# Patient Record
Sex: Female | Born: 1980 | Race: Black or African American | Hispanic: No | Marital: Married | Smoking: Never smoker
Health system: Southern US, Community
[De-identification: ages and names within clinical notes are randomized; demographics above are authoritative.]

## PROBLEM LIST (undated history)

## (undated) DIAGNOSIS — I10 Essential (primary) hypertension: Secondary | ICD-10-CM

## (undated) DIAGNOSIS — M069 Rheumatoid arthritis, unspecified: Secondary | ICD-10-CM

## (undated) DIAGNOSIS — R531 Weakness: Secondary | ICD-10-CM

## (undated) DIAGNOSIS — M329 Systemic lupus erythematosus, unspecified: Secondary | ICD-10-CM

## (undated) DIAGNOSIS — M0579 Rheumatoid arthritis with rheumatoid factor of multiple sites without organ or systems involvement: Secondary | ICD-10-CM

## (undated) DIAGNOSIS — I2699 Other pulmonary embolism without acute cor pulmonale: Secondary | ICD-10-CM

## (undated) DIAGNOSIS — F329 Major depressive disorder, single episode, unspecified: Secondary | ICD-10-CM

## (undated) DIAGNOSIS — I729 Aneurysm of unspecified site: Secondary | ICD-10-CM

## (undated) DIAGNOSIS — S065XAA Traumatic subdural hemorrhage with loss of consciousness status unknown, initial encounter: Secondary | ICD-10-CM

## (undated) DIAGNOSIS — G43909 Migraine, unspecified, not intractable, without status migrainosus: Secondary | ICD-10-CM

## (undated) DIAGNOSIS — Z8679 Personal history of other diseases of the circulatory system: Secondary | ICD-10-CM

## (undated) DIAGNOSIS — M797 Fibromyalgia: Secondary | ICD-10-CM

## (undated) DIAGNOSIS — D649 Anemia, unspecified: Secondary | ICD-10-CM

## (undated) DIAGNOSIS — M1990 Unspecified osteoarthritis, unspecified site: Secondary | ICD-10-CM

## (undated) HISTORY — DX: Aneurysm of unspecified site: I72.9

## (undated) HISTORY — PX: CHOLECYSTECTOMY: SHX55

## (undated) HISTORY — DX: Personal history of other diseases of the circulatory system: Z86.79

## (undated) HISTORY — DX: Rheumatoid arthritis with rheumatoid factor of multiple sites without organ or systems involvement: M05.79

## (undated) HISTORY — PX: BRAIN SURGERY: SHX531

## (undated) HISTORY — DX: Major depressive disorder, single episode, unspecified: F32.9

## (undated) HISTORY — DX: Weakness: R53.1

## (undated) HISTORY — DX: Rheumatoid arthritis, unspecified: M06.9

## (undated) HISTORY — DX: Traumatic subdural hemorrhage with loss of consciousness status unknown, initial encounter: S06.5XAA

## (undated) HISTORY — PX: ABLATION: SHX5711

## (undated) HISTORY — PX: DILATION AND CURETTAGE OF UTERUS: SHX78

## (undated) HISTORY — PX: TUBAL LIGATION: SHX77

## (undated) HISTORY — DX: Migraine, unspecified, not intractable, without status migrainosus: G43.909

---

## 2003-05-25 ENCOUNTER — Inpatient Hospital Stay (HOSPITAL_COMMUNITY): Admission: AD | Admit: 2003-05-25 | Discharge: 2003-05-25 | Payer: Self-pay | Admitting: Family Medicine

## 2003-05-27 ENCOUNTER — Inpatient Hospital Stay (HOSPITAL_COMMUNITY): Admission: AD | Admit: 2003-05-27 | Discharge: 2003-05-27 | Payer: Self-pay | Admitting: Obstetrics and Gynecology

## 2003-06-08 ENCOUNTER — Inpatient Hospital Stay (HOSPITAL_COMMUNITY): Admission: AD | Admit: 2003-06-08 | Discharge: 2003-06-08 | Payer: Self-pay | Admitting: *Deleted

## 2007-02-28 DIAGNOSIS — I2699 Other pulmonary embolism without acute cor pulmonale: Secondary | ICD-10-CM

## 2007-02-28 HISTORY — DX: Other pulmonary embolism without acute cor pulmonale: I26.99

## 2008-03-30 DIAGNOSIS — I10 Essential (primary) hypertension: Secondary | ICD-10-CM

## 2008-03-30 DIAGNOSIS — M329 Systemic lupus erythematosus, unspecified: Secondary | ICD-10-CM

## 2008-03-30 HISTORY — DX: Systemic lupus erythematosus, unspecified: M32.9

## 2008-03-30 HISTORY — DX: Essential (primary) hypertension: I10

## 2008-04-27 DIAGNOSIS — F5101 Primary insomnia: Secondary | ICD-10-CM

## 2008-04-27 HISTORY — DX: Primary insomnia: F51.01

## 2008-12-20 ENCOUNTER — Encounter: Admission: RE | Admit: 2008-12-20 | Discharge: 2008-12-20 | Payer: Self-pay | Admitting: Gastroenterology

## 2009-01-11 ENCOUNTER — Ambulatory Visit (HOSPITAL_COMMUNITY): Admission: RE | Admit: 2009-01-11 | Discharge: 2009-01-11 | Payer: Self-pay | Admitting: General Surgery

## 2009-01-29 ENCOUNTER — Inpatient Hospital Stay (HOSPITAL_COMMUNITY): Admission: EM | Admit: 2009-01-29 | Discharge: 2009-02-12 | Payer: Self-pay | Admitting: Emergency Medicine

## 2009-02-01 ENCOUNTER — Encounter (INDEPENDENT_AMBULATORY_CARE_PROVIDER_SITE_OTHER): Payer: Self-pay

## 2009-02-15 DIAGNOSIS — D62 Acute posthemorrhagic anemia: Secondary | ICD-10-CM

## 2009-02-15 DIAGNOSIS — D6869 Other thrombophilia: Secondary | ICD-10-CM | POA: Insufficient documentation

## 2009-02-15 DIAGNOSIS — Z9049 Acquired absence of other specified parts of digestive tract: Secondary | ICD-10-CM

## 2009-02-15 HISTORY — DX: Acquired absence of other specified parts of digestive tract: Z90.49

## 2009-02-15 HISTORY — DX: Other thrombophilia: D68.69

## 2009-02-15 HISTORY — DX: Acute posthemorrhagic anemia: D62

## 2009-03-05 DIAGNOSIS — I2699 Other pulmonary embolism without acute cor pulmonale: Secondary | ICD-10-CM | POA: Diagnosis present

## 2009-03-05 DIAGNOSIS — R748 Abnormal levels of other serum enzymes: Secondary | ICD-10-CM | POA: Insufficient documentation

## 2009-03-05 HISTORY — DX: Other pulmonary embolism without acute cor pulmonale: I26.99

## 2009-03-05 HISTORY — DX: Abnormal levels of other serum enzymes: R74.8

## 2009-03-12 ENCOUNTER — Observation Stay (HOSPITAL_COMMUNITY): Admission: EM | Admit: 2009-03-12 | Discharge: 2009-03-15 | Payer: Self-pay | Admitting: Emergency Medicine

## 2009-05-07 ENCOUNTER — Encounter: Admission: RE | Admit: 2009-05-07 | Discharge: 2009-05-07 | Payer: Self-pay | Admitting: General Surgery

## 2010-01-07 ENCOUNTER — Encounter
Admission: RE | Admit: 2010-01-07 | Discharge: 2010-01-07 | Payer: Self-pay | Admitting: Physical Medicine and Rehabilitation

## 2010-05-15 LAB — URINALYSIS, ROUTINE W REFLEX MICROSCOPIC
Glucose, UA: NEGATIVE mg/dL
Leukocytes, UA: NEGATIVE
Nitrite: NEGATIVE
Urobilinogen, UA: 0.2 mg/dL (ref 0.0–1.0)
pH: 6 (ref 5.0–8.0)

## 2010-05-15 LAB — PROTIME-INR
INR: 5.33 (ref 0.00–1.49)
Prothrombin Time: 48.4 seconds — ABNORMAL HIGH (ref 11.6–15.2)

## 2010-05-15 LAB — COMPREHENSIVE METABOLIC PANEL
ALT: 45 U/L — ABNORMAL HIGH (ref 0–35)
Alkaline Phosphatase: 96 U/L (ref 39–117)
CO2: 26 mEq/L (ref 19–32)
Chloride: 98 mEq/L (ref 96–112)
GFR calc non Af Amer: >60 mL/min (ref >60)
Glucose, Bld: 110 mg/dL — ABNORMAL HIGH (ref 70–99)
Potassium: 3.9 mEq/L (ref 3.5–5.1)
Sodium: 136 mEq/L (ref 135–145)
Total Bilirubin: 0.2 mg/dL — ABNORMAL LOW (ref 0.3–1.2)

## 2010-05-15 LAB — CBC
HCT: 33.5 % — ABNORMAL LOW (ref 36.0–46.0)
HCT: 37.9 % (ref 36.0–46.0)
Hemoglobin: 12.5 g/dL (ref 12.0–15.0)
MCHC: 33 g/dL (ref 30.0–36.0)
MCV: 83.9 fL (ref 78.0–100.0)
MCV: 84.6 fL (ref 78.0–100.0)
Platelets: 196 10*3/uL (ref 150–400)
Platelets: 238 K/uL (ref 150–400)
RBC: 4.48 MIL/uL (ref 3.87–5.11)
RDW: 15.8 % — ABNORMAL HIGH (ref 11.5–15.5)
RDW: 16.1 % — ABNORMAL HIGH (ref 11.5–15.5)
WBC: 10.7 K/uL — ABNORMAL HIGH (ref 4.0–10.5)

## 2010-05-15 LAB — DIFFERENTIAL
Basophils Relative: 2 % — ABNORMAL HIGH (ref 0–1)
Eosinophils Absolute: 0.1 10*3/uL (ref 0.0–0.7)
Eosinophils Relative: 1 % (ref 0–5)
Neutrophils Relative %: 67 % (ref 43–77)

## 2010-05-15 LAB — PREPARE FRESH FROZEN PLASMA

## 2010-05-15 LAB — BASIC METABOLIC PANEL WITH GFR
BUN: 4 mg/dL — ABNORMAL LOW (ref 6–23)
CO2: 25 meq/L (ref 19–32)
Calcium: 8.2 mg/dL — ABNORMAL LOW (ref 8.4–10.5)
Chloride: 101 meq/L (ref 96–112)
Creatinine, Ser: 0.71 mg/dL (ref 0.4–1.2)
GFR calc non Af Amer: >60 mL/min
Glucose, Bld: 105 mg/dL — ABNORMAL HIGH (ref 70–99)
Potassium: 4 meq/L (ref 3.5–5.1)
Sodium: 135 meq/L (ref 135–145)

## 2010-05-15 LAB — APTT

## 2010-05-15 LAB — URINE MICROSCOPIC-ADD ON

## 2010-05-16 LAB — CBC
HCT: 36.1 % (ref 36.0–46.0)
Platelets: 207 10*3/uL (ref 150–400)
RDW: 16.2 % — ABNORMAL HIGH (ref 11.5–15.5)
WBC: 7.7 10*3/uL (ref 4.0–10.5)

## 2010-05-16 LAB — PROTIME-INR
INR: 2.26 — ABNORMAL HIGH (ref 0.00–1.49)
Prothrombin Time: 24.8 seconds — ABNORMAL HIGH (ref 11.6–15.2)
Prothrombin Time: 24.8 seconds — ABNORMAL HIGH (ref 11.6–15.2)

## 2010-05-30 LAB — BASIC METABOLIC PANEL
BUN: 6 mg/dL (ref 6–23)
CO2: 28 mEq/L (ref 19–32)
Calcium: 8.8 mg/dL (ref 8.4–10.5)
Chloride: 100 mEq/L (ref 96–112)
Creatinine, Ser: 0.98 mg/dL (ref 0.4–1.2)
GFR calc Af Amer: >60 mL/min (ref >60)

## 2010-05-30 LAB — PROTIME-INR
INR: 1.1 (ref 0.00–1.49)
INR: 1.18 (ref 0.00–1.49)
Prothrombin Time: 14.9 seconds (ref 11.6–15.2)

## 2010-05-30 LAB — CBC
Hemoglobin: 10.9 g/dL — ABNORMAL LOW (ref 12.0–15.0)
MCHC: 33.6 g/dL (ref 30.0–36.0)
MCHC: 34 g/dL (ref 30.0–36.0)
MCV: 85.4 fL (ref 78.0–100.0)
Platelets: 286 10*3/uL (ref 150–400)
Platelets: 299 10*3/uL (ref 150–400)
RDW: 15 % (ref 11.5–15.5)
WBC: 11.7 10*3/uL — ABNORMAL HIGH (ref 4.0–10.5)

## 2010-05-31 LAB — COMPREHENSIVE METABOLIC PANEL
ALT: 63 U/L — ABNORMAL HIGH (ref 0–35)
ALT: 72 U/L — ABNORMAL HIGH (ref 0–35)
ALT: 77 U/L — ABNORMAL HIGH (ref 0–35)
AST: 47 U/L — ABNORMAL HIGH (ref 0–37)
AST: 54 U/L — ABNORMAL HIGH (ref 0–37)
AST: 65 U/L — ABNORMAL HIGH (ref 0–37)
AST: 65 U/L — ABNORMAL HIGH (ref 0–37)
Albumin: 2.8 g/dL — ABNORMAL LOW (ref 3.5–5.2)
Albumin: 2.9 g/dL — ABNORMAL LOW (ref 3.5–5.2)
Albumin: 3.8 g/dL (ref 3.5–5.2)
Alkaline Phosphatase: 61 U/L (ref 39–117)
Alkaline Phosphatase: 66 U/L (ref 39–117)
Alkaline Phosphatase: 67 U/L (ref 39–117)
BUN: 12 mg/dL (ref 6–23)
BUN: 4 mg/dL — ABNORMAL LOW (ref 6–23)
CO2: 23 mEq/L (ref 19–32)
CO2: 25 mEq/L (ref 19–32)
CO2: 28 mEq/L (ref 19–32)
CO2: 29 mEq/L (ref 19–32)
Calcium: 7.8 mg/dL — ABNORMAL LOW (ref 8.4–10.5)
Calcium: 7.9 mg/dL — ABNORMAL LOW (ref 8.4–10.5)
Calcium: 8.6 mg/dL (ref 8.4–10.5)
Chloride: 100 mEq/L (ref 96–112)
Chloride: 101 mEq/L (ref 96–112)
Chloride: 98 mEq/L (ref 96–112)
Creatinine, Ser: 0.83 mg/dL (ref 0.4–1.2)
Creatinine, Ser: 0.93 mg/dL (ref 0.4–1.2)
Creatinine, Ser: 0.97 mg/dL (ref 0.4–1.2)
Creatinine, Ser: 1.03 mg/dL (ref 0.4–1.2)
GFR calc Af Amer: >60 mL/min (ref >60)
GFR calc Af Amer: >60 mL/min (ref >60)
GFR calc Af Amer: >60 mL/min (ref >60)
GFR calc non Af Amer: >60 mL/min (ref >60)
GFR calc non Af Amer: >60 mL/min (ref >60)
GFR calc non Af Amer: >60 mL/min (ref >60)
GFR calc non Af Amer: >60 mL/min (ref >60)
Glucose, Bld: 101 mg/dL — ABNORMAL HIGH (ref 70–99)
Potassium: 3.7 mEq/L (ref 3.5–5.1)
Potassium: 4.1 mEq/L (ref 3.5–5.1)
Potassium: 4.9 mEq/L (ref 3.5–5.1)
Sodium: 130 mEq/L — ABNORMAL LOW (ref 135–145)
Total Bilirubin: 0.4 mg/dL (ref 0.3–1.2)
Total Bilirubin: 0.4 mg/dL (ref 0.3–1.2)
Total Bilirubin: 0.7 mg/dL (ref 0.3–1.2)
Total Protein: 6.4 g/dL (ref 6.0–8.3)
Total Protein: 7.8 g/dL (ref 6.0–8.3)

## 2010-05-31 LAB — CBC
HCT: 22.4 % — ABNORMAL LOW (ref 36.0–46.0)
HCT: 23.5 % — ABNORMAL LOW (ref 36.0–46.0)
HCT: 29.1 % — ABNORMAL LOW (ref 36.0–46.0)
HCT: 31.9 % — ABNORMAL LOW (ref 36.0–46.0)
HCT: 33.1 % — ABNORMAL LOW (ref 36.0–46.0)
HCT: 33.8 % — ABNORMAL LOW (ref 36.0–46.0)
Hemoglobin: 10.7 g/dL — ABNORMAL LOW (ref 12.0–15.0)
Hemoglobin: 11.2 g/dL — ABNORMAL LOW (ref 12.0–15.0)
Hemoglobin: 7.3 g/dL — ABNORMAL LOW (ref 12.0–15.0)
Hemoglobin: 7.4 g/dL — ABNORMAL LOW (ref 12.0–15.0)
Hemoglobin: 7.9 g/dL — ABNORMAL LOW (ref 12.0–15.0)
Hemoglobin: 9.7 g/dL — ABNORMAL LOW (ref 12.0–15.0)
Hemoglobin: 9.9 g/dL — ABNORMAL LOW (ref 12.0–15.0)
MCHC: 33.1 g/dL (ref 30.0–36.0)
MCHC: 33.2 g/dL (ref 30.0–36.0)
MCHC: 33.5 g/dL (ref 30.0–36.0)
MCHC: 34.4 g/dL (ref 30.0–36.0)
MCV: 81.3 fL (ref 78.0–100.0)
MCV: 81.6 fL (ref 78.0–100.0)
MCV: 82.2 fL (ref 78.0–100.0)
MCV: 82.3 fL (ref 78.0–100.0)
MCV: 85.4 fL (ref 78.0–100.0)
MCV: 85.7 fL (ref 78.0–100.0)
MCV: 86 fL (ref 78.0–100.0)
Platelets: 148 10*3/uL — ABNORMAL LOW (ref 150–400)
Platelets: 161 10*3/uL (ref 150–400)
Platelets: 186 10*3/uL (ref 150–400)
Platelets: 207 10*3/uL (ref 150–400)
Platelets: 211 10*3/uL (ref 150–400)
Platelets: 241 10*3/uL (ref 150–400)
RBC: 3.65 MIL/uL — ABNORMAL LOW (ref 3.87–5.11)
RBC: 3.7 MIL/uL — ABNORMAL LOW (ref 3.87–5.11)
RBC: 4.06 MIL/uL (ref 3.87–5.11)
RBC: 4.15 MIL/uL (ref 3.87–5.11)
RDW: 15 % (ref 11.5–15.5)
RDW: 15.1 % (ref 11.5–15.5)
RDW: 15.2 % (ref 11.5–15.5)
RDW: 15.4 % (ref 11.5–15.5)
WBC: 11 10*3/uL — ABNORMAL HIGH (ref 4.0–10.5)
WBC: 12.7 10*3/uL — ABNORMAL HIGH (ref 4.0–10.5)
WBC: 7.1 10*3/uL (ref 4.0–10.5)
WBC: 8.4 10*3/uL (ref 4.0–10.5)

## 2010-05-31 LAB — DIFFERENTIAL
Basophils Absolute: 0.1 10*3/uL (ref 0.0–0.1)
Basophils Relative: 1 % (ref 0–1)
Eosinophils Absolute: 0.4 10*3/uL (ref 0.0–0.7)
Eosinophils Relative: 4 % (ref 0–5)
Lymphocytes Relative: 18 % (ref 12–46)
Lymphs Abs: 2 10*3/uL (ref 0.7–4.0)
Monocytes Absolute: 0.7 10*3/uL (ref 0.1–1.0)
Monocytes Relative: 9 % (ref 3–12)
Neutro Abs: 5.1 10*3/uL (ref 1.7–7.7)
Neutrophils Relative %: 61 % (ref 43–77)
Neutrophils Relative %: 67 % (ref 43–77)

## 2010-05-31 LAB — URINE CULTURE
Colony Count: NO GROWTH
Culture: NO GROWTH

## 2010-05-31 LAB — URINALYSIS, MICROSCOPIC ONLY
Glucose, UA: NEGATIVE mg/dL
Hgb urine dipstick: NEGATIVE
Ketones, ur: NEGATIVE mg/dL
Leukocytes, UA: NEGATIVE
Protein, ur: NEGATIVE mg/dL
Protein, ur: NEGATIVE mg/dL
Urobilinogen, UA: 1 mg/dL (ref 0.0–1.0)
pH: 7.5 (ref 5.0–8.0)

## 2010-05-31 LAB — PROTIME-INR
INR: 1.09 (ref 0.00–1.49)
INR: 1.22 (ref 0.00–1.49)
INR: 1.32 (ref 0.00–1.49)
INR: 2.17 — ABNORMAL HIGH (ref 0.00–1.49)
INR: 3.53 — ABNORMAL HIGH (ref 0.00–1.49)
Prothrombin Time: 15.2 seconds (ref 11.6–15.2)
Prothrombin Time: 15.3 seconds — ABNORMAL HIGH (ref 11.6–15.2)
Prothrombin Time: 16.3 seconds — ABNORMAL HIGH (ref 11.6–15.2)
Prothrombin Time: 16.9 seconds — ABNORMAL HIGH (ref 11.6–15.2)

## 2010-05-31 LAB — CLOSTRIDIUM DIFFICILE EIA

## 2010-05-31 LAB — BASIC METABOLIC PANEL
BUN: 3 mg/dL — ABNORMAL LOW (ref 6–23)
BUN: 3 mg/dL — ABNORMAL LOW (ref 6–23)
CO2: 27 mEq/L (ref 19–32)
Calcium: 8.7 mg/dL (ref 8.4–10.5)
Calcium: 8.7 mg/dL (ref 8.4–10.5)
Chloride: 100 mEq/L (ref 96–112)
Creatinine, Ser: 0.92 mg/dL (ref 0.4–1.2)
GFR calc non Af Amer: >60 mL/min (ref >60)
Glucose, Bld: 161 mg/dL — ABNORMAL HIGH (ref 70–99)

## 2010-05-31 LAB — HEMOGLOBIN AND HEMATOCRIT, BLOOD
HCT: 22.7 % — ABNORMAL LOW (ref 36.0–46.0)
HCT: 24.6 % — ABNORMAL LOW (ref 36.0–46.0)
HCT: 28.2 % — ABNORMAL LOW (ref 36.0–46.0)
HCT: 28.4 % — ABNORMAL LOW (ref 36.0–46.0)
HCT: 28.7 % — ABNORMAL LOW (ref 36.0–46.0)
HCT: 31 % — ABNORMAL LOW (ref 36.0–46.0)
Hemoglobin: 10.1 g/dL — ABNORMAL LOW (ref 12.0–15.0)
Hemoglobin: 10.2 g/dL — ABNORMAL LOW (ref 12.0–15.0)
Hemoglobin: 10.5 g/dL — ABNORMAL LOW (ref 12.0–15.0)
Hemoglobin: 8.3 g/dL — ABNORMAL LOW (ref 12.0–15.0)

## 2010-05-31 LAB — CROSSMATCH

## 2010-05-31 LAB — URINALYSIS, ROUTINE W REFLEX MICROSCOPIC
Bilirubin Urine: NEGATIVE
Nitrite: NEGATIVE
Protein, ur: NEGATIVE mg/dL
Urobilinogen, UA: 0.2 mg/dL (ref 0.0–1.0)

## 2010-05-31 LAB — LIPASE, BLOOD: Lipase: 25 U/L (ref 11–59)

## 2010-05-31 LAB — AMYLASE: Amylase: 92 U/L (ref 0–105)

## 2010-07-05 ENCOUNTER — Other Ambulatory Visit: Payer: Self-pay | Admitting: Medical

## 2011-03-31 ENCOUNTER — Other Ambulatory Visit: Payer: Self-pay | Admitting: Obstetrics and Gynecology

## 2011-03-31 ENCOUNTER — Other Ambulatory Visit (HOSPITAL_COMMUNITY)
Admission: RE | Admit: 2011-03-31 | Discharge: 2011-03-31 | Disposition: A | Discharge status: Home / Self Care | Payer: BC Managed Care – PPO | Source: Ambulatory Visit | Attending: Obstetrics and Gynecology | Admitting: Obstetrics and Gynecology

## 2011-03-31 DIAGNOSIS — Z01419 Encounter for gynecological examination (general) (routine) without abnormal findings: Secondary | ICD-10-CM | POA: Insufficient documentation

## 2011-03-31 DIAGNOSIS — Z113 Encounter for screening for infections with a predominantly sexual mode of transmission: Secondary | ICD-10-CM | POA: Insufficient documentation

## 2011-04-22 IMAGING — NM NM LIVER FUNCTION STUDY
1 series · 6 of 6 positions shown · non-contrast
Comparison: MRI examination 12/20/2008

CLINICAL DATA: Right upper quadrant pain

NUCLEAR MEDICINE HEPATOBILIARY IMAGING
TECHNIQUE: Sequential images of the abdomen were obtained [DATE] minutes following intravenous administration of
radiopharmaceutical.
Radiopharmaceutical:  5.0 mCi Gc-88m Choletec
Gallbladder ejection fraction was calculated after readministration
of the 1.5 mCi Choletec and administration of 1.8 mcg CCK. Post CCK
the patient presented abdominal pressure in the right upper
quadrant subsided.

[he hepatobiliary · 3.43mm/px · 6 of 60 frames shown]
[frame 6/60]
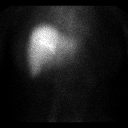
[frame 16/60]
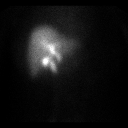
[frame 26/60]
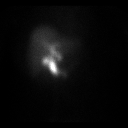
[frame 36/60]
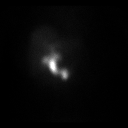
[frame 46/60]
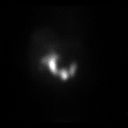
[frame 56/60]
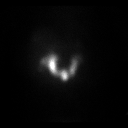

[6 of 6 positions shown; findings below may reference images not displayed]

FINDINGS: There is normal uptake of the tracer by the liver.
Gallbladder is visualized at 15 minutes.  CBD is visualized at 20
minutes.  Post CCK gallbladder ejection fraction is 21.1%.  Normal
gallbladder ejection fraction should be greater than 30%.
IMPRESSION: No cystic duct obstruction.  Post CCK gallbladder ejection fraction
is only 21.1%.  Normal gallbladder ejection fraction should be
greater than 30%.

## 2011-05-04 ENCOUNTER — Other Ambulatory Visit: Payer: Self-pay

## 2011-05-04 ENCOUNTER — Emergency Department (HOSPITAL_COMMUNITY): Payer: BC Managed Care – PPO

## 2011-05-04 ENCOUNTER — Emergency Department (HOSPITAL_COMMUNITY)
Admission: EM | Admit: 2011-05-04 | Discharge: 2011-05-04 | Disposition: A | Discharge status: Home / Self Care | Payer: BC Managed Care – PPO | Attending: Emergency Medicine | Admitting: Emergency Medicine

## 2011-05-04 ENCOUNTER — Encounter (HOSPITAL_COMMUNITY): Payer: Self-pay | Admitting: *Deleted

## 2011-05-04 DIAGNOSIS — R079 Chest pain, unspecified: Secondary | ICD-10-CM | POA: Insufficient documentation

## 2011-05-04 DIAGNOSIS — R0602 Shortness of breath: Secondary | ICD-10-CM | POA: Insufficient documentation

## 2011-05-04 DIAGNOSIS — R232 Flushing: Secondary | ICD-10-CM | POA: Insufficient documentation

## 2011-05-04 DIAGNOSIS — M940 Chondrocostal junction syndrome [Tietze]: Secondary | ICD-10-CM

## 2011-05-04 DIAGNOSIS — M329 Systemic lupus erythematosus, unspecified: Secondary | ICD-10-CM | POA: Insufficient documentation

## 2011-05-04 DIAGNOSIS — M25519 Pain in unspecified shoulder: Secondary | ICD-10-CM | POA: Insufficient documentation

## 2011-05-04 DIAGNOSIS — I10 Essential (primary) hypertension: Secondary | ICD-10-CM | POA: Insufficient documentation

## 2011-05-04 DIAGNOSIS — Z86718 Personal history of other venous thrombosis and embolism: Secondary | ICD-10-CM | POA: Insufficient documentation

## 2011-05-04 DIAGNOSIS — R059 Cough, unspecified: Secondary | ICD-10-CM | POA: Insufficient documentation

## 2011-05-04 DIAGNOSIS — Z79899 Other long term (current) drug therapy: Secondary | ICD-10-CM | POA: Insufficient documentation

## 2011-05-04 DIAGNOSIS — M069 Rheumatoid arthritis, unspecified: Secondary | ICD-10-CM | POA: Insufficient documentation

## 2011-05-04 DIAGNOSIS — Z7901 Long term (current) use of anticoagulants: Secondary | ICD-10-CM | POA: Insufficient documentation

## 2011-05-04 DIAGNOSIS — R05 Cough: Secondary | ICD-10-CM | POA: Insufficient documentation

## 2011-05-04 HISTORY — DX: Systemic lupus erythematosus, unspecified: M32.9

## 2011-05-04 HISTORY — DX: Other pulmonary embolism without acute cor pulmonale: I26.99

## 2011-05-04 HISTORY — DX: Unspecified osteoarthritis, unspecified site: M19.90

## 2011-05-04 HISTORY — DX: Essential (primary) hypertension: I10

## 2011-05-04 HISTORY — DX: Fibromyalgia: M79.7

## 2011-05-04 LAB — DIFFERENTIAL
Basophils Absolute: 0 10*3/uL (ref 0.0–0.1)
Basophils Relative: 0 % (ref 0–1)
Eosinophils Relative: 4 % (ref 0–5)
Lymphocytes Relative: 30 % (ref 12–46)

## 2011-05-04 LAB — BASIC METABOLIC PANEL
BUN: 7 mg/dL (ref 6–23)
Chloride: 100 mEq/L (ref 96–112)
Creatinine, Ser: 1.06 mg/dL (ref 0.50–1.10)
Glucose, Bld: 107 mg/dL — ABNORMAL HIGH (ref 70–99)
Potassium: 4.1 mEq/L (ref 3.5–5.1)

## 2011-05-04 LAB — CBC
MCHC: 32.1 g/dL (ref 30.0–36.0)
MCV: 79.7 fL (ref 78.0–100.0)
Platelets: 255 10*3/uL (ref 150–400)
RDW: 15.9 % — ABNORMAL HIGH (ref 11.5–15.5)
WBC: 8.8 10*3/uL (ref 4.0–10.5)

## 2011-05-04 LAB — APTT: aPTT: 43 seconds — ABNORMAL HIGH (ref 24–37)

## 2011-05-04 LAB — PROTIME-INR: INR: 2.54 — ABNORMAL HIGH (ref 0.00–1.49)

## 2011-05-04 LAB — POCT I-STAT TROPONIN I: Troponin i, poc: 0 ng/mL (ref 0.00–0.08)

## 2011-05-04 MED ORDER — PROMETHAZINE HCL 25 MG/ML IJ SOLN
25.0000 mg | Freq: Once | INTRAMUSCULAR | Status: AC
  Administered 2011-05-04: 25 mg via INTRAVENOUS
  Filled 2011-05-04: qty 1

## 2011-05-04 MED ORDER — OXYCODONE-ACETAMINOPHEN 5-325 MG PO TABS: 2.0000 | ORAL_TABLET | ORAL | Status: AC | PRN

## 2011-05-04 MED ORDER — ASPIRIN 81 MG PO CHEW
324.0000 mg | CHEWABLE_TABLET | Freq: Once | ORAL | Status: AC
  Administered 2011-05-04: 324 mg via ORAL
  Filled 2011-05-04: qty 4

## 2011-05-04 MED ORDER — DIPHENHYDRAMINE HCL 50 MG/ML IJ SOLN
25.0000 mg | Freq: Once | INTRAMUSCULAR | Status: AC
  Administered 2011-05-04: 25 mg via INTRAVENOUS
  Filled 2011-05-04: qty 1

## 2011-05-04 MED ORDER — HYDROMORPHONE HCL PF 1 MG/ML IJ SOLN
1.0000 mg | Freq: Once | INTRAMUSCULAR | Status: AC
  Administered 2011-05-04: 1 mg via INTRAVENOUS
  Filled 2011-05-04: qty 1

## 2011-05-04 MED ORDER — HYDROMORPHONE HCL PF 2 MG/ML IJ SOLN
2.0000 mg | Freq: Once | INTRAMUSCULAR | Status: AC
  Administered 2011-05-04: 2 mg via INTRAVENOUS
  Filled 2011-05-04: qty 1

## 2011-05-04 NOTE — ED Notes (Signed)
Dr. Fredricka Bonine reviewed ekg.

## 2011-05-04 NOTE — ED Notes (Signed)
Urine sample collected if needed. 

## 2011-05-04 NOTE — ED Notes (Signed)
Pt states she is currently being treated for URI with abx. X 1 week

## 2011-05-04 NOTE — ED Provider Notes (Addendum)
History     CSN: [id number]  Arrival date & time 05/04/11  1453   First MD Initiated Contact with Patient 05/04/11 1501      Chief Complaint  Patient presents with  . Chest Pain    [facility] when she started having CP.  Pt. staes she "got hot and chort of breath"    (Consider location/radiation/quality/duration/timing/severity/associated sxs/prior treatment) HPI Comments: The patient is a 31 year old female with a past medical history significant for pulmonary embolism in 2009 for which she has subsequently been on warfarin, lupus, rheumatoid arthritis, hypertension, otherwise she is a nonsmoker, not taking oral contraceptive pills, and without a family history significant for early coronary artery disease. She reports that she was at a followup appointment at the orthopedic clinic for her rheumatoid arthritis when she developed acute onset of centralized chest pain is described as sharp and tight, nonradiating, but with associated right shoulder discomfort, perceived shortness of breath, flushing, but no nausea, vomiting, palpitations, headache, syncope, or near-syncope. She reports that the chest discomfort was 10 out of 10 in intensity at its onset and currently is 9/10 in intensity. She appears to be in no acute distress. She denies any recent swelling or tenderness of her calves or lower extremities, denies any recent surgical procedure or immobilization. She did have a upper respiratory tract infection over the last week for which she is taking doxycycline and states she has been coughing a lot. Coughing, as well as palpation of the costosternal joints reproduces and augments the chest pain that she is feeling. She has no known history of coronary artery disease. Currently her oxygen saturation is 100% on room air, she has no tachypnea, is not tachycardic, and has a normal and stable control blood pressure.  The history is provided  by the patient and the EMS personnel.    No past medical history on file.  No past surgical history on file.  No family history on file.  History  Substance Use Topics  . Smoking status: Not on file  . Smokeless tobacco: Not on file  . Alcohol Use: Not on file    OB History    No data available      Review of Systems  All other systems reviewed and are negative.    Allergies  Compazine; Macrobid; and Penicillins  Home Medications  No current outpatient prescriptions on file.  BP 134/81  Pulse 95  Temp(Src) 99 F (37.2 C) (Oral)  Resp 22  SpO2 100%  Physical Exam  Nursing note and vitals reviewed. Constitutional: She is oriented to person, place, and time. She appears well-developed and well-nourished. No distress.  HENT:  Head: Normocephalic and atraumatic.  Mouth/Throat: Oropharynx is clear and moist.  Eyes: EOM are normal. Pupils are equal, round, and reactive to light.  Neck: Normal range of motion. Neck supple. No JVD present. No tracheal deviation present.  Cardiovascular: Normal rate, regular rhythm, S1 normal, S2 normal, normal heart sounds and intact distal pulses.   No extrasystoles are present. PMI is not displaced.  Exam reveals no gallop and no friction rub.   No murmur heard. Pulmonary/Chest: Effort normal and breath sounds normal. No accessory muscle usage or stridor. Not tachypneic. No respiratory distress. She has no decreased breath sounds. She has no wheezes. She has no rhonchi. She has no rales. She exhibits tenderness and bony tenderness. She exhibits no crepitus and no retraction.    Abdominal: Soft. Bowel  sounds are normal. She exhibits no distension and no mass. There is no tenderness. There is no rebound and no guarding.  Musculoskeletal: Normal range of motion. She exhibits no edema and no tenderness.  Neurological: She is alert and oriented to person, place, and time. No cranial nerve deficit. She exhibits normal muscle tone.  Skin:  Skin is warm and dry. No rash noted. She is not diaphoretic. No erythema. No pallor.  Psychiatric: She has a normal mood and affect. Her behavior is normal. Judgment and thought content normal.    ED Course  Procedures (including critical care time)   Date: 05/04/2011  Rate: 89  Rhythm: normal sinus rhythm  QRS Axis: normal  Intervals: normal  ST/T Wave abnormalities: nonspecific T wave changes and diffuse flattened t waves  Conduction Disutrbances:none  Narrative Interpretation: Non-provocative EKG  Old EKG Reviewed: No significant changes    Labs Reviewed - No data to display No results found.   No diagnosis found.    MDM  Musculoskeletal chest pain, costochondritis, GERD, Gastrointestinal Chest Pain, Pleuritic Chest Pain, Pneumonia, Pneumothorax, Pulmonary Embolism, Esophageal Spasm, Arrhythmia considered among other potential etiologies in the patient's differential diagnosis.  Pulmonary embolism is the primary concern based on patient's symptomatic onset and her personal medical history. Physical examination and findings, and do not suggest this diagnosis however. As mentioned above, her oxygenation is 100% on room air, she is not tachypneic, she is not tachycardic, and she has a normal, stable, control blood pressure. Her chest pain is reproducible with palpation of the costosternal joints, and with her history of recent upper respiratory infection and frequent coughing, costochondritis is thought to be the most likely etiology. As the patient has no clinical symptoms of DVT, has a controlled heart rate, has not had immobilization or surgery within the last 4 weeks, has not had hemoptysis, has no history of malignancy, and considering that she did have a prior pulmonary embolism, she still is in the low risk group for PE by the Wells criteria and does qualify for exclusion of pulmonary embolism via a negative d-dimer. I will order a d-dimer to initiate her evaluation to exclude  pulmonary embolism.  ACS, MI, Unstable Angina thought much less likely as etiologies based on lack of risk factors for CAD, atypical symptoms, non-provocative physical examination, however, I will evaluate for myocardial injury or ischemia.        Felisa Bonier, MD 05/04/11 1548  6:22 PM The patient is in no distress, awake, alert, and oriented appropriately, with no respiratory distress, stable vital signs, and her d-dimer is negative for pulmonary embolism. At this time for the [facility] PE criteria the patient is safely excluded from having a pulmonary embolism and no further testing is needed. I do not suspect that this is coronary artery disease or acute myocardial infarction or acute coronary syndrome based on the atypical nature of the symptoms, reproducibility with palpation of the chest wall, and lack of risk factors. I will discharge the patient home.  Felisa Bonier, MD 05/04/11 [phone]

## 2011-05-30 NOTE — Patient Instructions (Signed)
[address] Haley Goodman  05/30/2011   Your procedure is scheduled on:  06/06/2011  Report to [facility] at  615  AM.  Call this number if you have problems the morning of surgery: [phone]   Remember:   Do not eat food:After Midnight.  May have clear liquids:until Midnight .  Clear liquids include soda, tea, black coffee, apple or grape juice, broth.  Take these medicines the morning of surgery with A SIP OF WATER: ativan,flexaril,lisinopril,geodan   Do not wear jewelry, make-up or nail polish.  Do not wear lotions, powders, or perfumes. You may wear deodorant.  Do not shave 48 hours prior to surgery.  Do not bring valuables to the hospital.  Contacts, dentures or bridgework may not be worn into surgery.  Leave suitcase in the car. After surgery it may be brought to your room.  For patients admitted to the hospital, checkout time is 11:00 AM the day of discharge.   Patients discharged the day of surgery will not be allowed to drive home.  Name and phone number of your driver: family  Special Instructions: CHG Shower Use Special Wash: 1/2 bottle night before surgery and 1/2 bottle morning of surgery.   Please read over the following fact sheets that you were given: Pain Booklet, MRSA Information, Surgical Site Infection Prevention, Anesthesia Post-op Instructions and Care and Recovery After Surgery Endometrial Ablation Endometrial ablation removes the lining of the uterus (endometrium). It is usually a same day, outpatient treatment. Ablation helps avoid major surgery (such as a hysterectomy). A hysterectomy is removal of the cervix and uterus. Endometrial ablation has less risk and complications, has a shorter recovery period and is less expensive. After endometrial ablation, most women will have little or no menstrual bleeding. You may not keep your fertility. Pregnancy is no longer likely after this procedure but if you are pre-menopausal, you still need to use a reliable method of birth  control following the procedure because pregnancy can occur. REASONS TO HAVE THE PROCEDURE MAY INCLUDE:  Heavy periods.   Bleeding that is causing anemia.   Anovulatory bleeding, very irregular, bleeding.   Bleeding submucous fibroids (on the lining inside the uterus) if they are smaller than 3 centimeters.  REASONS NOT TO HAVE THE PROCEDURE MAY INCLUDE:  You wish to have more children.   You have a pre-cancerous or cancerous problem. The cause of any abnormal bleeding must be diagnosed before having the procedure.   You have pain coming from the uterus.   You have a submucus fibroid larger than 3 centimeters.   You recently had a baby.   You recently had an infection in the uterus.   You have a severe retro-flexed, tipped uterus and cannot insert the instrument to do the ablation.   You had a Cesarean section or deep major surgery on the uterus.   The inner cavity of the uterus is too large for the endometrial ablation instrument.  RISKS AND COMPLICATIONS   Perforation of the uterus.   Bleeding.   Infection of the uterus, bladder or vagina.   Injury to surrounding organs.   Cutting the cervix.   An air bubble to the lung (air embolus).   Pregnancy following the procedure.   Failure of the procedure to help the problem requiring hysterectomy.   Decreased ability to diagnose cancer in the lining of the uterus.  BEFORE THE PROCEDURE  The lining of the uterus must be tested to make sure there is no pre-cancerous or cancer cells  present.   Medications may be given to make the lining of the uterus thinner.   Ultrasound may be used to evaluate the size and look for abnormalities of the uterus.   Future pregnancy is not desired.  PROCEDURE  There are different ways to destroy the lining of the uterus.   Resectoscope - radio frequency-alternating electric current is the most common one used.   Cryotherapy - freezing the lining of the uterus.   Heated Free  Liquid - heated salt (saline) solution inserted into the uterus.   Microwave - uses high energy microwaves in the uterus.   Thermal Balloon - a catheter with a balloon tip is inserted into the uterus and filled with heated fluid.  Your caregiver will talk with you about the method used in this clinic. They will also instruct you on the pros and cons of the procedure. Endometrial ablation is performed along with a procedure called operative hysteroscopy. A narrow viewing tube is inserted through the birth canal (vagina) and through the cervix into the uterus. A tiny camera attached to the viewing tube (hysteroscope) allows the uterine cavity to be shown on a TV monitor during surgery. Your uterus is filled with a harmless liquid to make the procedure easier. The lining of the uterus is then removed. The lining can also be removed with a resectoscope which allows your surgeon to cut away the lining of the uterus under direct vision. Usually, you will be able to go home within an hour after the procedure. HOME CARE INSTRUCTIONS   Do not drive for 24 hours.   No tampons, douching or intercourse for 2 weeks or until your caregiver approves.   Rest at home for 24 to 48 hours. You may then resume normal activities unless told differently by your caregiver.   Take your temperature two times a day for 4 days, and record it.   Take any medications your caregiver has ordered, as directed.   Use some form of contraception if you are pre-menopausal and do not want to get pregnant.  Bleeding after the procedure is normal. It varies from light spotting and mildly watery to bloody discharge for 4 to 6 weeks. You may also have mild cramping. Only take over-the-counter or prescription medicines for pain, discomfort, or fever as directed by your caregiver. Do not use aspirin, as this may aggravate bleeding. Frequent urination during the first 24 hours is normal. You will not know how effective your surgery is until  at least 3 months after the surgery. SEEK IMMEDIATE MEDICAL CARE IF:   Bleeding is heavier than a normal menstrual cycle.   An oral temperature above 102 F (38.9 C) develops.   You have increasing cramps or pains not relieved with medication or develop belly (abdominal) pain which does not seem to be related to the same area of earlier cramping and pain.   You are light headed, weak or have fainting episodes.   You develop pain in the shoulder strap areas.   You have chest or leg pain.   You have abnormal vaginal discharge.   You have painful urination.  Document Released: 12/24/2003 Document Revised: 02/02/2011 Document Reviewed: 03/23/2007 [facility] Patient Information 2012 [place], [place].Hysteroscopy Hysteroscopy is a procedure used for looking inside the womb (uterus). It may be done for many different reasons, including:  To evaluate abnormal bleeding, fibroid (benign, noncancerous) tumors, polyps, scar tissue (adhesions), and possibly cancer of the uterus.   To look for lumps (tumors) and other uterine  growths.   To look for causes of why a woman cannot get pregnant (infertility), causes of recurrent loss of pregnancy (miscarriages), or a lost intrauterine device (IUD).   To perform a sterilization by blocking the fallopian tubes from inside the uterus.  A hysteroscopy should be done right after a menstrual period to be sure you are not pregnant. LET YOUR CAREGIVER KNOW ABOUT:   Allergies.   Medicines taken, including herbs, eyedrops, over-the-counter medicines, and creams.   Use of steroids (by mouth or creams).   Previous problems with anesthetics or numbing medicines.   History of bleeding or blood problems.   History of blood clots.   Possibility of pregnancy, if this applies.   Previous surgery.   Other health problems.  RISKS AND COMPLICATIONS   Putting a hole in the uterus.   Excessive bleeding.   Infection.   Damage to the cervix.   Injury to  other organs.   Allergic reaction to medicines.   Too much fluid used in the uterus for the procedure.  BEFORE THE PROCEDURE   Do not take aspirin or blood thinners for a week before the procedure, or as directed. It can cause bleeding.   Arrive at least 60 minutes before the procedure or as directed to read and sign the necessary forms.   Arrange for someone to take you home after the procedure.   If you smoke, do not smoke for 2 weeks before the procedure.  PROCEDURE   Your caregiver may give you medicine to relax you. He or she may also give you a medicine that numbs the area around the cervix (local anesthetic) or a medicine that makes you sleep (general anesthesia).   Sometimes, a medicine is placed in the cervix the day before the procedure. This medicine makes the cervix have a larger opening (dilate). This makes it easier for the instrument to be inserted into the uterus.   A small instrument (hysteroscope) is inserted through the vagina into the uterus. This instrument is similar to a pencil-sized telescope with a light.   During the procedure, air or a liquid is put into the uterus, which allows the surgeon to see better.   Sometimes, tissue is gently scraped from inside the uterus. These tissue samples are sent to a specialist who looks at tissue samples (pathologist). The pathologist will give a report to your caregiver. This will help your caregiver decide if further treatment is necessary. The report will also help your caregiver decide on the best treatment if the test comes back abnormal.  AFTER THE PROCEDURE   If you had a general anesthetic, you may be groggy for a couple hours after the procedure.   If you had a local anesthetic, you will be advised to rest at the surgical center or caregiver's office until you are stable and feel ready to go home.   You may have some cramping for a couple days.   You may have bleeding, which varies from light spotting for a few  days to menstrual-like bleeding for up to 3 to 7 days. This is normal.   Have someone take you home.  FINDING OUT THE RESULTS OF YOUR TEST Not all test results are available during your visit. If your test results are not back during the visit, make an appointment with your caregiver to find out the results. Do not assume everything is normal if you have not heard from your caregiver or the medical facility. It is important for you  to follow up on all of your test results. HOME CARE INSTRUCTIONS   Do not drive for 24 hours or as instructed.   Only take over-the-counter or prescription medicines for pain, discomfort, or fever as directed by your caregiver.   Do not take aspirin. It can cause or aggravate bleeding.   Do not drive or drink alcohol while taking pain medicine.   You may resume your usual diet.   Do not use tampons, douche, or have sexual intercourse for 2 weeks, or as advised by your caregiver.   Rest and sleep for the first 24 to 48 hours.   Take your temperature twice a day for 4 to 5 days. Write it down. Give these temperatures to your caregiver if they are abnormal (above 98.6 F or 37.0 C).   Take medicines your caregiver has ordered as directed.   Follow your caregiver's advice regarding diet, exercise, lifting, driving, and general activities.   Take showers instead of baths for 2 weeks, or as recommended by your caregiver.   If you develop constipation:   Take a mild laxative with the advice of your caregiver.   Eat bran foods.   Drink enough water and fluids to keep your urine clear or pale yellow.   Try to have someone with you or available to you for the first 24 to 48 hours, especially if you had a general anesthetic.   Make sure you and your family understand everything about your operation and recovery.   Follow your caregiver's advice regarding follow-up appointments and Pap smears.  SEEK MEDICAL CARE IF:   You feel dizzy or lightheaded.   You  feel sick to your stomach (nauseous).   You develop abnormal vaginal discharge.   You develop a rash.   You have an abnormal reaction or allergy to your medicine.   You need stronger pain medicine.  SEEK IMMEDIATE MEDICAL CARE IF:   Bleeding is heavier than a normal menstrual period or you have blood clots.   You have an oral temperature above 102 F (38.9 C), not controlled by medicine.   You have increasing cramps or pains not relieved with medicine.   You develop belly (abdominal) pain that does not seem to be related to the same area of earlier cramping and pain.   You pass out.   You develop pain in the tops of your shoulders (shoulder strap areas).   You develop shortness of breath.  MAKE SURE YOU:   Understand these instructions.   Will watch your condition.   Will get help right away if you are not doing well or get worse.  Document Released: 05/22/2000 Document Revised: 02/02/2011 Document Reviewed: 09/14/2008 [facility] Patient Information 2012 [place], [place].PATIENT INSTRUCTIONS POST-ANESTHESIA  IMMEDIATELY FOLLOWING SURGERY:  Do not drive or operate machinery for the first twenty four hours after surgery.  Do not make any important decisions for twenty four hours after surgery or while taking narcotic pain medications or sedatives.  If you develop intractable nausea and vomiting or a severe headache please notify your doctor immediately.  FOLLOW-UP:  Please make an appointment with your surgeon as instructed. You do not need to follow up with anesthesia unless specifically instructed to do so.  WOUND CARE INSTRUCTIONS (if applicable):  Keep a dry clean dressing on the anesthesia/puncture wound site if there is drainage.  Once the wound has quit draining you may leave it open to air.  Generally you should leave the bandage intact for twenty four hours  unless there is drainage.  If the epidural site drains for more than 36-48 hours please call the anesthesia  department.  QUESTIONS?:  Please feel free to call your physician or the hospital operator if you have any questions, and they will be happy to assist you.     [facility] Anesthesia Department [address] [place] [phone]

## 2011-05-31 ENCOUNTER — Encounter (HOSPITAL_COMMUNITY): Payer: Self-pay | Admitting: Pharmacy Technician

## 2011-05-31 ENCOUNTER — Encounter (HOSPITAL_COMMUNITY)
Admission: RE | Admit: 2011-05-31 | Discharge: 2011-05-31 | Disposition: A | Discharge status: Home / Self Care | Payer: BC Managed Care – PPO | Source: Ambulatory Visit | Attending: Obstetrics and Gynecology | Admitting: Obstetrics and Gynecology

## 2011-05-31 ENCOUNTER — Other Ambulatory Visit: Payer: Self-pay | Admitting: Obstetrics and Gynecology

## 2011-05-31 ENCOUNTER — Encounter (HOSPITAL_COMMUNITY): Payer: Self-pay

## 2011-05-31 HISTORY — DX: Anemia, unspecified: D64.9

## 2011-05-31 LAB — PROTIME-INR
INR: 1.05 (ref 0.00–1.49)
Prothrombin Time: 13.9 seconds (ref 11.6–15.2)

## 2011-05-31 LAB — CBC
HCT: 33.9 % — ABNORMAL LOW (ref 36.0–46.0)
MCHC: 31.6 g/dL (ref 30.0–36.0)
MCV: 81.1 fL (ref 78.0–100.0)
Platelets: 259 10*3/uL (ref 150–400)
RDW: 16 % — ABNORMAL HIGH (ref 11.5–15.5)
WBC: 8.9 10*3/uL (ref 4.0–10.5)

## 2011-05-31 LAB — BASIC METABOLIC PANEL
BUN: 9 mg/dL (ref 6–23)
Calcium: 9.4 mg/dL (ref 8.4–10.5)
Creatinine, Ser: 0.86 mg/dL (ref 0.50–1.10)
GFR calc Af Amer: >90 mL/min (ref >90)

## 2011-05-31 LAB — SURGICAL PCR SCREEN: MRSA, PCR: POSITIVE — AB

## 2011-05-31 LAB — URINALYSIS, ROUTINE W REFLEX MICROSCOPIC
Bilirubin Urine: NEGATIVE
Glucose, UA: NEGATIVE mg/dL
Hgb urine dipstick: NEGATIVE
Specific Gravity, Urine: 1.01 (ref 1.005–1.030)
Urobilinogen, UA: 0.2 mg/dL (ref 0.0–1.0)

## 2011-05-31 LAB — APTT: aPTT: 32 seconds (ref 24–37)

## 2011-05-31 NOTE — H&P (Signed)
Haley Goodman is an 31 y.o. female. She is admitted for hysteroscopy D&C and endometrial ablation to address her heavy menses. 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 She is a gravida 4 para 2 AB 2 status post tubal ligation menses last 8 days with 7 days considered having hemoglobin was within acceptable limits she is on iron and vitamins. Pelvic ultrasound has been performed revealing a 7.7 x 5.2 x 4.7 cm without masses the endometrial stripe is within normal limits at 12.6 mm and there is no obvious mass is noted ovaries are grossly normal with 3.8 cm it normal right ovary and 2.6 cm long left ovary  Pertinent Gynecological History: Menses: regular every 28-30 days without intermenstrual spotting Bleeding: Heavy x7 days of a day cycle Contraception: tubal ligation DES exposure: unknown Blood transfusions: none Sexually transmitted diseases: recent diagnosis: Trichomonas in February 2013 patient and partner treated Previous GYN Procedures: Other than tubal ligation, none  Last mammogram: Not applicable Date:  Last pap: normal Date: February 2013 OB History: G 4 para 2022   Menstrual History: Menarche age:  Patient's last menstrual period was 05/04/2011.    Past Medical History  Diagnosis Date  . Hypertension   . Arthritis   . Lupus   . Fibromyalgia   . Anemia   . PE (pulmonary embolism) 2009    Past Surgical History  Procedure Date  . Cholecystectomy   . Tubal ligation   . Dilation and curettage of uterus     Family History  Problem Relation Age of Onset  . Stroke Mother   . Pseudochol deficiency Neg Hx   . Malignant hyperthermia Neg Hx   . Hypotension Neg Hx   . Anesthesia problems Neg Hx     Social History:  reports that she has quit smoking. She does not have any smokeless tobacco history on file. She reports that she does not drink alcohol or use illicit  drugs.  Allergies:  Allergies  Allergen Reactions  . Codeine Anaphylaxis and Swelling  . Compazine     Not in right state of mind.   [name] (Iodinated Diagnostic Agents) Nausea Only  . Macrobid Hives and Swelling  . Penicillins Hives  . Sulfasalazine Hives     (Not in a hospital admission)  ROS  Last menstrual period 05/04/2011. Physical Exam  Constitutional: She appears well-developed and well-nourished.  HENT:  Head: Normocephalic.  Eyes: Pupils are equal, round, and reactive to light.  Cardiovascular: Normal rate and regular rhythm.   GI: Soft.   weight 219 blood pressure 102/78 Pelvic exam: Normal external genitalia the vaginal length cervix normal to appearance nonpurulent uterus deep in the pelvis twice normal size adnexa without masses or tenderness  Results for orders placed during the hospital encounter of 05/31/11 (from the past 24 hour(s))  URINALYSIS, ROUTINE W REFLEX MICROSCOPIC     Status: Normal   Collection Time   05/31/11  1:02 PM      Component Value Range   Color, Urine YELLOW  YELLOW    APPearance CLEAR  CLEAR    Specific Gravity, Urine 1.010  1.005 - 1.030    pH 6.0  5.0 - 8.0    Glucose, UA NEGATIVE  NEGATIVE (mg/dL)   Hgb urine dipstick NEGATIVE  NEGATIVE    Bilirubin Urine NEGATIVE  NEGATIVE    Ketones, ur NEGATIVE  NEGATIVE (mg/dL)   Protein, ur NEGATIVE  NEGATIVE (mg/dL)  Urobilinogen, UA 0.2  0.0 - 1.0 (mg/dL)   Nitrite NEGATIVE  NEGATIVE    Leukocytes, UA NEGATIVE  NEGATIVE   APTT     Status: Normal   Collection Time   05/31/11  1:15 PM      Component Value Range   aPTT 32  24 - 37 (seconds)  BASIC METABOLIC PANEL     Status: Abnormal   Collection Time   05/31/11  1:15 PM      Component Value Range   Sodium 136  135 - 145 (mEq/L)   Potassium 4.5  3.5 - 5.1 (mEq/L)   Chloride 99  96 - 112 (mEq/L)   CO2 27  19 - 32 (mEq/L)   Glucose, Bld 92  70 - 99 (mg/dL)   BUN 9  6 - 23 (mg/dL)   Creatinine, Ser 1.61  0.50 - 1.10 (mg/dL)    Calcium 9.4  8.4 - 10.5 (mg/dL)   GFR calc non Af Amer 88 (*) >90 (mL/min)   GFR calc Af Amer >90  >90 (mL/min)  CBC     Status: Abnormal   Collection Time   05/31/11  1:15 PM      Component Value Range   WBC 8.9  4.0 - 10.5 (K/uL)   RBC 4.18  3.87 - 5.11 (MIL/uL)   Hemoglobin 10.7 (*) 12.0 - 15.0 (g/dL)   HCT 09.6 (*) 04.5 - 46.0 (%)   MCV 81.1  78.0 - 100.0 (fL)   MCH 25.6 (*) 26.0 - 34.0 (pg)   MCHC 31.6  30.0 - 36.0 (g/dL)   RDW 40.9 (*) 81.1 - 15.5 (%)   Platelets 259  150 - 400 (K/uL)  PROTIME-INR     Status: Normal   Collection Time   05/31/11  1:15 PM      Component Value Range   Prothrombin Time 13.9  11.6 - 15.2 (seconds)   INR 1.05  0.00 - 1.49     No results found.  Assessment/Plan: Menorrhagia, associated with chronic  anticoagulant therapy. Lupus erythematosus Fibromyalgia Rheumatoid arthritis Plan: Hysteroscopy D&C endometrial ablation on Tuesday, 06/05/2028 Haley Goodman 05/31/2011, 3:48 PM

## 2011-06-01 NOTE — Pre-Procedure Instructions (Signed)
Labs hgb-107 and hct  33.9 shown to Dr Jayme Cloud. No further orders given.

## 2011-06-06 ENCOUNTER — Ambulatory Visit (HOSPITAL_COMMUNITY): Payer: BC Managed Care – PPO | Admitting: Anesthesiology

## 2011-06-06 ENCOUNTER — Encounter (HOSPITAL_COMMUNITY): Payer: Self-pay | Admitting: *Deleted

## 2011-06-06 ENCOUNTER — Ambulatory Visit (HOSPITAL_COMMUNITY)
Admission: RE | Admit: 2011-06-06 | Discharge: 2011-06-06 | Disposition: A | Discharge status: Home / Self Care | Payer: BC Managed Care – PPO | Source: Ambulatory Visit | Attending: Obstetrics and Gynecology | Admitting: Obstetrics and Gynecology

## 2011-06-06 ENCOUNTER — Encounter (HOSPITAL_COMMUNITY)
Admission: RE | Disposition: A | Discharge status: Home / Self Care | Payer: Self-pay | Source: Ambulatory Visit | Attending: Obstetrics and Gynecology

## 2011-06-06 ENCOUNTER — Encounter (HOSPITAL_COMMUNITY): Payer: Self-pay | Admitting: Anesthesiology

## 2011-06-06 DIAGNOSIS — I2699 Other pulmonary embolism without acute cor pulmonale: Secondary | ICD-10-CM | POA: Diagnosis present

## 2011-06-06 DIAGNOSIS — IMO0001 Reserved for inherently not codable concepts without codable children: Secondary | ICD-10-CM | POA: Insufficient documentation

## 2011-06-06 DIAGNOSIS — I10 Essential (primary) hypertension: Secondary | ICD-10-CM | POA: Insufficient documentation

## 2011-06-06 DIAGNOSIS — IMO0002 Reserved for concepts with insufficient information to code with codable children: Secondary | ICD-10-CM | POA: Insufficient documentation

## 2011-06-06 DIAGNOSIS — K219 Gastro-esophageal reflux disease without esophagitis: Secondary | ICD-10-CM | POA: Insufficient documentation

## 2011-06-06 DIAGNOSIS — L930 Discoid lupus erythematosus: Secondary | ICD-10-CM | POA: Insufficient documentation

## 2011-06-06 DIAGNOSIS — M069 Rheumatoid arthritis, unspecified: Secondary | ICD-10-CM | POA: Insufficient documentation

## 2011-06-06 DIAGNOSIS — N920 Excessive and frequent menstruation with regular cycle: Secondary | ICD-10-CM | POA: Insufficient documentation

## 2011-06-06 DIAGNOSIS — Z86711 Personal history of pulmonary embolism: Secondary | ICD-10-CM | POA: Insufficient documentation

## 2011-06-06 DIAGNOSIS — Z01812 Encounter for preprocedural laboratory examination: Secondary | ICD-10-CM | POA: Insufficient documentation

## 2011-06-06 DIAGNOSIS — Z7901 Long term (current) use of anticoagulants: Secondary | ICD-10-CM | POA: Insufficient documentation

## 2011-06-06 LAB — PROTIME-INR
INR: 0.98 (ref 0.00–1.49)
Prothrombin Time: 13.2 seconds (ref 11.6–15.2)

## 2011-06-06 SURGERY — DILATATION & CURETTAGE/HYSTEROSCOPY WITH THERMACHOICE ABLATION
Anesthesia: General | Wound class: Clean Contaminated

## 2011-06-06 MED ORDER — 0.9 % SODIUM CHLORIDE (POUR BTL) OPTIME
TOPICAL | Status: DC | PRN
  Administered 2011-06-06: 1000 mL

## 2011-06-06 MED ORDER — FENTANYL CITRATE 0.05 MG/ML IJ SOLN
INTRAMUSCULAR | Status: DC | PRN
  Administered 2011-06-06: 25 ug via INTRAVENOUS
  Administered 2011-06-06: 50 ug via INTRAVENOUS
  Administered 2011-06-06: 25 ug via INTRAVENOUS
  Administered 2011-06-06 (×2): 50 ug via INTRAVENOUS

## 2011-06-06 MED ORDER — MIDAZOLAM HCL 2 MG/2ML IJ SOLN
INTRAMUSCULAR | Status: AC
  Administered 2011-06-06: 1 mg via INTRAVENOUS
  Filled 2011-06-06: qty 2

## 2011-06-06 MED ORDER — BUPIVACAINE-EPINEPHRINE PF 0.5-1:200000 % IJ SOLN
INTRAMUSCULAR | Status: AC
  Filled 2011-06-06: qty 10

## 2011-06-06 MED ORDER — SODIUM CHLORIDE 0.9 % IR SOLN
Status: DC | PRN
  Administered 2011-06-06: 3000 mL

## 2011-06-06 MED ORDER — ROCURONIUM BROMIDE 50 MG/5ML IV SOLN
INTRAVENOUS | Status: AC
  Filled 2011-06-06: qty 1

## 2011-06-06 MED ORDER — NEOSTIGMINE METHYLSULFATE 1 MG/ML IJ SOLN
INTRAMUSCULAR | Status: AC
  Filled 2011-06-06: qty 10

## 2011-06-06 MED ORDER — LIDOCAINE HCL (CARDIAC) 10 MG/ML IV SOLN
INTRAVENOUS | Status: DC | PRN
  Administered 2011-06-06: 10 mg via INTRAVENOUS

## 2011-06-06 MED ORDER — FENTANYL CITRATE 0.05 MG/ML IJ SOLN
INTRAMUSCULAR | Status: AC
  Administered 2011-06-06: 50 ug via INTRAVENOUS
  Filled 2011-06-06: qty 2

## 2011-06-06 MED ORDER — NEOSTIGMINE METHYLSULFATE 1 MG/ML IJ SOLN
INTRAMUSCULAR | Status: DC | PRN
  Administered 2011-06-06: 1 mg via INTRAVENOUS
  Administered 2011-06-06: 2 mg via INTRAVENOUS

## 2011-06-06 MED ORDER — BUPIVACAINE-EPINEPHRINE 0.5% -1:200000 IJ SOLN
INTRAMUSCULAR | Status: DC | PRN
  Administered 2011-06-06: 20 mL

## 2011-06-06 MED ORDER — GLYCOPYRROLATE 0.2 MG/ML IJ SOLN
INTRAMUSCULAR | Status: AC
  Filled 2011-06-06: qty 1

## 2011-06-06 MED ORDER — ONDANSETRON HCL 4 MG/2ML IJ SOLN
4.0000 mg | Freq: Once | INTRAMUSCULAR | Status: AC
  Administered 2011-06-06: 4 mg via INTRAVENOUS

## 2011-06-06 MED ORDER — ONDANSETRON HCL 4 MG/2ML IJ SOLN
INTRAMUSCULAR | Status: AC
  Administered 2011-06-06: 4 mg via INTRAVENOUS
  Filled 2011-06-06: qty 2

## 2011-06-06 MED ORDER — DEXTROSE 5 % IV SOLN
INTRAVENOUS | Status: DC | PRN
  Administered 2011-06-06: 30 mL via INTRAVENOUS

## 2011-06-06 MED ORDER — PROPOFOL 10 MG/ML IV EMUL
INTRAVENOUS | Status: AC
  Filled 2011-06-06: qty 20

## 2011-06-06 MED ORDER — FENTANYL CITRATE 0.05 MG/ML IJ SOLN
INTRAMUSCULAR | Status: AC
  Filled 2011-06-06: qty 2

## 2011-06-06 MED ORDER — FENTANYL CITRATE 0.05 MG/ML IJ SOLN
25.0000 ug | INTRAMUSCULAR | Status: DC | PRN
  Administered 2011-06-06 (×3): 50 ug via INTRAVENOUS

## 2011-06-06 MED ORDER — OXYCODONE-ACETAMINOPHEN 5-325 MG PO TABS: 1.0000 | ORAL_TABLET | ORAL | Status: AC | PRN

## 2011-06-06 MED ORDER — LIDOCAINE HCL (PF) 1 % IJ SOLN
INTRAMUSCULAR | Status: AC
  Filled 2011-06-06: qty 5

## 2011-06-06 MED ORDER — GLYCOPYRROLATE 0.2 MG/ML IJ SOLN
INTRAMUSCULAR | Status: DC | PRN
  Administered 2011-06-06: 0.4 mg via INTRAVENOUS
  Administered 2011-06-06: 0.2 mg via INTRAVENOUS

## 2011-06-06 MED ORDER — PROPOFOL 10 MG/ML IV EMUL
INTRAVENOUS | Status: DC | PRN
  Administered 2011-06-06: 30 mg via INTRAVENOUS
  Administered 2011-06-06: 20 mg via INTRAVENOUS
  Administered 2011-06-06: 150 mg via INTRAVENOUS

## 2011-06-06 MED ORDER — LACTATED RINGERS IV SOLN
INTRAVENOUS | Status: DC
  Administered 2011-06-06: 1000 mL via INTRAVENOUS

## 2011-06-06 MED ORDER — ROCURONIUM BROMIDE 100 MG/10ML IV SOLN
INTRAVENOUS | Status: DC | PRN
  Administered 2011-06-06: 30 mg via INTRAVENOUS
  Administered 2011-06-06: 5 mg via INTRAVENOUS

## 2011-06-06 MED ORDER — MIDAZOLAM HCL 2 MG/2ML IJ SOLN
1.0000 mg | INTRAMUSCULAR | Status: DC | PRN
  Administered 2011-06-06: 1 mg via INTRAVENOUS

## 2011-06-06 MED ORDER — ONDANSETRON HCL 4 MG/2ML IJ SOLN: 4.0000 mg | Freq: Once | INTRAMUSCULAR | Status: DC | PRN

## 2011-06-06 SURGICAL SUPPLY — 27 items
BAG DECANTER FOR FLEXI CONT (MISCELLANEOUS) ×2 IMPLANT
BAG HAMPER (MISCELLANEOUS) ×2 IMPLANT
CATH ROBINSON RED A/P 16FR (CATHETERS) IMPLANT
CATH THERMACHOICE III (CATHETERS) ×2 IMPLANT
CLOTH BEACON ORANGE TIMEOUT ST (SAFETY) ×2 IMPLANT
COVER SURGICAL LIGHT HANDLE (MISCELLANEOUS) ×4 IMPLANT
FORMALIN 10 PREFIL 480ML (MISCELLANEOUS) ×2 IMPLANT
GLOVE ECLIPSE 7.0 STRL STRAW (GLOVE) ×2 IMPLANT
GLOVE ECLIPSE 9.0 STRL (GLOVE) ×2 IMPLANT
GLOVE EXAM NITRILE MD LF STRL (GLOVE) ×2 IMPLANT
GLOVE INDICATOR 7.5 STRL GRN (GLOVE) ×2 IMPLANT
GLOVE INDICATOR STER SZ 9 (GLOVE) ×2 IMPLANT
GOWN STRL REIN 3XL LVL4 (GOWN DISPOSABLE) ×2 IMPLANT
GOWN STRL REIN XL XLG (GOWN DISPOSABLE) ×4 IMPLANT
INST SET HYSTEROSCOPY (KITS) ×2 IMPLANT
IV D5W 500ML (IV SOLUTION) ×2 IMPLANT
IV NS IRRIG 3000ML ARTHROMATIC (IV SOLUTION) ×2 IMPLANT
KIT ROOM TURNOVER AP CYSTO (KITS) ×2 IMPLANT
MANIFOLD NEPTUNE II (INSTRUMENTS) ×2 IMPLANT
MANIFOLD NEPTUNE WASTE (CANNULA) ×2 IMPLANT
NS IRRIG 1000ML POUR BTL (IV SOLUTION) ×2 IMPLANT
PACK PERI GYN (CUSTOM PROCEDURE TRAY) ×2 IMPLANT
PAD ARMBOARD 7.5X6 YLW CONV (MISCELLANEOUS) ×2 IMPLANT
PAD TELFA 3X4 1S STER (GAUZE/BANDAGES/DRESSINGS) ×2 IMPLANT
SET BASIN LINEN APH (SET/KITS/TRAYS/PACK) ×2 IMPLANT
SET IRRIG Y TYPE TUR BLADDER L (SET/KITS/TRAYS/PACK) ×2 IMPLANT
SYR CONTROL 10ML LL (SYRINGE) ×2 IMPLANT

## 2011-06-06 NOTE — Op Note (Signed)
See dictated operative note included in brief of note

## 2011-06-06 NOTE — Transfer of Care (Signed)
Immediate Anesthesia Transfer of Care Note  Patient: Haley Goodman  Procedure(s) Performed: Procedure(s) (LRB): DILATATION & CURETTAGE/HYSTEROSCOPY WITH THERMACHOICE ABLATION (N/A)  Patient Location: PACU  Anesthesia Type: General  Level of Consciousness: awake  Airway & Oxygen Therapy: Patient Spontanous Breathing and non-rebreather face mask  Post-op Assessment: Report given to PACU RN, Post -op Vital signs reviewed and stable and Patient moving all extremities  Post vital signs: Reviewed and stable  Complications: No apparent anesthesia complications

## 2011-06-06 NOTE — Anesthesia Preprocedure Evaluation (Signed)
Anesthesia Evaluation  Patient identified by MRN, date of birth, ID band Patient awake    Reviewed: Allergy & Precautions, H&P , NPO status , Patient's Chart, lab work & pertinent test results  History of Anesthesia Complications Negative for: history of anesthetic complications  Airway Mallampati: II      Dental  (+) Teeth Intact   Pulmonary neg pulmonary ROS,  breath sounds clear to auscultation        Cardiovascular hypertension, Pt. on medications Rhythm:Regular  Pulm embolus 2009, off coumadin.   Neuro/Psych    GI/Hepatic GERD-  Medicated and Controlled,  Endo/Other  Morbid obesityLupus, no steroids   Renal/GU      Musculoskeletal  (+) Fibromyalgia -  Abdominal (+) + obese,   Peds  Hematology   Anesthesia Other Findings   Reproductive/Obstetrics                           Anesthesia Physical Anesthesia Plan  ASA: III  Anesthesia Plan: General   Post-op Pain Management:    Induction: Intravenous, Rapid sequence and Cricoid pressure planned  Airway Management Planned: Oral ETT  Additional Equipment:   Intra-op Plan:   Post-operative Plan: Extubation in OR  Informed Consent: I have reviewed the patients History and Physical, chart, labs and discussed the procedure including the risks, benefits and alternatives for the proposed anesthesia with the patient or authorized representative who has indicated his/her understanding and acceptance.     Plan Discussed with:   Anesthesia Plan Comments:         Anesthesia Quick Evaluation

## 2011-06-06 NOTE — Anesthesia Postprocedure Evaluation (Signed)
Anesthesia Post Note  Patient: Haley Goodman  Procedure(s) Performed: Procedure(s) (LRB): DILATATION & CURETTAGE/HYSTEROSCOPY WITH THERMACHOICE ABLATION (N/A)  Anesthesia type: General  Patient location: PACU  Post pain: Pain level controlled  Post assessment: Post-op Vital signs reviewed, Patient's Cardiovascular Status Stable, Respiratory Function Stable, Patent Airway, No signs of Nausea or vomiting and Pain level controlled  Last Vitals:  Filed Vitals:   06/06/11 1146  BP: 131/73  Pulse: 108  Temp: 36.8 C  Resp: 16    Post vital signs: Reviewed and stable  Level of consciousness: awake and alert   Complications: No apparent anesthesia complications

## 2011-06-06 NOTE — Interval H&P Note (Signed)
History and Physical Interval Note:  06/06/2011 10:18 AM  Haley Goodman  has presented today for surgery, with the diagnosis of menometrorrhagia dyspareunia  The various methods of treatment have been discussed with the patient and family. After consideration of risks, benefits and other options for treatment, the patient has consented to  Procedure(s) (LRB): DILATATION & CURETTAGE/HYSTEROSCOPY WITH THERMACHOICE ABLATION (N/A) as a surgical intervention .  The patients' history has been reviewed, patient examined, no change in status, stable for surgery.  I have reviewed the patients' chart and labs.  Questions were answered to the patient's satisfaction.     Tilda Burrow  Haley Goodman, has been off her coumadin x 5 days, has INR of 0.96 this morning, so she should be properly reversed for the procedure.  She will restart coumadin in the morning.

## 2011-06-06 NOTE — Brief Op Note (Signed)
06/06/2011  11:25 AM  PATIENT:  Haley Goodman  31 y.o. female  PRE-OPERATIVE DIAGNOSIS:  Menometrorrhagia, anticoagulation due to history of Pulmonary Embolism dyspareunia  POST-OPERATIVE DIAGNOSIS:  menometrorrhagia dyspareunia  PROCEDURE:  Procedure(s) (LRB): DILATATION & CURETTAGE/HYSTEROSCOPY WITH THERMACHOICE ABLATION (N/A)  SURGEON:  Surgeon(s) and Role:    * Tilda Burrow, MD - Primary  PHYSICIAN ASSISTANT:   ASSISTANTS: none   ANESTHESIA:   general and paracervical block  EBL:  Total I/O In: 750 [I.V.:750] Out: 0   BLOOD ADMINISTERED:none  DRAINS: none   LOCAL MEDICATIONS USED:  MARCAINE    and Amount: 20 ml  SPECIMEN:  No Specimen  DISPOSITION OF SPECIMEN:  N/A  COUNTS:  YES  TOURNIQUET:  * No tourniquets in log *  DICTATION: .Dragon Dictation The patient was taken to the operating room prepped and draped for vaginal surgery. Timeout was conducted and patient surgical procedure and plan confirmed by surgical team. After draping was completed, speculum was inserted, the cervix grasped with single-tooth tenaculum, and sounded in the anteflexed position to 9 cm. Dilation to 62 [place] followed, and with introduction of the rigid 30 hysteroscope which showed a thin endometrium no pathology identifiable and thought tiny normal tubal ostia bilaterally. Gynecare ThermaChoice 3 endometrial ablation device was tested and activated inserted and filled with 12 cc of D5W, resulting in 150 mmHg pressure achieved, then the 8 minute thermal ablation sequence initiated and completed without difficulty. All fluid was recovered. A paracervical block using Marcaine 10 cc on the side and 3 for and 5:00 on one side, 7,8,9 on the opposite side was then completed. Upon completion of the endometrial ablation the fluid was recovered, and instruments removed. There was minimal bleeding. The patient was then allowed to recover room in stable condition sponge and needle counts  correct.  PLAN OF CARE: Discharge to home after PACU  PATIENT DISPOSITION:  PACU - hemodynamically stable.   Delay start of Pharmacological VTE agent (>24hrs) due to surgical blood loss or risk of bleeding: not applicable

## 2011-06-06 NOTE — Discharge Instructions (Signed)
Continue the Lovenox (enoxaparin) for 5 days , then may discontinue.  Resume your coumadin today, and make an appointment for approximately 2 weeks for you primary care physician, so that INR can be rechecked

## 2011-06-06 NOTE — H&P (View-Only) (Signed)
Haley Goodman is an 31 y.o. female. She is admitted for hysteroscopy D&C and endometrial ablation to address her heavy menses. 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 She is a gravida 4 para 2 AB 2 status post tubal ligation menses last 8 days with 7 days considered having hemoglobin was within acceptable limits she is on iron and vitamins. Pelvic ultrasound has been performed revealing a 7.7 x 5.2 x 4.7 cm without masses the endometrial stripe is within normal limits at 12.6 mm and there is no obvious mass is noted ovaries are grossly normal with 3.8 cm it normal right ovary and 2.6 cm long left ovary  Pertinent Gynecological History: Menses: regular every 28-30 days without intermenstrual spotting Bleeding: Heavy x7 days of a day cycle Contraception: tubal ligation DES exposure: unknown Blood transfusions: none Sexually transmitted diseases: recent diagnosis: Trichomonas in February 2013 patient and partner treated Previous GYN Procedures: Other than tubal ligation, none  Last mammogram: Not applicable Date:  Last pap: normal Date: February 2013 OB History: G 4 para 2022   Menstrual History: Menarche age:  Patient's last menstrual period was 05/04/2011.    Past Medical History  Diagnosis Date  . Hypertension   . Arthritis   . Lupus   . Fibromyalgia   . Anemia   . PE (pulmonary embolism) 2009    Past Surgical History  Procedure Date  . Cholecystectomy   . Tubal ligation   . Dilation and curettage of uterus     Family History  Problem Relation Age of Onset  . Stroke Mother   . Pseudochol deficiency Neg Hx   . Malignant hyperthermia Neg Hx   . Hypotension Neg Hx   . Anesthesia problems Neg Hx     Social History:  reports that she has quit smoking. She does not have any smokeless tobacco history on file. She reports that she does not drink alcohol or use illicit  drugs.  Allergies:  Allergies  Allergen Reactions  . Codeine Anaphylaxis and Swelling  . Compazine     Not in right state of mind.   . Ivp Dye (Iodinated Diagnostic Agents) Nausea Only  . Macrobid Hives and Swelling  . Penicillins Hives  . Sulfasalazine Hives     (Not in a hospital admission)  ROS  Last menstrual period 05/04/2011. Physical Exam  Constitutional: She appears well-developed and well-nourished.  HENT:  Head: Normocephalic.  Eyes: Pupils are equal, round, and reactive to light.  Cardiovascular: Normal rate and regular rhythm.   GI: Soft.   weight 219 blood pressure 102/78 Pelvic exam: Normal external genitalia the vaginal length cervix normal to appearance nonpurulent uterus deep in the pelvis twice normal size adnexa without masses or tenderness  Results for orders placed during the hospital encounter of 05/31/11 (from the past 24 hour(s))  URINALYSIS, ROUTINE W REFLEX MICROSCOPIC     Status: Normal   Collection Time   05/31/11  1:02 PM      Component Value Range   Color, Urine YELLOW  YELLOW    APPearance CLEAR  CLEAR    Specific Gravity, Urine 1.010  1.005 - 1.030    pH 6.0  5.0 - 8.0    Glucose, UA NEGATIVE  NEGATIVE (mg/dL)   Hgb urine dipstick NEGATIVE  NEGATIVE    Bilirubin Urine NEGATIVE  NEGATIVE    Ketones, ur NEGATIVE  NEGATIVE (mg/dL)   Protein, ur NEGATIVE  NEGATIVE (mg/dL)     Urobilinogen, UA 0.2  0.0 - 1.0 (mg/dL)   Nitrite NEGATIVE  NEGATIVE    Leukocytes, UA NEGATIVE  NEGATIVE   APTT     Status: Normal   Collection Time   05/31/11  1:15 PM      Component Value Range   aPTT 32  24 - 37 (seconds)  BASIC METABOLIC PANEL     Status: Abnormal   Collection Time   05/31/11  1:15 PM      Component Value Range   Sodium 136  135 - 145 (mEq/L)   Potassium 4.5  3.5 - 5.1 (mEq/L)   Chloride 99  96 - 112 (mEq/L)   CO2 27  19 - 32 (mEq/L)   Glucose, Bld 92  70 - 99 (mg/dL)   BUN 9  6 - 23 (mg/dL)   Creatinine, Ser 0.86  0.50 - 1.10 (mg/dL)    Calcium 9.4  8.4 - 10.5 (mg/dL)   GFR calc non Af Amer 88 (*) >90 (mL/min)   GFR calc Af Amer >90  >90 (mL/min)  CBC     Status: Abnormal   Collection Time   05/31/11  1:15 PM      Component Value Range   WBC 8.9  4.0 - 10.5 (K/uL)   RBC 4.18  3.87 - 5.11 (MIL/uL)   Hemoglobin 10.7 (*) 12.0 - 15.0 (g/dL)   HCT 33.9 (*) 36.0 - 46.0 (%)   MCV 81.1  78.0 - 100.0 (fL)   MCH 25.6 (*) 26.0 - 34.0 (pg)   MCHC 31.6  30.0 - 36.0 (g/dL)   RDW 16.0 (*) 11.5 - 15.5 (%)   Platelets 259  150 - 400 (K/uL)  PROTIME-INR     Status: Normal   Collection Time   05/31/11  1:15 PM      Component Value Range   Prothrombin Time 13.9  11.6 - 15.2 (seconds)   INR 1.05  0.00 - 1.49     No results found.  Assessment/Plan: Menorrhagia, associated with chronic  anticoagulant therapy. Lupus erythematosus Fibromyalgia Rheumatoid arthritis Plan: Hysteroscopy D&C endometrial ablation on Tuesday, 06/05/2028 Sahar Ryback V 05/31/2011, 3:48 PM  

## 2011-06-06 NOTE — Anesthesia Procedure Notes (Signed)
Procedure Name: Intubation Date/Time: 06/06/2011 10:41 AM Performed by: Franco Nones Pre-anesthesia Checklist: Patient identified, Patient being monitored, Timeout performed, Emergency Drugs available and Suction available Patient Re-evaluated:Patient Re-evaluated prior to inductionOxygen Delivery Method: Circle System Utilized Preoxygenation: Pre-oxygenation with 100% oxygen Intubation Type: IV induction, Cricoid Pressure applied and Rapid sequence Ventilation: Mask ventilation without difficulty Laryngoscope Size: Miller and 2 Grade View: Grade I Tube type: Oral Tube size: 7.0 mm Number of attempts: 1 Airway Equipment and Method: stylet Placement Confirmation: ETT inserted through vocal cords under direct vision,  positive ETCO2 and breath sounds checked- equal and bilateral Secured at: 21 cm Tube secured with: Tape Dental Injury: Teeth and Oropharynx as per pre-operative assessment

## 2012-04-04 ENCOUNTER — Other Ambulatory Visit (HOSPITAL_COMMUNITY): Payer: Self-pay | Admitting: Physical Medicine and Rehabilitation

## 2012-04-04 DIAGNOSIS — IMO0002 Reserved for concepts with insufficient information to code with codable children: Secondary | ICD-10-CM

## 2012-04-04 DIAGNOSIS — M545 Low back pain, unspecified: Secondary | ICD-10-CM

## 2012-04-09 ENCOUNTER — Ambulatory Visit (HOSPITAL_COMMUNITY)
Admission: RE | Admit: 2012-04-09 | Discharge: 2012-04-09 | Disposition: A | Discharge status: Home / Self Care | Payer: BC Managed Care – PPO | Source: Ambulatory Visit | Attending: Physical Medicine and Rehabilitation | Admitting: Physical Medicine and Rehabilitation

## 2012-04-09 DIAGNOSIS — M545 Low back pain, unspecified: Secondary | ICD-10-CM

## 2012-04-09 DIAGNOSIS — IMO0002 Reserved for concepts with insufficient information to code with codable children: Secondary | ICD-10-CM

## 2012-04-09 DIAGNOSIS — M546 Pain in thoracic spine: Secondary | ICD-10-CM | POA: Insufficient documentation

## 2012-04-09 DIAGNOSIS — M5126 Other intervertebral disc displacement, lumbar region: Secondary | ICD-10-CM | POA: Insufficient documentation

## 2013-06-10 ENCOUNTER — Emergency Department (HOSPITAL_COMMUNITY): Payer: BC Managed Care – PPO

## 2013-06-10 ENCOUNTER — Emergency Department (HOSPITAL_COMMUNITY)
Admission: EM | Admit: 2013-06-10 | Discharge: 2013-06-10 | Disposition: A | Discharge status: Home / Self Care | Payer: BC Managed Care – PPO | Attending: Emergency Medicine | Admitting: Emergency Medicine

## 2013-06-10 ENCOUNTER — Encounter (HOSPITAL_COMMUNITY): Payer: Self-pay | Admitting: Emergency Medicine

## 2013-06-10 DIAGNOSIS — R0789 Other chest pain: Secondary | ICD-10-CM | POA: Insufficient documentation

## 2013-06-10 DIAGNOSIS — Z79899 Other long term (current) drug therapy: Secondary | ICD-10-CM | POA: Insufficient documentation

## 2013-06-10 DIAGNOSIS — Z7901 Long term (current) use of anticoagulants: Secondary | ICD-10-CM | POA: Insufficient documentation

## 2013-06-10 DIAGNOSIS — Z86711 Personal history of pulmonary embolism: Secondary | ICD-10-CM | POA: Insufficient documentation

## 2013-06-10 DIAGNOSIS — Z862 Personal history of diseases of the blood and blood-forming organs and certain disorders involving the immune mechanism: Secondary | ICD-10-CM | POA: Insufficient documentation

## 2013-06-10 DIAGNOSIS — IMO0001 Reserved for inherently not codable concepts without codable children: Secondary | ICD-10-CM | POA: Insufficient documentation

## 2013-06-10 DIAGNOSIS — R209 Unspecified disturbances of skin sensation: Secondary | ICD-10-CM | POA: Insufficient documentation

## 2013-06-10 DIAGNOSIS — Z8739 Personal history of other diseases of the musculoskeletal system and connective tissue: Secondary | ICD-10-CM | POA: Insufficient documentation

## 2013-06-10 DIAGNOSIS — R000 Tachycardia, unspecified: Secondary | ICD-10-CM | POA: Insufficient documentation

## 2013-06-10 DIAGNOSIS — Z792 Long term (current) use of antibiotics: Secondary | ICD-10-CM | POA: Insufficient documentation

## 2013-06-10 DIAGNOSIS — N912 Amenorrhea, unspecified: Secondary | ICD-10-CM | POA: Insufficient documentation

## 2013-06-10 DIAGNOSIS — Z880 Allergy status to penicillin: Secondary | ICD-10-CM | POA: Insufficient documentation

## 2013-06-10 DIAGNOSIS — I10 Essential (primary) hypertension: Secondary | ICD-10-CM | POA: Insufficient documentation

## 2013-06-10 LAB — D-DIMER, QUANTITATIVE: D-Dimer, Quant: 0.27 ug/mL-FEU (ref 0.00–0.48)

## 2013-06-10 LAB — PROTIME-INR
INR: 3.27 — AB (ref 0.00–1.49)
Prothrombin Time: 32.1 seconds — ABNORMAL HIGH (ref 11.6–15.2)

## 2013-06-10 LAB — TROPONIN I: Troponin I: <0.3 ng/mL (ref <0.30)

## 2013-06-10 MED ORDER — PREDNISONE 20 MG PO TABS: ORAL_TABLET | ORAL | Status: DC

## 2013-06-10 MED ORDER — HYDROCODONE-ACETAMINOPHEN 5-325 MG PO TABS
1.0000 | ORAL_TABLET | Freq: Four times a day (QID) | ORAL | Status: DC | PRN
Start: 2013-06-10 — End: 2013-08-25

## 2013-06-10 MED ORDER — HYDROCODONE-ACETAMINOPHEN 5-325 MG PO TABS
1.0000 | ORAL_TABLET | Freq: Once | ORAL | Status: AC
  Administered 2013-06-10: 1 via ORAL
  Filled 2013-06-10: qty 1

## 2013-06-10 MED ORDER — PREDNISONE 50 MG PO TABS
60.0000 mg | ORAL_TABLET | Freq: Once | ORAL | Status: AC
  Administered 2013-06-10: 60 mg via ORAL
  Filled 2013-06-10 (×2): qty 1

## 2013-06-10 MED ORDER — HYDROCODONE-ACETAMINOPHEN 5-325 MG PO TABS
2.0000 | ORAL_TABLET | Freq: Once | ORAL | Status: AC
  Administered 2013-06-10: 2 via ORAL
  Filled 2013-06-10: qty 2

## 2013-06-10 NOTE — ED Notes (Signed)
Patient given Ginger Ale per RN approval.

## 2013-06-10 NOTE — Discharge Instructions (Signed)
Chest Pain (Nonspecific) Take Tylenol for mild pain or the pain medicine prescribed for bad pain. No Coumadin tonight. Your INR today was 3.27 which is mildly high. Ask Dr.Bluth to recheck your INR at your appointment on 06/18/2013. Don't take prednisone together with Ativan(lorezepam), as the combination can be dangerous Chest pain has many causes. Your pain could be caused by something serious, such as a heart attack or a blood clot in the lungs. It could also be caused by something less serious, such as a chest bruise or a virus. Follow up with your doctor. More lab tests or other studies may be needed to find the cause of your pain. Most of the time, nonspecific chest pain will improve within 2 to 3 days of rest and mild pain medicine. HOME CARE  For chest bruises, you may put ice on the sore area for 15-20 minutes, 03-04 times a day. Do this only if it makes you feel better.  Put ice in a plastic bag.  Place a towel between the skin and the bag.  Rest for the next 2 to 3 days.  Go back to work if the pain improves.  See your doctor if the pain lasts longer than 1 to 2 weeks.  Only take medicine as told by your doctor.  Quit smoking if you smoke. GET HELP RIGHT AWAY IF:   There is more pain or pain that spreads to the arm, neck, jaw, back, or belly (abdomen).  You have shortness of breath.  You cough more than usual or cough up blood.  You have very bad back or belly pain, feel sick to your stomach (nauseous), or throw up (vomit).  You have very bad weakness.  You pass out (faint).  You have a fever. Any of these problems may be serious and may be an emergency. Do not wait to see if the problems will go away. Get medical help right away. Call your local emergency services 911 in [place] Do not drive yourself to the hospital. MAKE SURE YOU:   Understand these instructions.  Will watch this condition.  Will get help right away if you or your child is not doing well or gets  worse. Document Released: 08/02/2007 Document Revised: 05/08/2011 Document Reviewed: 08/02/2007 [facility] Patient Information 2014 [place], [place].

## 2013-06-10 NOTE — ED Notes (Signed)
Patient c/o central chest tightness with pain in back and tingling in hands bilaterally. Denies any shortness of breath. Patient also reports generalized aching as well. Denies any fevers. Per patient hx PEs, lupus, and fibromyalgia.

## 2013-06-10 NOTE — ED Provider Notes (Signed)
CSN: [id number]     Arrival date & time 06/10/13  1602 History   First MD Initiated Contact with Patient 06/10/13 1722     Chief Complaint  Patient presents with  . Chest Pain     (Consider location/radiation/quality/duration/timing/severity/associated sxs/prior Treatment) HPI Complains of chest pain anterior pleuritic gradual onset yesterday approximately 3 PM. Symptoms accompanied by tingling in fingers of both hands. She denies shortness of breath denies cough denies fever symptoms feel like flareup of lupus or fibromyalgia she's had in the past.. Prior pulmonary embolism manifested with shortness of breath. No treatment prior to coming here pain is worse with deep inspiration on by anything. Moderate at present. Sharp in quality. Past Medical History  Diagnosis Date  . Hypertension   . Arthritis   . Lupus   . Fibromyalgia   . Anemia   . PE (pulmonary embolism) 2009   Past Surgical History  Procedure Laterality Date  . Cholecystectomy    . Tubal ligation    . Dilation and curettage of uterus    . Ablation     Family History  Problem Relation Age of Onset  . Stroke Mother   . Pseudochol deficiency Neg Hx   . Malignant hyperthermia Neg Hx   . Hypotension Neg Hx   . Anesthesia problems Neg Hx   . Cancer Other   . Diabetes Other    History  Substance Use Topics  . Smoking status: Never Smoker   . Smokeless tobacco: Never Used  . Alcohol Use: No   OB History   Grav Para Term Preterm Abortions TAB SAB Ect Mult Living   4 2  2 2     2      Review of Systems  Cardiovascular: Positive for chest pain.  Genitourinary:       Amenorrhea for 2 years  Neurological:       Tingling in hands  All other systems reviewed and are negative.     Allergies  Codeine; Compazine; Ivp dye; Nitrofurantoin monohyd macro; Penicillins; and Sulfasalazine  Home Medications   Prior to Admission medications   Medication Sig Start Date End Date Taking? Authorizing Provider   cyclobenzaprine (FLEXERIL) 10 MG tablet Take 10 mg by mouth at bedtime.    Historical Provider, MD  doxycycline (VIBRA-TABS) 100 MG tablet Take 100 mg by mouth daily. Pt Takes routinely    Historical Provider, MD  enoxaparin (LOVENOX) 100 MG/ML injection Inject 100 mg into the skin daily.    Historical Provider, MD  hydroxychloroquine (PLAQUENIL) 200 MG tablet Take 200 mg by mouth 2 (two) times daily.    Historical Provider, MD  lisinopril-hydrochlorothiazide (PRINZIDE,ZESTORETIC) 10-12.5 MG per tablet Take 1 tablet by mouth daily.    Historical Provider, MD  LORazepam (ATIVAN) 1 MG tablet Take 1 mg by mouth at bedtime.    Historical Provider, MD  methocarbamol (ROBAXIN) 500 MG tablet Take 500 mg by mouth 2 (two) times daily as needed. For muscle pain    Historical Provider, MD  nortriptyline (PAMELOR) 75 MG capsule Take 75 mg by mouth daily.    Historical Provider, MD  warfarin (COUMADIN) 2 MG tablet Take 2 mg by mouth See admin instructions. Take with 5mg = 7mg  on Sunday, Tuesday, Wednesday, Friday    Historical Provider, MD  warfarin (COUMADIN) 5 MG tablet Take 5 mg by mouth daily. Take 5mg  on Monday, Thursday, and Saturday.  Take 5mg  with 2mg = 7mg  on Sunday, Tuesday, Wednesday, Friday    Historical Provider, MD  ziprasidone (  GEODON) 80 MG capsule Take 80 mg by mouth at bedtime.    Historical Provider, MD  zolpidem (AMBIEN) 10 MG tablet Take 10 mg by mouth at bedtime as needed. For sleep    Historical Provider, MD   BP 133/77  Pulse 101  Temp(Src) 98.8 F (37.1 C) (Oral)  Resp 18  Ht 5\' 7"  (1.702 m)  Wt 238 lb (107.956 kg)  BMI 37.27 kg/m2  SpO2 100% Physical Exam  Nursing note and vitals reviewed. Constitutional: She appears well-developed and well-nourished.  HENT:  Head: Normocephalic and atraumatic.  Eyes: Conjunctivae are normal. Pupils are equal, round, and reactive to light.  Neck: Neck supple. No tracheal deviation present. No thyromegaly present.  Cardiovascular: Regular  rhythm and intact distal pulses.  Exam reveals no friction rub.   No murmur heard. Mildly tachycardic  Pulmonary/Chest: Effort normal and breath sounds normal.  Tender over sternum, reproducing pain exactly.  Abdominal: Soft. Bowel sounds are normal. She exhibits no distension. There is no tenderness.  OBese  Musculoskeletal: Normal range of motion. She exhibits no edema and no tenderness.  Neurological: She is alert. Coordination normal.  Skin: Skin is warm and dry. No rash noted.  Psychiatric: She has a normal mood and affect.    ED Course  Procedures (including critical care time) Labs Review Labs Reviewed - No data to display  Imaging Review No results found.   EKG Interpretation   Date/Time:  Tuesday June 10 2013 16:08:14 EDT Ventricular Rate:  100 PR Interval:  152 QRS Duration: 86 QT Interval:  308 QTC Calculation: 397 R Axis:   -22 Text Interpretation:  Normal sinus rhythm Nonspecific T wave abnormality  Abnormal ECG     no significant change since last tracing Results for orders placed during the hospital encounter of 06/10/13  D-DIMER, QUANTITATIVE      Result Value Ref Range   D-Dimer, Quant 0.27  0.00 - 0.48 ug/mL-FEU  TROPONIN I      Result Value Ref Range   Troponin I <0.30  <0.30 ng/mL  PROTIME-INR      Result Value Ref Range   Prothrombin Time 32.1 (*) 11.6 - 15.2 seconds   INR 3.27 (*) 0.00 - 1.49   Dg Chest 2 View  06/10/2013   CLINICAL DATA:  Chest pain and cough.  EXAM: CHEST  2 VIEW  COMPARISON:  04/24/2013 and 03/23/2013  FINDINGS: The heart size and mediastinal contours are within normal limits. Both lungs are clear. The visualized skeletal structures are unremarkable.  IMPRESSION: No active cardiopulmonary disease.   Electronically Signed   By: Rosalie Gums M.D.   On: 06/10/2013 19:45    Chest x-ray viewed by me. 7:55 PM patient reports slight improvement of pain after treatment with Norco and prednisone. She requests more pain medicine.  Additional Norco ordered. MDM  Low clinical probability for pulmonary embolism, given gradual onset symptoms, no shortness of breath the symptoms notsimilar to prior pulmonary. Doubt acute coronary syndrome highly atypical symptoms, and nonacute EKG, negative troponin after 24 hours of symptoms Final diagnoses:  None   Patient suffers from chronic pain, and reports that she has an introductory followup visit with a pain management clinic in May 2015. Prednisone will be prescribed as she states this helped her with similar symptoms to this in the past Plan prescription Norco, prednisone She is encouraged to keep appointment with primary care physician Dr.Bluth on 06/18/2013 No Coumadin tonight Diagnoses #1 atypical chest pain #2 mild Coumadin toxicity  Doug Sou, MD 06/10/13 2007

## 2013-08-24 ENCOUNTER — Encounter (HOSPITAL_COMMUNITY): Payer: Self-pay | Admitting: Emergency Medicine

## 2013-08-24 ENCOUNTER — Emergency Department (HOSPITAL_COMMUNITY): Payer: BC Managed Care – PPO

## 2013-08-24 DIAGNOSIS — Z880 Allergy status to penicillin: Secondary | ICD-10-CM | POA: Insufficient documentation

## 2013-08-24 DIAGNOSIS — R51 Headache: Secondary | ICD-10-CM | POA: Insufficient documentation

## 2013-08-24 DIAGNOSIS — R079 Chest pain, unspecified: Secondary | ICD-10-CM | POA: Insufficient documentation

## 2013-08-24 DIAGNOSIS — Z79899 Other long term (current) drug therapy: Secondary | ICD-10-CM | POA: Insufficient documentation

## 2013-08-24 DIAGNOSIS — Z7901 Long term (current) use of anticoagulants: Secondary | ICD-10-CM | POA: Insufficient documentation

## 2013-08-24 DIAGNOSIS — I10 Essential (primary) hypertension: Secondary | ICD-10-CM | POA: Insufficient documentation

## 2013-08-24 DIAGNOSIS — Z86711 Personal history of pulmonary embolism: Secondary | ICD-10-CM | POA: Insufficient documentation

## 2013-08-24 DIAGNOSIS — Z862 Personal history of diseases of the blood and blood-forming organs and certain disorders involving the immune mechanism: Secondary | ICD-10-CM | POA: Insufficient documentation

## 2013-08-24 DIAGNOSIS — Z8739 Personal history of other diseases of the musculoskeletal system and connective tissue: Secondary | ICD-10-CM | POA: Insufficient documentation

## 2013-08-24 NOTE — ED Notes (Signed)
Pt. reports mid  chest pain radiating to back of neck onset this evening , denies SOB / nausea or diaphoresis.

## 2013-08-25 ENCOUNTER — Emergency Department (HOSPITAL_COMMUNITY): Payer: BC Managed Care – PPO

## 2013-08-25 ENCOUNTER — Emergency Department (HOSPITAL_COMMUNITY)
Admission: EM | Admit: 2013-08-25 | Discharge: 2013-08-25 | Disposition: A | Discharge status: Home / Self Care | Payer: BC Managed Care – PPO | Attending: Emergency Medicine | Admitting: Emergency Medicine

## 2013-08-25 DIAGNOSIS — R519 Headache, unspecified: Secondary | ICD-10-CM

## 2013-08-25 DIAGNOSIS — M791 Myalgia, unspecified site: Secondary | ICD-10-CM

## 2013-08-25 DIAGNOSIS — M329 Systemic lupus erythematosus, unspecified: Secondary | ICD-10-CM

## 2013-08-25 DIAGNOSIS — R51 Headache: Secondary | ICD-10-CM

## 2013-08-25 DIAGNOSIS — Z7901 Long term (current) use of anticoagulants: Secondary | ICD-10-CM

## 2013-08-25 DIAGNOSIS — R079 Chest pain, unspecified: Secondary | ICD-10-CM

## 2013-08-25 LAB — CBC
HCT: 35.8 % — ABNORMAL LOW (ref 36.0–46.0)
HEMOGLOBIN: 11.2 g/dL — AB (ref 12.0–15.0)
MCH: 26.9 pg (ref 26.0–34.0)
MCHC: 31.3 g/dL (ref 30.0–36.0)
MCV: 85.9 fL (ref 78.0–100.0)
Platelets: 254 10*3/uL (ref 150–400)
RBC: 4.17 MIL/uL (ref 3.87–5.11)
RDW: 14.4 % (ref 11.5–15.5)
WBC: 7.8 10*3/uL (ref 4.0–10.5)

## 2013-08-25 LAB — PROTIME-INR
INR: 3.6 — ABNORMAL HIGH (ref 0.00–1.49)
PROTHROMBIN TIME: 35.9 s — AB (ref 11.6–15.2)

## 2013-08-25 LAB — BASIC METABOLIC PANEL
BUN: 8 mg/dL (ref 6–23)
CO2: 27 mEq/L (ref 19–32)
Calcium: 8.6 mg/dL (ref 8.4–10.5)
Chloride: 99 mEq/L (ref 96–112)
Creatinine, Ser: 0.91 mg/dL (ref 0.50–1.10)
GFR calc Af Amer: >90 mL/min (ref >90)
GFR calc non Af Amer: 81 mL/min — ABNORMAL LOW (ref >90)
Glucose, Bld: 111 mg/dL — ABNORMAL HIGH (ref 70–99)
POTASSIUM: 4.1 meq/L (ref 3.7–5.3)
Sodium: 137 mEq/L (ref 137–147)

## 2013-08-25 LAB — I-STAT TROPONIN, ED: Troponin i, poc: 0 ng/mL (ref 0.00–0.08)

## 2013-08-25 MED ORDER — METOCLOPRAMIDE HCL 5 MG/ML IJ SOLN
10.0000 mg | Freq: Once | INTRAMUSCULAR | Status: AC
  Administered 2013-08-25: 10 mg via INTRAVENOUS
  Filled 2013-08-25: qty 2

## 2013-08-25 MED ORDER — FENTANYL CITRATE 0.05 MG/ML IJ SOLN
50.0000 ug | Freq: Once | INTRAMUSCULAR | Status: AC
  Administered 2013-08-25: 50 ug via INTRAVENOUS
  Filled 2013-08-25: qty 2

## 2013-08-25 MED ORDER — SODIUM CHLORIDE 0.9 % IV BOLUS (SEPSIS)
1000.0000 mL | Freq: Once | INTRAVENOUS | Status: AC
  Administered 2013-08-25: 1000 mL via INTRAVENOUS

## 2013-08-25 MED ORDER — MORPHINE SULFATE 4 MG/ML IJ SOLN
4.0000 mg | Freq: Once | INTRAMUSCULAR | Status: AC
  Administered 2013-08-25: 4 mg via INTRAVENOUS
  Filled 2013-08-25: qty 1

## 2013-08-25 NOTE — ED Notes (Signed)
Pt reports right side CP x 2 days that radiates down right arm. Also reports head pressure that radiates to back of neck. Pt has history of Lupus and has CP when she has a flare up. Also has a history of migraines, but states this is a different presentation. However, pt has taken nothing for CP or headache. Pt is alert and oriented x 4, neuro intact.

## 2013-08-25 NOTE — ED Provider Notes (Signed)
CSN: [id number]     Arrival date & time 08/24/13  2316 History   First MD Initiated Contact with Patient 08/25/13 0344     Chief Complaint  Patient presents with  . Chest Pain     (Consider location/radiation/quality/duration/timing/severity/associated sxs/prior Treatment) HPI 33 yo woman with SLE, fibromyalgia, HTN, osteoarthritis and history of PE. She presents with complaints of headache which is bilateral and frontal. It is pressure like. The patient has some right ear pain. This began yesterday. No h/o trauma. No fever. No nasal congestion.   The patient also complains of chest pain which is sharp and radiates from the mid chest to the right arm. It is moderate in severity. Nothing makes it worse or better. Sx began yesterday.   Patient also complains of tingling and numbness in her right hand.    Past Medical History  Diagnosis Date  . Hypertension   . Arthritis   . Lupus   . Fibromyalgia   . Anemia   . PE (pulmonary embolism) 2009   Past Surgical History  Procedure Laterality Date  . Cholecystectomy    . Tubal ligation    . Dilation and curettage of uterus    . Ablation     Family History  Problem Relation Age of Onset  . Stroke Mother   . Pseudochol deficiency Neg Hx   . Malignant hyperthermia Neg Hx   . Hypotension Neg Hx   . Anesthesia problems Neg Hx   . Cancer Other   . Diabetes Other    History  Substance Use Topics  . Smoking status: Never Smoker   . Smokeless tobacco: Never Used  . Alcohol Use: No   OB History   Grav Para Term Preterm Abortions TAB SAB Ect Mult Living   4 2  2 2     2      Review of Systems  Ten point review of symptoms performed and is negative with the exception of symptoms noted above.   Allergies  Codeine; Compazine; Ivp dye; Nitrofurantoin monohyd macro; Penicillins; and Sulfasalazine  Home Medications   Prior to Admission medications   Medication Sig Start Date End Date Taking? Authorizing Provider  Buprenorphine  (BUTRANS) 7.5 MCG/HR PTWK Place 1 patch onto the skin every 7 (seven) days. Tuesday   Yes Historical Provider, MD  DULoxetine (CYMBALTA) 30 MG capsule Take 60 mg by mouth at bedtime.    Yes Historical Provider, MD  hydroxychloroquine (PLAQUENIL) 200 MG tablet Take 200 mg by mouth 2 (two) times daily.   Yes Historical Provider, MD  lisinopril-hydrochlorothiazide (PRINZIDE,ZESTORETIC) 10-12.5 MG per tablet Take 1 tablet by mouth daily.   Yes Historical Provider, MD  nortriptyline (PAMELOR) 75 MG capsule Take 75 mg by mouth at bedtime.    Yes Historical Provider, MD  ranitidine (ZANTAC) 150 MG tablet Take 150 mg by mouth 2 (two) times daily.   Yes Historical Provider, MD  warfarin (COUMADIN) 7.5 MG tablet Take 7.5 mg by mouth daily.   Yes Historical Provider, MD  ziprasidone (GEODON) 80 MG capsule Take 80 mg by mouth at bedtime.   Yes Historical Provider, MD  zolpidem (AMBIEN) 10 MG tablet Take 10 mg by mouth at bedtime as needed. For sleep   Yes Historical Provider, MD   BP 117/83  Pulse 91  Temp(Src) 98.9 F (37.2 C)  Resp 15  SpO2 100% Physical Exam Gen: well developed and well nourished appearing Head: NCAT Eyes: PERL, EOMI Nose: no epistaixis or rhinorrhea Mouth/throat: mucosa is moist  and pink Neck: supple, no stridor, no meningeal signs Lungs: CTA B, no wheezing, rhonchi or rales CV: RRR, no murmur, extremities appear well perfused.  Abd: soft, notender, nondistended Back: no ttp, no cva ttp Skin: warm and dry Ext: normal to inspection, no dependent edema Neuro: CN ii-xii grossly intact, no focal deficits, motor strength 5/5 both arms and legs.  Psyche; normal affect,  calm and cooperative.   ED Course  Procedures (including critical care time) Labs Review  Results for orders placed during the hospital encounter of 08/25/13 (from the past 24 hour(s))  CBC     Status: Abnormal   Collection Time    08/25/13 12:39 AM      Result Value Ref Range   WBC 7.8  4.0 - 10.5 K/uL    RBC 4.17  3.87 - 5.11 MIL/uL   Hemoglobin 11.2 (*) 12.0 - 15.0 g/dL   HCT 42.5 (*) 95.6 - 38.7 %   MCV 85.9  78.0 - 100.0 fL   MCH 26.9  26.0 - 34.0 pg   MCHC 31.3  30.0 - 36.0 g/dL   RDW 56.4  33.2 - 95.1 %   Platelets 254  150 - 400 K/uL  BASIC METABOLIC PANEL     Status: Abnormal   Collection Time    08/25/13 12:39 AM      Result Value Ref Range   Sodium 137  137 - 147 mEq/L   Potassium 4.1  3.7 - 5.3 mEq/L   Chloride 99  96 - 112 mEq/L   CO2 27  19 - 32 mEq/L   Glucose, Bld 111 (*) 70 - 99 mg/dL   BUN 8  6 - 23 mg/dL   Creatinine, Ser 8.84  0.50 - 1.10 mg/dL   Calcium 8.6  8.4 - 16.6 mg/dL   GFR calc non Af Amer 81 (*) >90 mL/min   GFR calc Af Amer >90  >90 mL/min  I-STAT TROPOININ, ED     Status: None   Collection Time    08/25/13 12:43 AM      Result Value Ref Range   Troponin i, poc 0.00  0.00 - 0.08 ng/mL   Comment 3           PROTIME-INR     Status: Abnormal   Collection Time    08/25/13  4:13 AM      Result Value Ref Range   Prothrombin Time 35.9 (*) 11.6 - 15.2 seconds   INR 3.60 (*) 0.00 - 1.49    Imaging Review Dg Chest 2 View  08/25/2013   CLINICAL DATA:  Chest pain  EXAM: CHEST  2 VIEW  COMPARISON:  08/10/2013  FINDINGS: The heart size and mediastinal contours are within normal limits. Both lungs are clear. The visualized skeletal structures are unremarkable.  IMPRESSION: No active cardiopulmonary disease.   Electronically Signed   By: Signa Kell M.D.   On: 08/25/2013 00:22    EKG: sinus tach, 103 bpm, no acute ischemic changes, normal intervals, normal axis, normal qrs complex  MDM   Patient with multiple pain complaints. We are managing symptomatically. Upon recheck at 0530, patient says she is feeling better but still has pressure like discomfort in the back of her head and would like some more pain medication. Her labs are notable for INR of 3.6. Otherwise, unremarkable  CT brain performed and is negative for acute intracranial process.  On  re-examination, the patient is feeling better. We have ruled out acute life and  limb threatening disorders. The patient is stable for discharge and feels comfortable with plan for symptomatic management and outpatient f/u in the next 48 hrs. We have discussed return precautions.   Brandt Loosen, MD 08/26/13 [phone]

## 2013-09-10 DIAGNOSIS — E669 Obesity, unspecified: Secondary | ICD-10-CM | POA: Insufficient documentation

## 2013-09-10 DIAGNOSIS — I639 Cerebral infarction, unspecified: Secondary | ICD-10-CM | POA: Insufficient documentation

## 2013-09-10 DIAGNOSIS — F3170 Bipolar disorder, currently in remission, most recent episode unspecified: Secondary | ICD-10-CM | POA: Insufficient documentation

## 2013-09-10 DIAGNOSIS — L659 Nonscarring hair loss, unspecified: Secondary | ICD-10-CM | POA: Insufficient documentation

## 2013-09-10 HISTORY — DX: Nonscarring hair loss, unspecified: L65.9

## 2013-09-10 HISTORY — DX: Bipolar disorder, currently in remission, most recent episode unspecified: F31.70

## 2013-09-10 HISTORY — DX: Cerebral infarction, unspecified: I63.9

## 2013-11-02 ENCOUNTER — Emergency Department (HOSPITAL_COMMUNITY): Payer: BC Managed Care – PPO

## 2013-11-02 ENCOUNTER — Encounter (HOSPITAL_COMMUNITY): Payer: Self-pay | Admitting: Emergency Medicine

## 2013-11-02 DIAGNOSIS — Z3202 Encounter for pregnancy test, result negative: Secondary | ICD-10-CM | POA: Insufficient documentation

## 2013-11-02 DIAGNOSIS — Z880 Allergy status to penicillin: Secondary | ICD-10-CM | POA: Insufficient documentation

## 2013-11-02 DIAGNOSIS — IMO0001 Reserved for inherently not codable concepts without codable children: Secondary | ICD-10-CM | POA: Insufficient documentation

## 2013-11-02 DIAGNOSIS — R079 Chest pain, unspecified: Secondary | ICD-10-CM | POA: Diagnosis present

## 2013-11-02 DIAGNOSIS — R0789 Other chest pain: Secondary | ICD-10-CM | POA: Insufficient documentation

## 2013-11-02 DIAGNOSIS — Z7901 Long term (current) use of anticoagulants: Secondary | ICD-10-CM | POA: Insufficient documentation

## 2013-11-02 DIAGNOSIS — Z79899 Other long term (current) drug therapy: Secondary | ICD-10-CM | POA: Diagnosis not present

## 2013-11-02 DIAGNOSIS — R51 Headache: Secondary | ICD-10-CM | POA: Insufficient documentation

## 2013-11-02 DIAGNOSIS — Z86711 Personal history of pulmonary embolism: Secondary | ICD-10-CM | POA: Insufficient documentation

## 2013-11-02 DIAGNOSIS — Z8739 Personal history of other diseases of the musculoskeletal system and connective tissue: Secondary | ICD-10-CM | POA: Insufficient documentation

## 2013-11-02 DIAGNOSIS — R42 Dizziness and giddiness: Secondary | ICD-10-CM | POA: Insufficient documentation

## 2013-11-02 DIAGNOSIS — I10 Essential (primary) hypertension: Secondary | ICD-10-CM | POA: Insufficient documentation

## 2013-11-02 DIAGNOSIS — M329 Systemic lupus erythematosus, unspecified: Secondary | ICD-10-CM | POA: Insufficient documentation

## 2013-11-02 DIAGNOSIS — Z862 Personal history of diseases of the blood and blood-forming organs and certain disorders involving the immune mechanism: Secondary | ICD-10-CM | POA: Diagnosis not present

## 2013-11-02 LAB — CBC WITH DIFFERENTIAL/PLATELET
Basophils Absolute: 0 10*3/uL (ref 0.0–0.1)
Basophils Relative: 0 % (ref 0–1)
EOS PCT: 3 % (ref 0–5)
Eosinophils Absolute: 0.3 10*3/uL (ref 0.0–0.7)
HCT: 36.9 % (ref 36.0–46.0)
HEMOGLOBIN: 11.8 g/dL — AB (ref 12.0–15.0)
LYMPHS ABS: 3.2 10*3/uL (ref 0.7–4.0)
Lymphocytes Relative: 32 % (ref 12–46)
MCH: 27 pg (ref 26.0–34.0)
MCHC: 32 g/dL (ref 30.0–36.0)
MCV: 84.4 fL (ref 78.0–100.0)
MONOS PCT: 5 % (ref 3–12)
Monocytes Absolute: 0.4 10*3/uL (ref 0.1–1.0)
Neutro Abs: 5.9 10*3/uL (ref 1.7–7.7)
Neutrophils Relative %: 60 % (ref 43–77)
Platelets: 242 10*3/uL (ref 150–400)
RBC: 4.37 MIL/uL (ref 3.87–5.11)
RDW: 14.4 % (ref 11.5–15.5)
WBC: 9.9 10*3/uL (ref 4.0–10.5)

## 2013-11-02 LAB — I-STAT CHEM 8, ED
BUN: 10 mg/dL (ref 6–23)
CHLORIDE: 101 meq/L (ref 96–112)
CREATININE: 1.1 mg/dL (ref 0.50–1.10)
Calcium, Ion: 1.15 mmol/L (ref 1.12–1.23)
Glucose, Bld: 143 mg/dL — ABNORMAL HIGH (ref 70–99)
HCT: 42 % (ref 36.0–46.0)
Hemoglobin: 14.3 g/dL (ref 12.0–15.0)
POTASSIUM: 4 meq/L (ref 3.7–5.3)
SODIUM: 135 meq/L — AB (ref 137–147)
TCO2: 25 mmol/L (ref 0–100)

## 2013-11-02 LAB — I-STAT TROPONIN, ED: Troponin i, poc: 0 ng/mL (ref 0.00–0.08)

## 2013-11-02 LAB — PROTIME-INR
INR: 2.61 — AB (ref 0.00–1.49)
Prothrombin Time: 27.9 seconds — ABNORMAL HIGH (ref 11.6–15.2)

## 2013-11-02 NOTE — ED Notes (Addendum)
C/o CP, describes as tightness, also sob,dizziness, "migraine HA" and nausea. onset yesterday, constant. (denies: vd, bleeding or fever), no meds PTA. No meds taken today. Takes coumadin for past PE. Denies BC, recent travel or leg pain.

## 2013-11-02 NOTE — ED Notes (Signed)
Triage by [facility], RN, (not DH, RN)

## 2013-11-03 ENCOUNTER — Emergency Department (HOSPITAL_COMMUNITY)
Admission: EM | Admit: 2013-11-03 | Discharge: 2013-11-03 | Disposition: A | Discharge status: Home / Self Care | Payer: BC Managed Care – PPO | Attending: Emergency Medicine | Admitting: Emergency Medicine

## 2013-11-03 DIAGNOSIS — R51 Headache: Secondary | ICD-10-CM

## 2013-11-03 DIAGNOSIS — R519 Headache, unspecified: Secondary | ICD-10-CM

## 2013-11-03 DIAGNOSIS — R079 Chest pain, unspecified: Secondary | ICD-10-CM

## 2013-11-03 LAB — I-STAT TROPONIN, ED: TROPONIN I, POC: 0 ng/mL (ref 0.00–0.08)

## 2013-11-03 LAB — POC URINE PREG, ED: Preg Test, Ur: NEGATIVE

## 2013-11-03 MED ORDER — SODIUM CHLORIDE 0.9 % IV BOLUS (SEPSIS)
500.0000 mL | Freq: Once | INTRAVENOUS | Status: AC
  Administered 2013-11-03: 500 mL via INTRAVENOUS

## 2013-11-03 MED ORDER — DIPHENHYDRAMINE HCL 50 MG/ML IJ SOLN
25.0000 mg | Freq: Once | INTRAMUSCULAR | Status: AC
  Administered 2013-11-03: 25 mg via INTRAVENOUS
  Filled 2013-11-03: qty 1

## 2013-11-03 MED ORDER — METOCLOPRAMIDE HCL 10 MG PO TABS: 10.0000 mg | ORAL_TABLET | Freq: Four times a day (QID) | ORAL | Status: DC

## 2013-11-03 MED ORDER — FENTANYL CITRATE 0.05 MG/ML IJ SOLN
75.0000 ug | INTRAMUSCULAR | Status: DC | PRN
  Administered 2013-11-03: 75 ug via INTRAVENOUS
  Filled 2013-11-03: qty 2

## 2013-11-03 MED ORDER — DEXAMETHASONE SODIUM PHOSPHATE 10 MG/ML IJ SOLN
10.0000 mg | Freq: Once | INTRAMUSCULAR | Status: AC
  Administered 2013-11-03: 10 mg via INTRAVENOUS
  Filled 2013-11-03: qty 1

## 2013-11-03 MED ORDER — METOCLOPRAMIDE HCL 5 MG/ML IJ SOLN
10.0000 mg | Freq: Once | INTRAMUSCULAR | Status: AC
  Administered 2013-11-03: 10 mg via INTRAVENOUS
  Filled 2013-11-03: qty 2

## 2013-11-03 NOTE — Discharge Instructions (Signed)
If you were given medicines take as directed.  If you are on coumadin or contraceptives realize their levels and effectiveness is altered by many different medicines.  If you have any reaction (rash, tongues swelling, other) to the medicines stop taking and see a physician.   Try reglan with benadryl for headaches.  Please follow up as directed and return to the ER or see a physician for new or worsening symptoms.  Thank you. Filed Vitals:   11/02/13 2059 11/03/13 0027 11/03/13 0154 11/03/13 0200  BP:  136/75 112/72 121/73  Pulse:  93 97 92  Temp:      TempSrc:      Resp:  21 18 17   Height: 5\' 7"  (1.702 m)     Weight: 234 lb (106.142 kg)     SpO2:  100% 100% 100%

## 2013-11-03 NOTE — ED Provider Notes (Signed)
CSN: [id number]     Arrival date & time 11/02/13  2043 History   First MD Initiated Contact with Patient 11/03/13 0023     Chief Complaint  Patient presents with  . Chest Pain     (Consider location/radiation/quality/duration/timing/severity/associated sxs/prior Treatment) HPI Comments: 33 year old female with history of lupus, fibromyalgia, high blood pressure, pulmonary was him on Coumadin presents with headache and chest tightness. Patient has had gradual onset headache for the past 2 days, constant, similar previous, no head injuries, no fevers or neck stiffness, patient has had CT of her head for similar in the past which was told was unremarkable. Patient has also had mild chest tightness when the headache gets severe, nonradiating, different than symptoms with blood clot Patient denies shortness of breath, leg swelling or leg pain. patient currently does not have any chest discomfort. Patient has tried steroids for these headaches with mild improvement.  Patient is a 33 y.o. female presenting with chest pain. The history is provided by the patient.  Chest Pain Associated symptoms: headache   Associated symptoms: no abdominal pain, no back pain, no fever, no numbness, no shortness of breath, not vomiting and no weakness     Past Medical History  Diagnosis Date  . Hypertension   . Arthritis   . Lupus   . Fibromyalgia   . Anemia   . PE (pulmonary embolism) 2009   Past Surgical History  Procedure Laterality Date  . Cholecystectomy    . Tubal ligation    . Dilation and curettage of uterus    . Ablation     Family History  Problem Relation Age of Onset  . Stroke Mother   . Pseudochol deficiency Neg Hx   . Malignant hyperthermia Neg Hx   . Hypotension Neg Hx   . Anesthesia problems Neg Hx   . Cancer Other   . Diabetes Other    History  Substance Use Topics  . Smoking status: Never Smoker   . Smokeless tobacco: Never Used  . Alcohol Use: No   OB History   Grav Para  Term Preterm Abortions TAB SAB Ect Mult Living   Review of Systems  Constitutional: Negative for fever and chills.  HENT: Negative for congestion.   Eyes: Negative for visual disturbance.  Respiratory: Negative for shortness of breath.   Cardiovascular: Positive for chest pain. Negative for leg swelling.  Gastrointestinal: Negative for vomiting and abdominal pain.  Genitourinary: Negative for dysuria and flank pain.  Musculoskeletal: Negative for back pain, neck pain and neck stiffness.  Skin: Negative for rash.  Neurological: Positive for light-headedness and headaches. Negative for syncope, weakness and numbness.      Allergies  Codeine; Compazine; Ivp dye; Nitrofurantoin monohyd macro; Penicillins; and Sulfasalazine  Home Medications   Prior to Admission medications   Medication Sig Start Date End Date Taking? Authorizing Provider  DULoxetine (CYMBALTA) 30 MG capsule Take 60 mg by mouth at bedtime.    Yes Historical Provider, MD  fluticasone (FLONASE) 50 MCG/ACT nasal spray Place 2 sprays into both nostrils daily as needed for allergies or rhinitis.   Yes Historical Provider, MD  hydroxychloroquine (PLAQUENIL) 200 MG tablet Take 200 mg by mouth 2 (two) times daily.   Yes Historical Provider, MD  lisinopril-hydrochlorothiazide (PRINZIDE,ZESTORETIC) 10-12.5 MG per tablet Take 1 tablet by mouth daily.   Yes Historical Provider, MD  nortriptyline (PAMELOR) 75 MG capsule Take 75 mg by  mouth at bedtime.    Yes Historical Provider, MD  ranitidine (ZANTAC) 150 MG tablet Take 150 mg by mouth 2 (two) times daily.   Yes Historical Provider, MD  warfarin (COUMADIN) 7.5 MG tablet Take 5-7.5 mg by mouth daily. Saturday and Sunday take 5mg . All other days take 7.5mg    Yes Historical Provider, MD  ziprasidone (GEODON) 80 MG capsule Take 80 mg by mouth at bedtime.   Yes Historical Provider, MD  zolpidem (AMBIEN) 10 MG tablet Take 10 mg by mouth at bedtime as needed. For sleep    Yes Historical Provider, MD   BP 112/72  Pulse 97  Temp(Src) 99.4 F (37.4 C) (Oral)  Resp 18  Ht 5\' 7"  (1.702 m)  Wt 234 lb (106.142 kg)  BMI 36.64 kg/m2  SpO2 100% Physical Exam  Nursing note and vitals reviewed. Constitutional: She is oriented to person, place, and time. She appears well-developed and well-nourished.  HENT:  Head: Normocephalic and atraumatic.  Eyes: Conjunctivae are normal. Right eye exhibits no discharge. Left eye exhibits no discharge.  Neck: Normal range of motion. Neck supple. No tracheal deviation present.  Cardiovascular: Normal rate, regular rhythm and intact distal pulses.   Pulmonary/Chest: Effort normal and breath sounds normal.  Abdominal: Soft. She exhibits no distension. There is no tenderness. There is no guarding.  Musculoskeletal: She exhibits no edema.  Neurological: She is alert and oriented to person, place, and time. GCS eye subscore is 4. GCS verbal subscore is 5. GCS motor subscore is 6.  5+ strength in UE and LE with f/e at major joints. Sensation to palpation intact in UE and LE. CNs 2-12 grossly intact.  EOMFI.  PERRL.   Finger nose and coordination intact bilateral.   Visual fields intact to finger testing.   Skin: Skin is warm. No rash noted.  Psychiatric: She has a normal mood and affect.    ED Course  Procedures (including critical care time) Labs Review Labs Reviewed  CBC WITH DIFFERENTIAL - Abnormal; Notable for the following:    Hemoglobin 11.8 (*)    All other components within normal limits  PROTIME-INR - Abnormal; Notable for the following:    Prothrombin Time 27.9 (*)    INR 2.61 (*)    All other components within normal limits  I-STAT CHEM 8, ED - Abnormal; Notable for the following:    Sodium 135 (*)    Glucose, Bld 143 (*)    All other components within normal limits  I-STAT TROPOININ, ED  POC URINE PREG, ED  , ED    Imaging Review Dg Chest 2 View  11/02/2013   CLINICAL DATA:  Chest pain   EXAM: CHEST  2 VIEW  COMPARISON:  08/24/2013  FINDINGS: Lungs are clear.  No pleural effusion or pneumothorax.  The heart is normal in size.  Mild degenerative changes of the visualized thoracolumbar spine.  Cholecystectomy clips.  IMPRESSION: No evidence of acute cardiopulmonary disease.   Electronically Signed   By: 01/02/2014 M.D.   On: 11/02/2013 23:02     EKG Interpretation   Date/Time:  Sunday November 02 2013 20:49:39 EDT Ventricular Rate:  100 PR Interval:  144 QRS Duration: 86 QT Interval:  306 QTC Calculation: 394 R Axis:   -12 Text Interpretation:  Normal sinus rhythm Nonspecific T wave abnormality  Abnormal ECG overall similar previous Confirmed by Stanton Kissoon  MD, Zhion Pevehouse  ([id number]) on 11/03/2013 12:23:48 AM      MDM   Final diagnoses:  Headache, unspecified headache type  Chest pain, unspecified chest pain type   Patient with atypical intermittent chest tightness, fairly constant since yesterday, patient low risk cardiac, plan for delta troponin, EKG reviewed no acute findings.  Headache similar to multiple previous episodes, normal neuro exam, patient has outpatient followup for her headache, migraine cocktail ordered.  On recheck patient feels improved however headache is certainly come back, fentanyl ordered. Patient has had a CT and MRI outpatient for similar headache she says. Cardiac screen negative, patient on Coumadin for blood clot history. Her chest symptoms have resolved however mild headache persists. Discussed supportive care and treatment options with outpatient followup. Results and differential diagnosis were discussed with the patient/parent/guardian. Close follow up outpatient was discussed, comfortable with the plan.   Medications  sodium chloride 0.9 % bolus 500 mL (500 mLs Intravenous New Bag/Given 11/03/13 0144)  metoCLOPramide (REGLAN) injection 10 mg (10 mg Intravenous Given 11/03/13 0145)  diphenhydrAMINE (BENADRYL) injection 25 mg (25 mg  Intravenous Given 11/03/13 0147)  dexamethasone (DECADRON) injection 10 mg (10 mg Intravenous Given 11/03/13 0149)    Filed Vitals:   11/02/13 2057 11/02/13 2059 11/03/13 0027 11/03/13 0154  BP: 138/86  136/75 112/72  Pulse: 100  93 97  Temp: 99.4 F (37.4 C)     TempSrc: Oral     Resp: 20  21 18   Height:  5\' 7"  (1.702 m)    Weight:  234 lb (106.142 kg)    SpO2: 100%  100% 100%    \    , MD 11/03/13 [phone]

## 2013-11-03 NOTE — ED Notes (Signed)
Pt ambulated to bathroom, steady gait. PT's husband accompanying pt to bathroom for safety.

## 2013-11-03 NOTE — ED Notes (Signed)
PT monitored by pulse ox, bp cuff, and 5-lead. 

## 2013-11-03 NOTE — ED Notes (Signed)
The pt reports that her headache is only sl better

## 2013-11-03 NOTE — ED Notes (Signed)
D/c delayed.  Female with pt wants  anote for  Work waiting on th edp

## 2013-12-29 ENCOUNTER — Encounter (HOSPITAL_COMMUNITY): Payer: Self-pay | Admitting: Emergency Medicine

## 2014-01-30 ENCOUNTER — Other Ambulatory Visit (HOSPITAL_COMMUNITY): Payer: Self-pay | Admitting: Respiratory Therapy

## 2014-01-30 DIAGNOSIS — G4730 Sleep apnea, unspecified: Secondary | ICD-10-CM

## 2014-02-27 DIAGNOSIS — Z794 Long term (current) use of insulin: Secondary | ICD-10-CM | POA: Insufficient documentation

## 2014-02-27 HISTORY — DX: Type 2 diabetes mellitus without complications: Z79.4

## 2014-03-27 ENCOUNTER — Institutional Professional Consult (permissible substitution): Payer: Self-pay | Admitting: Internal Medicine

## 2014-04-23 ENCOUNTER — Institutional Professional Consult (permissible substitution): Payer: Self-pay | Admitting: Internal Medicine

## 2014-06-02 ENCOUNTER — Institutional Professional Consult (permissible substitution): Payer: Self-pay | Admitting: Internal Medicine

## 2014-08-20 ENCOUNTER — Emergency Department (HOSPITAL_COMMUNITY)
Admission: EM | Admit: 2014-08-20 | Discharge: 2014-08-20 | Disposition: A | Discharge status: Home / Self Care | Payer: Medicare Other | Attending: Emergency Medicine | Admitting: Emergency Medicine

## 2014-08-20 ENCOUNTER — Encounter (HOSPITAL_COMMUNITY): Payer: Self-pay | Admitting: *Deleted

## 2014-08-20 DIAGNOSIS — Z862 Personal history of diseases of the blood and blood-forming organs and certain disorders involving the immune mechanism: Secondary | ICD-10-CM | POA: Diagnosis not present

## 2014-08-20 DIAGNOSIS — I10 Essential (primary) hypertension: Secondary | ICD-10-CM | POA: Diagnosis not present

## 2014-08-20 DIAGNOSIS — M1990 Unspecified osteoarthritis, unspecified site: Secondary | ICD-10-CM | POA: Insufficient documentation

## 2014-08-20 DIAGNOSIS — E669 Obesity, unspecified: Secondary | ICD-10-CM | POA: Diagnosis not present

## 2014-08-20 DIAGNOSIS — Z79899 Other long term (current) drug therapy: Secondary | ICD-10-CM | POA: Insufficient documentation

## 2014-08-20 DIAGNOSIS — Z7901 Long term (current) use of anticoagulants: Secondary | ICD-10-CM | POA: Insufficient documentation

## 2014-08-20 DIAGNOSIS — M545 Low back pain: Secondary | ICD-10-CM | POA: Diagnosis present

## 2014-08-20 DIAGNOSIS — M5416 Radiculopathy, lumbar region: Secondary | ICD-10-CM | POA: Insufficient documentation

## 2014-08-20 DIAGNOSIS — Z86711 Personal history of pulmonary embolism: Secondary | ICD-10-CM | POA: Diagnosis not present

## 2014-08-20 MED ORDER — IBUPROFEN 600 MG PO TABS: 600.0000 mg | ORAL_TABLET | Freq: Four times a day (QID) | ORAL | Status: DC | PRN

## 2014-08-20 MED ORDER — METHOCARBAMOL 500 MG PO TABS: 1000.0000 mg | ORAL_TABLET | Freq: Three times a day (TID) | ORAL | Status: DC | PRN

## 2014-08-20 MED ORDER — OXYCODONE-ACETAMINOPHEN 5-325 MG PO TABS: 2.0000 | ORAL_TABLET | ORAL | Status: DC | PRN

## 2014-08-20 MED ORDER — METHOCARBAMOL 500 MG PO TABS
1000.0000 mg | ORAL_TABLET | Freq: Once | ORAL | Status: AC
  Administered 2014-08-20: 1000 mg via ORAL
  Filled 2014-08-20: qty 2

## 2014-08-20 MED ORDER — OXYCODONE-ACETAMINOPHEN 5-325 MG PO TABS
2.0000 | ORAL_TABLET | Freq: Once | ORAL | Status: AC
Start: 2014-08-20 — End: 2014-08-20
  Administered 2014-08-20: 2 via ORAL
  Filled 2014-08-20: qty 2

## 2014-08-20 MED ORDER — KETOROLAC TROMETHAMINE 60 MG/2ML IM SOLN
60.0000 mg | Freq: Once | INTRAMUSCULAR | Status: AC
  Administered 2014-08-20: 60 mg via INTRAMUSCULAR
  Filled 2014-08-20: qty 2

## 2014-08-20 NOTE — ED Notes (Signed)
MD at bedside. 

## 2014-08-20 NOTE — Discharge Instructions (Signed)

## 2014-08-20 NOTE — ED Provider Notes (Signed)
CSN: [id number]     Arrival date & time 08/20/14  0216 History  This chart was scribed for Loren Racer, MD by Bronson Curb, ED Scribe. This patient was seen in room B15C/B15C and the patient's care was started at 4:11 AM.   Chief Complaint  Patient presents with  . Back Pain  . Leg Pain    The history is provided by the patient. No language interpreter was used.     HPI Comments: Haley Goodman is a 34 y.o. female who presents to the Emergency Department complaining of constant, 10/10  lower back pain with radiation down the left leg to the back of the left heel for the past 3 days. Patient reports history of sciatica and has taken Tramadol, nortriptyline, and Motrin without relief. She states she was given steroids 3-4 days ago without significant improvement. She denies any recent falls, injury, or trauma. She further denies bowel/bladder incontinence. No numbness or weakness.   Past Medical History  Diagnosis Date  . Hypertension   . Arthritis   . Lupus   . Fibromyalgia   . Anemia   . PE (pulmonary embolism) 2009   Past Surgical History  Procedure Laterality Date  . Cholecystectomy    . Tubal ligation    . Dilation and curettage of uterus    . Ablation     Family History  Problem Relation Age of Onset  . Stroke Mother   . Pseudochol deficiency Neg Hx   . Malignant hyperthermia Neg Hx   . Hypotension Neg Hx   . Anesthesia problems Neg Hx   . Cancer Other   . Diabetes Other    History  Substance Use Topics  . Smoking status: Never Smoker   . Smokeless tobacco: Never Used  . Alcohol Use: No   OB History    Gravida Para Term Preterm AB TAB SAB Ectopic Multiple Living   4 2  2 2     2      Review of Systems  Constitutional: Negative for fever and chills.  Respiratory: Negative for shortness of breath.   Cardiovascular: Negative for chest pain.  Gastrointestinal: Negative for nausea, vomiting, abdominal pain and diarrhea.  Genitourinary: Negative  for dysuria, flank pain and difficulty urinating.  Musculoskeletal: Positive for back pain. Negative for neck pain and neck stiffness.  Skin: Negative for rash and wound.  Neurological: Negative for weakness and numbness.  All other systems reviewed and are negative.     Allergies  Codeine; Compazine; Ivp dye; Nitrofurantoin monohyd macro; Penicillins; and Sulfasalazine  Home Medications   Prior to Admission medications   Medication Sig Start Date End Date Taking? Authorizing Provider  DULoxetine (CYMBALTA) 30 MG capsule Take 60 mg by mouth at bedtime.     Historical Provider, MD  fluticasone (FLONASE) 50 MCG/ACT nasal spray Place 2 sprays into both nostrils daily as needed for allergies or rhinitis.    Historical Provider, MD  hydroxychloroquine (PLAQUENIL) 200 MG tablet Take 200 mg by mouth 2 (two) times daily.    Historical Provider, MD  ibuprofen (ADVIL,MOTRIN) 600 MG tablet Take 1 tablet (600 mg total) by mouth every 6 (six) hours as needed. 08/20/14   08/22/14, MD  lisinopril-hydrochlorothiazide (PRINZIDE,ZESTORETIC) 10-12.5 MG per tablet Take 1 tablet by mouth daily.    Historical Provider, MD  methocarbamol (ROBAXIN) 500 MG tablet Take 2 tablets (1,000 mg total) by mouth every 8 (eight) hours as needed. 08/20/14   08/22/14, MD  metoCLOPramide (REGLAN) 10 MG  tablet Take 1 tablet (10 mg total) by mouth every 6 (six) hours. 11/03/13   Blane Ohara, MD  nortriptyline (PAMELOR) 75 MG capsule Take 75 mg by mouth at bedtime.     Historical Provider, MD  oxyCODONE-acetaminophen (PERCOCET) 5-325 MG per tablet Take 2 tablets by mouth every 4 (four) hours as needed. 08/20/14   Loren Racer, MD  ranitidine (ZANTAC) 150 MG tablet Take 150 mg by mouth 2 (two) times daily.    Historical Provider, MD  warfarin (COUMADIN) 7.5 MG tablet Take 5-7.5 mg by mouth daily. Saturday and Sunday take 5mg . All other days take 7.5mg     Historical Provider, MD  ziprasidone (GEODON) 80 MG capsule  Take 80 mg by mouth at bedtime.    Historical Provider, MD  zolpidem (AMBIEN) 10 MG tablet Take 10 mg by mouth at bedtime as needed. For sleep    Historical Provider, MD   Triage Vitals: BP 113/68 mmHg  Pulse 101  Temp(Src) 98.5 F (36.9 C) (Oral)  Resp 22  Ht 5\' 7"  (1.702 m)  Wt 243 lb (110.224 kg)  BMI 38.05 kg/m2  SpO2 98%  Physical Exam  Constitutional: She is oriented to person, place, and time. She appears well-developed and well-nourished. No distress.  Obese  HENT:  Head: Normocephalic and atraumatic.  Mouth/Throat: Oropharynx is clear and moist.  Eyes: EOM are normal. Pupils are equal, round, and reactive to light.  Neck: Normal range of motion. Neck supple.  Cardiovascular: Normal rate and regular rhythm.   Pulmonary/Chest: Effort normal and breath sounds normal.  Abdominal: Soft. Bowel sounds are normal.  Musculoskeletal: Normal range of motion. She exhibits tenderness. She exhibits no edema.  Tenderness to palpation in the left paraspinal lumbar region. Positive straight leg raise. Distal pulses intact.  Neurological: She is alert and oriented to person, place, and time.  5/5 motor in all extremities. Exam somewhat limited by pain. Sensation is fully intact.  Skin: Skin is warm and dry. No rash noted. No erythema.  Psychiatric: She has a normal mood and affect. Her behavior is normal.  Nursing note and vitals reviewed.   ED Course  Procedures (including critical care time)  DIAGNOSTIC STUDIES: Oxygen Saturation is 98% on room air, normal by my interpretation.    COORDINATION OF CARE: At [phone] Discussed treatment plan with patient. Patient agrees.   Labs Review Labs Reviewed - No data to display  Imaging Review No results found.   EKG Interpretation None      MDM   Final diagnoses:  Lumbar radiculopathy    I personally performed the services described in this documentation, which was scribed in my presence. The recorded information has been  reviewed and is accurate.  Symptoms appear to be exacerbation of patient's chronic low back pain. No red flag signs or symptoms. She's been given neurosurgery follow-up. She's been advised to lose weight.   , MD 08/20/14 [phone]

## 2014-08-20 NOTE — ED Notes (Signed)
Pt c/o left lower back pain radiating down left leg. Pt unable to get pain relief at home.

## 2015-02-05 ENCOUNTER — Other Ambulatory Visit (HOSPITAL_COMMUNITY): Payer: Self-pay | Admitting: Respiratory Therapy

## 2015-02-05 DIAGNOSIS — M069 Rheumatoid arthritis, unspecified: Secondary | ICD-10-CM

## 2015-02-05 DIAGNOSIS — R0681 Apnea, not elsewhere classified: Secondary | ICD-10-CM

## 2015-02-05 DIAGNOSIS — R0683 Snoring: Secondary | ICD-10-CM

## 2015-02-05 DIAGNOSIS — G2581 Restless legs syndrome: Secondary | ICD-10-CM

## 2015-02-05 DIAGNOSIS — E119 Type 2 diabetes mellitus without complications: Secondary | ICD-10-CM

## 2015-02-05 DIAGNOSIS — K219 Gastro-esophageal reflux disease without esophagitis: Secondary | ICD-10-CM

## 2015-02-05 DIAGNOSIS — G4733 Obstructive sleep apnea (adult) (pediatric): Secondary | ICD-10-CM

## 2015-02-05 DIAGNOSIS — M3214 Glomerular disease in systemic lupus erythematosus: Secondary | ICD-10-CM

## 2015-02-17 ENCOUNTER — Emergency Department (HOSPITAL_COMMUNITY): Payer: Medicare Other

## 2015-02-17 ENCOUNTER — Emergency Department (HOSPITAL_COMMUNITY)
Admission: EM | Admit: 2015-02-17 | Discharge: 2015-02-17 | Disposition: A | Discharge status: Home / Self Care | Payer: Medicare Other | Attending: Emergency Medicine | Admitting: Emergency Medicine

## 2015-02-17 ENCOUNTER — Encounter (HOSPITAL_COMMUNITY): Payer: Self-pay | Admitting: Emergency Medicine

## 2015-02-17 DIAGNOSIS — Z880 Allergy status to penicillin: Secondary | ICD-10-CM | POA: Insufficient documentation

## 2015-02-17 DIAGNOSIS — M797 Fibromyalgia: Secondary | ICD-10-CM | POA: Diagnosis not present

## 2015-02-17 DIAGNOSIS — I10 Essential (primary) hypertension: Secondary | ICD-10-CM | POA: Insufficient documentation

## 2015-02-17 DIAGNOSIS — Z7901 Long term (current) use of anticoagulants: Secondary | ICD-10-CM | POA: Insufficient documentation

## 2015-02-17 DIAGNOSIS — R079 Chest pain, unspecified: Secondary | ICD-10-CM | POA: Diagnosis present

## 2015-02-17 DIAGNOSIS — D649 Anemia, unspecified: Secondary | ICD-10-CM | POA: Diagnosis not present

## 2015-02-17 DIAGNOSIS — Z7951 Long term (current) use of inhaled steroids: Secondary | ICD-10-CM | POA: Diagnosis not present

## 2015-02-17 DIAGNOSIS — Z79899 Other long term (current) drug therapy: Secondary | ICD-10-CM | POA: Insufficient documentation

## 2015-02-17 DIAGNOSIS — M1990 Unspecified osteoarthritis, unspecified site: Secondary | ICD-10-CM | POA: Diagnosis not present

## 2015-02-17 DIAGNOSIS — R791 Abnormal coagulation profile: Secondary | ICD-10-CM | POA: Diagnosis not present

## 2015-02-17 DIAGNOSIS — R0789 Other chest pain: Secondary | ICD-10-CM | POA: Diagnosis not present

## 2015-02-17 DIAGNOSIS — Z86711 Personal history of pulmonary embolism: Secondary | ICD-10-CM | POA: Diagnosis not present

## 2015-02-17 LAB — DIFFERENTIAL
BASOS PCT: 0 %
Basophils Absolute: 0 10*3/uL (ref 0.0–0.1)
EOS PCT: 2 %
Eosinophils Absolute: 0.1 10*3/uL (ref 0.0–0.7)
Lymphocytes Relative: 27 %
Lymphs Abs: 1.6 10*3/uL (ref 0.7–4.0)
MONO ABS: 0.3 10*3/uL (ref 0.1–1.0)
Monocytes Relative: 5 %
NEUTROS ABS: 4 10*3/uL (ref 1.7–7.7)
NEUTROS PCT: 66 %

## 2015-02-17 LAB — BASIC METABOLIC PANEL
Anion gap: 7 (ref 5–15)
BUN: 12 mg/dL (ref 6–20)
CHLORIDE: 108 mmol/L (ref 101–111)
CO2: 20 mmol/L — AB (ref 22–32)
CREATININE: 1.01 mg/dL — AB (ref 0.44–1.00)
Calcium: 8.8 mg/dL — ABNORMAL LOW (ref 8.9–10.3)
GFR calc Af Amer: >60 mL/min (ref >60)
GFR calc non Af Amer: >60 mL/min (ref >60)
Glucose, Bld: 145 mg/dL — ABNORMAL HIGH (ref 65–99)
Potassium: 4 mmol/L (ref 3.5–5.1)
Sodium: 135 mmol/L (ref 135–145)

## 2015-02-17 LAB — TROPONIN I: Troponin I: <0.03 ng/mL (ref <0.031)

## 2015-02-17 LAB — CBC
HCT: 34.6 % — ABNORMAL LOW (ref 36.0–46.0)
Hemoglobin: 11 g/dL — ABNORMAL LOW (ref 12.0–15.0)
MCH: 26.8 pg (ref 26.0–34.0)
MCHC: 31.8 g/dL (ref 30.0–36.0)
MCV: 84.2 fL (ref 78.0–100.0)
PLATELETS: 235 10*3/uL (ref 150–400)
RBC: 4.11 MIL/uL (ref 3.87–5.11)
RDW: 14.9 % (ref 11.5–15.5)
WBC: 6.1 10*3/uL (ref 4.0–10.5)

## 2015-02-17 LAB — PROTIME-INR
INR: 1.4 (ref 0.00–1.49)
PROTHROMBIN TIME: 17.3 s — AB (ref 11.6–15.2)

## 2015-02-17 LAB — I-STAT BETA HCG BLOOD, ED (MC, WL, AP ONLY)

## 2015-02-17 MED ORDER — ONDANSETRON HCL 4 MG/2ML IJ SOLN
4.0000 mg | Freq: Once | INTRAMUSCULAR | Status: AC
  Administered 2015-02-17: 4 mg via INTRAVENOUS
  Filled 2015-02-17: qty 2

## 2015-02-17 MED ORDER — DIPHENHYDRAMINE HCL 50 MG/ML IJ SOLN
50.0000 mg | Freq: Once | INTRAMUSCULAR | Status: AC
  Administered 2015-02-17: 50 mg via INTRAVENOUS
  Filled 2015-02-17: qty 1

## 2015-02-17 MED ORDER — OXYCODONE-ACETAMINOPHEN 5-325 MG PO TABS
1.0000 | ORAL_TABLET | Freq: Once | ORAL | Status: AC
  Administered 2015-02-17: 1 via ORAL
  Filled 2015-02-17: qty 1

## 2015-02-17 MED ORDER — OXYCODONE-ACETAMINOPHEN 5-325 MG PO TABS: 1.0000 | ORAL_TABLET | ORAL | Status: DC | PRN

## 2015-02-17 MED ORDER — IOHEXOL 350 MG/ML SOLN
100.0000 mL | Freq: Once | INTRAVENOUS | Status: AC | PRN
  Administered 2015-02-17: 100 mL via INTRAVENOUS

## 2015-02-17 MED ORDER — METHYLPREDNISOLONE SODIUM SUCC 125 MG IJ SOLR
125.0000 mg | Freq: Once | INTRAMUSCULAR | Status: AC
  Administered 2015-02-17: 125 mg via INTRAVENOUS
  Filled 2015-02-17: qty 2

## 2015-02-17 NOTE — ED Provider Notes (Signed)
CSN: [id number]     Arrival date & time 02/17/15  0305 History   First MD Initiated Contact with Patient 02/17/15 [phone]     Chief Complaint  Patient presents with  . Chest Pain     (Consider location/radiation/quality/duration/timing/severity/associated sxs/prior Treatment) Patient is a 34 y.o. female presenting with chest pain. The history is provided by the patient.  Chest Pain She complains of sharp, heavy chest pain which is bilateral and anterior. Pain is worse with deep breathing and also worse if she lays on her right side. She rates pain at 9/10. There is associated dyspnea but no nausea or diaphoresis. She denies any cough. Pain is similar to chest wall pain she has had in the past. She has tried taking Goody powder without any relief. She also tried taking a muscle relaxer without relief. Of note, she is on warfarin for history of pulmonary embolism and presumed lupus anticoagulant and is on lifelong anticoagulation. She denies fever, chills, sweats.  Past Medical History  Diagnosis Date  . Hypertension   . Arthritis   . Lupus   . Fibromyalgia   . Anemia   . PE (pulmonary embolism) 2009   Past Surgical History  Procedure Laterality Date  . Cholecystectomy    . Tubal ligation    . Dilation and curettage of uterus    . Ablation     Family History  Problem Relation Age of Onset  . Stroke Mother   . Pseudochol deficiency Neg Hx   . Malignant hyperthermia Neg Hx   . Hypotension Neg Hx   . Anesthesia problems Neg Hx   . Cancer Other   . Diabetes Other    Social History  Substance Use Topics  . Smoking status: Never Smoker   . Smokeless tobacco: Never Used  . Alcohol Use: No   OB History    Gravida Para Term Preterm AB TAB SAB Ectopic Multiple Living   4 2  2 2     2      Review of Systems  Cardiovascular: Positive for chest pain.  All other systems reviewed and are negative.     Allergies  Codeine; Compazine; Ivp dye; Nitrofurantoin monohyd macro;  Penicillins; and Sulfasalazine  Home Medications   Prior to Admission medications   Medication Sig Start Date End Date Taking? Authorizing Provider  DULoxetine (CYMBALTA) 30 MG capsule Take 60 mg by mouth at bedtime.     Historical Provider, MD  fluticasone (FLONASE) 50 MCG/ACT nasal spray Place 2 sprays into both nostrils daily as needed for allergies or rhinitis.    Historical Provider, MD  hydroxychloroquine (PLAQUENIL) 200 MG tablet Take 200 mg by mouth 2 (two) times daily.    Historical Provider, MD  ibuprofen (ADVIL,MOTRIN) 600 MG tablet Take 1 tablet (600 mg total) by mouth every 6 (six) hours as needed. 08/20/14   08/22/14, MD  lisinopril-hydrochlorothiazide (PRINZIDE,ZESTORETIC) 10-12.5 MG per tablet Take 1 tablet by mouth daily.    Historical Provider, MD  methocarbamol (ROBAXIN) 500 MG tablet Take 2 tablets (1,000 mg total) by mouth every 8 (eight) hours as needed. 08/20/14   08/22/14, MD  metoCLOPramide (REGLAN) 10 MG tablet Take 1 tablet (10 mg total) by mouth every 6 (six) hours. 11/03/13   01/03/14, MD  nortriptyline (PAMELOR) 75 MG capsule Take 75 mg by mouth at bedtime.     Historical Provider, MD  oxyCODONE-acetaminophen (PERCOCET) 5-325 MG per tablet Take 2 tablets by mouth every 4 (four) hours as needed.  08/20/14   Loren Racer, MD  ranitidine (ZANTAC) 150 MG tablet Take 150 mg by mouth 2 (two) times daily.    Historical Provider, MD  warfarin (COUMADIN) 7.5 MG tablet Take 5-7.5 mg by mouth daily. Saturday and Sunday take 5mg . All other days take 7.5mg     Historical Provider, MD  ziprasidone (GEODON) 80 MG capsule Take 80 mg by mouth at bedtime.    Historical Provider, MD  zolpidem (AMBIEN) 10 MG tablet Take 10 mg by mouth at bedtime as needed. For sleep    Historical Provider, MD   BP 95/56 mmHg  Pulse 71  Temp(Src) 98.6 F (37 C) (Oral)  Resp 18  Ht 5\' 7"  (1.702 m)  Wt 223 lb (101.152 kg)  BMI 34.92 kg/m2  SpO2 98% Physical Exam  Nursing note and  vitals reviewed.  34 year old female, resting comfortably and in no acute distress. Vital signs are normal. Oxygen saturation is 98%, which is normal. Head is normocephalic and atraumatic. PERRLA, EOMI. Oropharynx is clear. Neck is nontender and supple without adenopathy or JVD. Back is nontender and there is no CVA tenderness. Lungs are clear without rales, wheezes, or rhonchi. Chest is moderately tender in the parasternal area bilaterally which reproduces her pain. Heart has regular rate and rhythm without murmur. Abdomen is soft, flat, nontender without masses or hepatosplenomegaly and peristalsis is normoactive. Extremities have no cyanosis or edema, full range of motion is present. Skin is warm and dry without rash. Neurologic: Mental status is normal, cranial nerves are intact, there are no motor or sensory deficits.  ED Course  Procedures (including critical care time) Labs Review Results for orders placed or performed during the hospital encounter of 02/17/15  Basic metabolic panel  Result Value Ref Range   Sodium 135 135 - 145 mmol/L   Potassium 4.0 3.5 - 5.1 mmol/L   Chloride 108 101 - 111 mmol/L   CO2 20 (L) 22 - 32 mmol/L   Glucose, Bld 145 (H) 65 - 99 mg/dL   BUN 12 6 - 20 mg/dL   Creatinine, Ser 3.34 (H) 0.44 - 1.00 mg/dL   Calcium 8.8 (L) 8.9 - 10.3 mg/dL   GFR calc non Af Amer >60 >60 mL/min   GFR calc Af Amer >60 >60 mL/min   Anion gap 7 5 - 15  CBC  Result Value Ref Range   WBC 6.1 4.0 - 10.5 K/uL   RBC 4.11 3.87 - 5.11 MIL/uL   Hemoglobin 11.0 (L) 12.0 - 15.0 g/dL   HCT 35.6 (L) 86.1 - 68.3 %   MCV 84.2 78.0 - 100.0 fL   MCH 26.8 26.0 - 34.0 pg   MCHC 31.8 30.0 - 36.0 g/dL   RDW 72.9 02.1 - 11.5 %   Platelets 235 150 - 400 K/uL  Troponin I  Result Value Ref Range   Troponin I <0.03 <0.031 ng/mL  Protime-INR - (order if Patient is taking Coumadin / Warfarin)  Result Value Ref Range   Prothrombin Time 17.3 (H) 11.6 - 15.2 seconds   INR 1.40 0.00 - 1.49   Differential  Result Value Ref Range   Neutrophils Relative % 66 %   Neutro Abs 4.0 1.7 - 7.7 K/uL   Lymphocytes Relative 27 %   Lymphs Abs 1.6 0.7 - 4.0 K/uL   Monocytes Relative 5 %   Monocytes Absolute 0.3 0.1 - 1.0 K/uL   Eosinophils Relative 2 %   Eosinophils Absolute 0.1 0.0 - 0.7 K/uL  Basophils Relative 0 %   Basophils Absolute 0.0 0.0 - 0.1 K/uL  I-Stat beta hCG blood, ED  Result Value Ref Range   I-stat hCG, quantitative <5.0 <5 mIU/mL   Comment 3           Imaging Review Dg Chest 2 View  02/17/2015  CLINICAL DATA:  Central chest pain waking patient from sleep. Shortness of breath. EXAM: CHEST  2 VIEW COMPARISON:  Chest radiograph December 02, 2014 FINDINGS: Cardiomediastinal silhouette is unremarkable for this low inspiratory examination with crowded vasculature markings. The lungs are clear without pleural effusions or focal consolidations. Trachea projects midline and there is no pneumothorax. Included soft tissue planes and osseous structures are non-suspicious. Surgical clips in the included right abdomen compatible with cholecystectomy. IMPRESSION: No acute cardiopulmonary process for this low inspiratory examination. Electronically Signed   By: Awilda Metro M.D.   On: 02/17/2015 04:38   I have personally reviewed and evaluated these images and lab results as part of my medical decision-making.   EKG Interpretation   Date/Time:  Wednesday February 17 2015 03:18:34 EST Ventricular Rate:  69 PR Interval:  159 QRS Duration: 91 QT Interval:  449 QTC Calculation: 481 R Axis:   -3 Text Interpretation:  Sinus rhythm Borderline T abnormalities, diffuse  leads When compared with ECG of 11/02/2013, No significant change was found  Confirmed by [facility]  MD, Shmuel Girgis ([id number]) on 02/17/2015 3:48:05 AM      MDM   Final diagnoses:  Chest wall pain  Subtherapeutic international normalized ratio (INR)  Normochromic normocytic anemia    Chest pain in patient with history  of chest wall pain. Pain is reproducible and no other red flags to suggest more serious illness. ECG is unchanged from baseline. INR will be checked and if therapeutic, no need to pursue possibility of recurrent pulmonary embolism. Old records are reviewed and she has several visits for chest wall pain.  INR has come back subtherapeutic. She is sent for CT angiogram which shows no evidence of pulmonary embolism. She is discharged with prescription for oxycodone have acetaminophen for pain control.  Dione Booze, MD 02/17/15 [phone]

## 2015-02-17 NOTE — Discharge Instructions (Signed)
Contact a physician who is monitoring your warfarin therapy. Your INR today was 1.4 which is too low. Ask the physician how to adjust your dose.  Chest Wall Pain Chest wall pain is pain in or around the bones and muscles of your chest. Sometimes, an injury causes this pain. Sometimes, the cause may not be known. This pain may take several weeks or longer to get better. HOME CARE INSTRUCTIONS  Pay attention to any changes in your symptoms. Take these actions to help with your pain:   Rest as told by your health care provider.   Avoid activities that cause pain. These include any activities that use your chest muscles or your abdominal and side muscles to lift heavy items.   If directed, apply ice to the painful area:  Put ice in a plastic bag.  Place a towel between your skin and the bag.  Leave the ice on for 20 minutes, 2-3 times per day.  Take over-the-counter and prescription medicines only as told by your health care provider.  Do not use tobacco products, including cigarettes, chewing tobacco, and e-cigarettes. If you need help quitting, ask your health care provider.  Keep all follow-up visits as told by your health care provider. This is important. SEEK MEDICAL CARE IF:  You have a fever.  Your chest pain becomes worse.  You have new symptoms. SEEK IMMEDIATE MEDICAL CARE IF:  You have nausea or vomiting.  You feel sweaty or light-headed.  You have a cough with phlegm (sputum) or you cough up blood.  You develop shortness of breath.   This information is not intended to replace advice given to you by your health care provider. Make sure you discuss any questions you have with your health care provider.   Document Released: 02/13/2005 Document Revised: 11/04/2014 Document Reviewed: 05/11/2014 Elsevier Interactive Patient Education 2016 [employer]  Acetaminophen; Oxycodone tablets What is this medicine? ACETAMINOPHEN; OXYCODONE (a set a MEE noe fen; ox i KOE  done) is a pain reliever. It is used to treat moderate to severe pain. This medicine may be used for other purposes; ask your health care provider or pharmacist if you have questions. What should I tell my health care provider before I take this medicine? They need to know if you have any of these conditions: -brain tumor -Crohn's disease, inflammatory bowel disease, or ulcerative colitis -drug abuse or addiction -head injury -heart or circulation problems -if you often drink alcohol -kidney disease or problems going to the bathroom -liver disease -lung disease, asthma, or breathing problems -an unusual or allergic reaction to acetaminophen, oxycodone, other opioid analgesics, other medicines, foods, dyes, or preservatives -pregnant or trying to get pregnant -breast-feeding How should I use this medicine? Take this medicine by mouth with a full glass of water. Follow the directions on the prescription label. You can take it with or without food. If it upsets your stomach, take it with food. Take your medicine at regular intervals. Do not take it more often than directed. Talk to your pediatrician regarding the use of this medicine in children. Special care may be needed. Patients over 73 years old may have a stronger reaction and need a smaller dose. Overdosage: If you think you have taken too much of this medicine contact a poison control center or emergency room at once. NOTE: This medicine is only for you. Do not share this medicine with others. What if I miss a dose? If you miss a dose, take it as soon  as you can. If it is almost time for your next dose, take only that dose. Do not take double or extra doses. What may interact with this medicine? -alcohol -antihistamines -barbiturates like amobarbital, butalbital, butabarbital, methohexital, pentobarbital, phenobarbital, thiopental, and secobarbital -benztropine -drugs for bladder problems like solifenacin, trospium, oxybutynin,  tolterodine, hyoscyamine, and methscopolamine -drugs for breathing problems like ipratropium and tiotropium -drugs for certain stomach or intestine problems like propantheline, homatropine methylbromide, glycopyrrolate, atropine, belladonna, and dicyclomine -general anesthetics like etomidate, ketamine, nitrous oxide, propofol, desflurane, enflurane, halothane, isoflurane, and sevoflurane -medicines for depression, anxiety, or psychotic disturbances -medicines for sleep -muscle relaxants -naltrexone -narcotic medicines (opiates) for pain -phenothiazines like perphenazine, thioridazine, chlorpromazine, mesoridazine, fluphenazine, prochlorperazine, promazine, and trifluoperazine -scopolamine -tramadol -trihexyphenidyl This list may not describe all possible interactions. Give your health care provider a list of all the medicines, herbs, non-prescription drugs, or dietary supplements you use. Also tell them if you smoke, drink alcohol, or use illegal drugs. Some items may interact with your medicine. What should I watch for while using this medicine? Tell your doctor or health care professional if your pain does not go away, if it gets worse, or if you have new or a different type of pain. You may develop tolerance to the medicine. Tolerance means that you will need a higher dose of the medication for pain relief. Tolerance is normal and is expected if you take this medicine for a long time. Do not suddenly stop taking your medicine because you may develop a severe reaction. Your body becomes used to the medicine. This does NOT mean you are addicted. Addiction is a behavior related to getting and using a drug for a non-medical reason. If you have pain, you have a medical reason to take pain medicine. Your doctor will tell you how much medicine to take. If your doctor wants you to stop the medicine, the dose will be slowly lowered over time to avoid any side effects. You may get drowsy or dizzy. Do not  drive, use machinery, or do anything that needs mental alertness until you know how this medicine affects you. Do not stand or sit up quickly, especially if you are an older patient. This reduces the risk of dizzy or fainting spells. Alcohol may interfere with the effect of this medicine. Avoid alcoholic drinks. There are different types of narcotic medicines (opiates) for pain. If you take more than one type at the same time, you may have more side effects. Give your health care provider a list of all medicines you use. Your doctor will tell you how much medicine to take. Do not take more medicine than directed. Call emergency for help if you have problems breathing. The medicine will cause constipation. Try to have a bowel movement at least every 2 to 3 days. If you do not have a bowel movement for 3 days, call your doctor or health care professional. Do not take Tylenol (acetaminophen) or medicines that have acetaminophen with this medicine. Too much acetaminophen can be very dangerous. Many nonprescription medicines contain acetaminophen. Always read the labels carefully to avoid taking more acetaminophen. What side effects may I notice from receiving this medicine? Side effects that you should report to your doctor or health care professional as soon as possible: -allergic reactions like skin rash, itching or hives, swelling of the face, lips, or tongue -breathing difficulties, wheezing -confusion -light headedness or fainting spells -severe stomach pain -unusually weak or tired -yellowing of the skin or the whites of the  eyes Side effects that usually do not require medical attention (report to your doctor or health care professional if they continue or are bothersome): -dizziness -drowsiness -nausea -vomiting This list may not describe all possible side effects. Call your doctor for medical advice about side effects. You may report side effects to FDA at 1-800-FDA-1088. Where should I keep  my medicine? Keep out of the reach of children. This medicine can be abused. Keep your medicine in a safe place to protect it from theft. Do not share this medicine with anyone. Selling or giving away this medicine is dangerous and against the law. This medicine may cause accidental overdose and death if it taken by other adults, children, or pets. Mix any unused medicine with a substance like cat litter or coffee grounds. Then throw the medicine away in a sealed container like a sealed bag or a coffee can with a lid. Do not use the medicine after the expiration date. Store at room temperature between 20 and 25 degrees C (68 and 77 degrees F). NOTE: This sheet is a summary. It may not cover all possible information. If you have questions about this medicine, talk to your doctor, pharmacist, or health care provider.    2016, Elsevier/Gold Standard. (2014-01-14 15:18:46)

## 2015-02-17 NOTE — ED Notes (Signed)
Pt was woke out of sleep with central chest pain, shortness of breath, couldn't get comfortable.

## 2015-03-09 DIAGNOSIS — M3214 Glomerular disease in systemic lupus erythematosus: Secondary | ICD-10-CM | POA: Diagnosis not present

## 2015-03-09 DIAGNOSIS — E119 Type 2 diabetes mellitus without complications: Secondary | ICD-10-CM | POA: Diagnosis not present

## 2015-03-09 DIAGNOSIS — K219 Gastro-esophageal reflux disease without esophagitis: Secondary | ICD-10-CM | POA: Diagnosis not present

## 2015-03-09 DIAGNOSIS — R0789 Other chest pain: Secondary | ICD-10-CM | POA: Diagnosis not present

## 2015-03-09 DIAGNOSIS — Z79899 Other long term (current) drug therapy: Secondary | ICD-10-CM | POA: Diagnosis not present

## 2015-03-09 DIAGNOSIS — M2550 Pain in unspecified joint: Secondary | ICD-10-CM | POA: Diagnosis not present

## 2015-03-09 DIAGNOSIS — F329 Major depressive disorder, single episode, unspecified: Secondary | ICD-10-CM | POA: Diagnosis not present

## 2015-03-09 DIAGNOSIS — M069 Rheumatoid arthritis, unspecified: Secondary | ICD-10-CM | POA: Diagnosis not present

## 2015-03-09 DIAGNOSIS — M797 Fibromyalgia: Secondary | ICD-10-CM | POA: Diagnosis not present

## 2015-03-24 DIAGNOSIS — Z5181 Encounter for therapeutic drug level monitoring: Secondary | ICD-10-CM | POA: Diagnosis not present

## 2015-03-24 DIAGNOSIS — Z79899 Other long term (current) drug therapy: Secondary | ICD-10-CM | POA: Diagnosis not present

## 2015-03-29 DIAGNOSIS — R0602 Shortness of breath: Secondary | ICD-10-CM | POA: Diagnosis not present

## 2015-03-29 DIAGNOSIS — R079 Chest pain, unspecified: Secondary | ICD-10-CM | POA: Diagnosis not present

## 2015-03-29 DIAGNOSIS — M329 Systemic lupus erythematosus, unspecified: Secondary | ICD-10-CM | POA: Diagnosis not present

## 2015-03-29 DIAGNOSIS — R0789 Other chest pain: Secondary | ICD-10-CM | POA: Diagnosis not present

## 2015-03-29 DIAGNOSIS — E669 Obesity, unspecified: Secondary | ICD-10-CM | POA: Diagnosis not present

## 2015-03-30 DIAGNOSIS — R079 Chest pain, unspecified: Secondary | ICD-10-CM | POA: Diagnosis not present

## 2015-03-30 DIAGNOSIS — M329 Systemic lupus erythematosus, unspecified: Secondary | ICD-10-CM | POA: Diagnosis not present

## 2015-04-07 DIAGNOSIS — E119 Type 2 diabetes mellitus without complications: Secondary | ICD-10-CM | POA: Diagnosis not present

## 2015-04-07 DIAGNOSIS — M069 Rheumatoid arthritis, unspecified: Secondary | ICD-10-CM | POA: Diagnosis not present

## 2015-04-07 DIAGNOSIS — Z79899 Other long term (current) drug therapy: Secondary | ICD-10-CM | POA: Diagnosis not present

## 2015-04-07 DIAGNOSIS — R0789 Other chest pain: Secondary | ICD-10-CM | POA: Diagnosis not present

## 2015-04-07 DIAGNOSIS — F329 Major depressive disorder, single episode, unspecified: Secondary | ICD-10-CM | POA: Diagnosis not present

## 2015-04-07 DIAGNOSIS — M2550 Pain in unspecified joint: Secondary | ICD-10-CM | POA: Diagnosis not present

## 2015-04-07 DIAGNOSIS — M797 Fibromyalgia: Secondary | ICD-10-CM | POA: Diagnosis not present

## 2015-04-07 DIAGNOSIS — M3214 Glomerular disease in systemic lupus erythematosus: Secondary | ICD-10-CM | POA: Diagnosis not present

## 2015-04-07 DIAGNOSIS — K219 Gastro-esophageal reflux disease without esophagitis: Secondary | ICD-10-CM | POA: Diagnosis not present

## 2015-04-09 DIAGNOSIS — E118 Type 2 diabetes mellitus with unspecified complications: Secondary | ICD-10-CM | POA: Diagnosis not present

## 2015-04-13 DIAGNOSIS — Z79899 Other long term (current) drug therapy: Secondary | ICD-10-CM | POA: Diagnosis not present

## 2015-04-13 DIAGNOSIS — Z5181 Encounter for therapeutic drug level monitoring: Secondary | ICD-10-CM | POA: Diagnosis not present

## 2015-04-16 DIAGNOSIS — M0579 Rheumatoid arthritis with rheumatoid factor of multiple sites without organ or systems involvement: Secondary | ICD-10-CM | POA: Diagnosis not present

## 2015-04-16 DIAGNOSIS — M79671 Pain in right foot: Secondary | ICD-10-CM | POA: Diagnosis not present

## 2015-04-16 DIAGNOSIS — M79642 Pain in left hand: Secondary | ICD-10-CM | POA: Diagnosis not present

## 2015-04-16 DIAGNOSIS — M79641 Pain in right hand: Secondary | ICD-10-CM | POA: Diagnosis not present

## 2015-04-16 DIAGNOSIS — M79672 Pain in left foot: Secondary | ICD-10-CM | POA: Diagnosis not present

## 2015-04-16 DIAGNOSIS — M797 Fibromyalgia: Secondary | ICD-10-CM | POA: Diagnosis not present

## 2015-04-16 DIAGNOSIS — M25561 Pain in right knee: Secondary | ICD-10-CM | POA: Diagnosis not present

## 2015-04-16 DIAGNOSIS — Z09 Encounter for follow-up examination after completed treatment for conditions other than malignant neoplasm: Secondary | ICD-10-CM | POA: Diagnosis not present

## 2015-04-19 DIAGNOSIS — E669 Obesity, unspecified: Secondary | ICD-10-CM | POA: Diagnosis not present

## 2015-04-19 DIAGNOSIS — G43909 Migraine, unspecified, not intractable, without status migrainosus: Secondary | ICD-10-CM | POA: Diagnosis not present

## 2015-04-19 DIAGNOSIS — Z79899 Other long term (current) drug therapy: Secondary | ICD-10-CM | POA: Diagnosis not present

## 2015-04-19 DIAGNOSIS — E119 Type 2 diabetes mellitus without complications: Secondary | ICD-10-CM | POA: Diagnosis not present

## 2015-04-19 DIAGNOSIS — Z86711 Personal history of pulmonary embolism: Secondary | ICD-10-CM | POA: Diagnosis not present

## 2015-04-19 DIAGNOSIS — M329 Systemic lupus erythematosus, unspecified: Secondary | ICD-10-CM | POA: Diagnosis not present

## 2015-04-19 DIAGNOSIS — M810 Age-related osteoporosis without current pathological fracture: Secondary | ICD-10-CM | POA: Diagnosis not present

## 2015-04-19 DIAGNOSIS — F329 Major depressive disorder, single episode, unspecified: Secondary | ICD-10-CM | POA: Diagnosis not present

## 2015-04-19 DIAGNOSIS — M069 Rheumatoid arthritis, unspecified: Secondary | ICD-10-CM | POA: Diagnosis not present

## 2015-04-19 DIAGNOSIS — Z7901 Long term (current) use of anticoagulants: Secondary | ICD-10-CM | POA: Diagnosis not present

## 2015-04-19 DIAGNOSIS — I10 Essential (primary) hypertension: Secondary | ICD-10-CM | POA: Diagnosis not present

## 2015-04-19 DIAGNOSIS — M797 Fibromyalgia: Secondary | ICD-10-CM | POA: Diagnosis not present

## 2015-04-21 DIAGNOSIS — M069 Rheumatoid arthritis, unspecified: Secondary | ICD-10-CM | POA: Diagnosis not present

## 2015-04-21 DIAGNOSIS — Z79899 Other long term (current) drug therapy: Secondary | ICD-10-CM | POA: Diagnosis not present

## 2015-04-21 DIAGNOSIS — M3210 Systemic lupus erythematosus, organ or system involvement unspecified: Secondary | ICD-10-CM | POA: Diagnosis not present

## 2015-04-23 DIAGNOSIS — Z5181 Encounter for therapeutic drug level monitoring: Secondary | ICD-10-CM | POA: Diagnosis not present

## 2015-04-23 DIAGNOSIS — M2550 Pain in unspecified joint: Secondary | ICD-10-CM | POA: Diagnosis not present

## 2015-04-23 DIAGNOSIS — R5381 Other malaise: Secondary | ICD-10-CM | POA: Diagnosis not present

## 2015-04-23 DIAGNOSIS — Z79899 Other long term (current) drug therapy: Secondary | ICD-10-CM | POA: Diagnosis not present

## 2015-04-23 DIAGNOSIS — Z113 Encounter for screening for infections with a predominantly sexual mode of transmission: Secondary | ICD-10-CM | POA: Diagnosis not present

## 2015-05-05 DIAGNOSIS — M2550 Pain in unspecified joint: Secondary | ICD-10-CM | POA: Diagnosis not present

## 2015-05-05 DIAGNOSIS — M797 Fibromyalgia: Secondary | ICD-10-CM | POA: Diagnosis not present

## 2015-05-05 DIAGNOSIS — R0789 Other chest pain: Secondary | ICD-10-CM | POA: Diagnosis not present

## 2015-05-05 DIAGNOSIS — M3214 Glomerular disease in systemic lupus erythematosus: Secondary | ICD-10-CM | POA: Diagnosis not present

## 2015-05-05 DIAGNOSIS — M069 Rheumatoid arthritis, unspecified: Secondary | ICD-10-CM | POA: Diagnosis not present

## 2015-05-05 DIAGNOSIS — K219 Gastro-esophageal reflux disease without esophagitis: Secondary | ICD-10-CM | POA: Diagnosis not present

## 2015-05-05 DIAGNOSIS — Z79899 Other long term (current) drug therapy: Secondary | ICD-10-CM | POA: Diagnosis not present

## 2015-05-05 DIAGNOSIS — F329 Major depressive disorder, single episode, unspecified: Secondary | ICD-10-CM | POA: Diagnosis not present

## 2015-05-05 DIAGNOSIS — E119 Type 2 diabetes mellitus without complications: Secondary | ICD-10-CM | POA: Diagnosis not present

## 2015-05-09 DIAGNOSIS — R079 Chest pain, unspecified: Secondary | ICD-10-CM | POA: Diagnosis not present

## 2015-05-09 DIAGNOSIS — M79602 Pain in left arm: Secondary | ICD-10-CM | POA: Diagnosis not present

## 2015-05-09 DIAGNOSIS — I10 Essential (primary) hypertension: Secondary | ICD-10-CM | POA: Diagnosis not present

## 2015-05-09 DIAGNOSIS — R0789 Other chest pain: Secondary | ICD-10-CM | POA: Diagnosis not present

## 2015-05-09 DIAGNOSIS — Z79899 Other long term (current) drug therapy: Secondary | ICD-10-CM | POA: Diagnosis not present

## 2015-05-11 DIAGNOSIS — G43909 Migraine, unspecified, not intractable, without status migrainosus: Secondary | ICD-10-CM | POA: Diagnosis not present

## 2015-05-11 DIAGNOSIS — Z79899 Other long term (current) drug therapy: Secondary | ICD-10-CM | POA: Diagnosis not present

## 2015-05-11 DIAGNOSIS — R0789 Other chest pain: Secondary | ICD-10-CM | POA: Diagnosis not present

## 2015-05-11 DIAGNOSIS — M797 Fibromyalgia: Secondary | ICD-10-CM | POA: Diagnosis not present

## 2015-05-11 DIAGNOSIS — Z7901 Long term (current) use of anticoagulants: Secondary | ICD-10-CM | POA: Diagnosis not present

## 2015-05-19 DIAGNOSIS — M069 Rheumatoid arthritis, unspecified: Secondary | ICD-10-CM | POA: Diagnosis not present

## 2015-05-19 DIAGNOSIS — D509 Iron deficiency anemia, unspecified: Secondary | ICD-10-CM | POA: Diagnosis not present

## 2015-05-19 DIAGNOSIS — D638 Anemia in other chronic diseases classified elsewhere: Secondary | ICD-10-CM | POA: Diagnosis not present

## 2015-05-19 DIAGNOSIS — G43919 Migraine, unspecified, intractable, without status migrainosus: Secondary | ICD-10-CM | POA: Diagnosis not present

## 2015-05-19 DIAGNOSIS — M329 Systemic lupus erythematosus, unspecified: Secondary | ICD-10-CM | POA: Diagnosis not present

## 2015-05-20 DIAGNOSIS — R5383 Other fatigue: Secondary | ICD-10-CM | POA: Diagnosis not present

## 2015-05-20 DIAGNOSIS — Z8739 Personal history of other diseases of the musculoskeletal system and connective tissue: Secondary | ICD-10-CM | POA: Diagnosis not present

## 2015-05-20 DIAGNOSIS — Z86711 Personal history of pulmonary embolism: Secondary | ICD-10-CM | POA: Diagnosis not present

## 2015-05-20 DIAGNOSIS — G8929 Other chronic pain: Secondary | ICD-10-CM | POA: Diagnosis not present

## 2015-05-20 DIAGNOSIS — R000 Tachycardia, unspecified: Secondary | ICD-10-CM | POA: Diagnosis not present

## 2015-05-20 DIAGNOSIS — D899 Disorder involving the immune mechanism, unspecified: Secondary | ICD-10-CM | POA: Diagnosis not present

## 2015-05-20 DIAGNOSIS — Z7901 Long term (current) use of anticoagulants: Secondary | ICD-10-CM | POA: Diagnosis not present

## 2015-05-20 DIAGNOSIS — R0789 Other chest pain: Secondary | ICD-10-CM | POA: Diagnosis not present

## 2015-05-20 DIAGNOSIS — R51 Headache: Secondary | ICD-10-CM | POA: Diagnosis not present

## 2015-05-25 ENCOUNTER — Telehealth: Payer: Self-pay | Admitting: Hematology

## 2015-05-25 NOTE — Telephone Encounter (Signed)
Pt lives in [place]. Faxed pt medical records to [facility] to schedule  np appt. Called pt's cell phone would not except vm. Called pt's home phone vm was full.

## 2015-05-26 DIAGNOSIS — M0579 Rheumatoid arthritis with rheumatoid factor of multiple sites without organ or systems involvement: Secondary | ICD-10-CM | POA: Diagnosis not present

## 2015-05-26 DIAGNOSIS — M359 Systemic involvement of connective tissue, unspecified: Secondary | ICD-10-CM | POA: Diagnosis not present

## 2015-05-26 DIAGNOSIS — D890 Polyclonal hypergammaglobulinemia: Secondary | ICD-10-CM

## 2015-05-26 DIAGNOSIS — Z86711 Personal history of pulmonary embolism: Secondary | ICD-10-CM | POA: Diagnosis not present

## 2015-05-26 DIAGNOSIS — Z803 Family history of malignant neoplasm of breast: Secondary | ICD-10-CM | POA: Diagnosis not present

## 2015-05-26 DIAGNOSIS — M329 Systemic lupus erythematosus, unspecified: Secondary | ICD-10-CM | POA: Diagnosis not present

## 2015-05-26 HISTORY — DX: Polyclonal hypergammaglobulinemia: D89.0

## 2015-06-01 DIAGNOSIS — M329 Systemic lupus erythematosus, unspecified: Secondary | ICD-10-CM | POA: Diagnosis not present

## 2015-06-01 DIAGNOSIS — M359 Systemic involvement of connective tissue, unspecified: Secondary | ICD-10-CM | POA: Diagnosis not present

## 2015-06-03 DIAGNOSIS — M797 Fibromyalgia: Secondary | ICD-10-CM | POA: Diagnosis not present

## 2015-06-03 DIAGNOSIS — K219 Gastro-esophageal reflux disease without esophagitis: Secondary | ICD-10-CM | POA: Diagnosis not present

## 2015-06-03 DIAGNOSIS — M2550 Pain in unspecified joint: Secondary | ICD-10-CM | POA: Diagnosis not present

## 2015-06-03 DIAGNOSIS — M3214 Glomerular disease in systemic lupus erythematosus: Secondary | ICD-10-CM | POA: Diagnosis not present

## 2015-06-03 DIAGNOSIS — R0789 Other chest pain: Secondary | ICD-10-CM | POA: Diagnosis not present

## 2015-06-03 DIAGNOSIS — E119 Type 2 diabetes mellitus without complications: Secondary | ICD-10-CM | POA: Diagnosis not present

## 2015-06-03 DIAGNOSIS — M069 Rheumatoid arthritis, unspecified: Secondary | ICD-10-CM | POA: Diagnosis not present

## 2015-06-03 DIAGNOSIS — F329 Major depressive disorder, single episode, unspecified: Secondary | ICD-10-CM | POA: Diagnosis not present

## 2015-06-03 DIAGNOSIS — Z79899 Other long term (current) drug therapy: Secondary | ICD-10-CM | POA: Diagnosis not present

## 2015-06-16 DIAGNOSIS — M329 Systemic lupus erythematosus, unspecified: Secondary | ICD-10-CM | POA: Diagnosis not present

## 2015-06-16 DIAGNOSIS — D890 Polyclonal hypergammaglobulinemia: Secondary | ICD-10-CM | POA: Diagnosis not present

## 2015-06-16 DIAGNOSIS — M0579 Rheumatoid arthritis with rheumatoid factor of multiple sites without organ or systems involvement: Secondary | ICD-10-CM | POA: Diagnosis not present

## 2015-06-16 DIAGNOSIS — Z803 Family history of malignant neoplasm of breast: Secondary | ICD-10-CM | POA: Diagnosis not present

## 2015-06-16 DIAGNOSIS — Z86711 Personal history of pulmonary embolism: Secondary | ICD-10-CM | POA: Diagnosis not present

## 2015-06-19 DIAGNOSIS — I10 Essential (primary) hypertension: Secondary | ICD-10-CM | POA: Diagnosis not present

## 2015-06-19 DIAGNOSIS — E119 Type 2 diabetes mellitus without complications: Secondary | ICD-10-CM | POA: Diagnosis not present

## 2015-06-19 DIAGNOSIS — M069 Rheumatoid arthritis, unspecified: Secondary | ICD-10-CM | POA: Diagnosis not present

## 2015-06-19 DIAGNOSIS — R51 Headache: Secondary | ICD-10-CM | POA: Diagnosis not present

## 2015-06-19 DIAGNOSIS — M329 Systemic lupus erythematosus, unspecified: Secondary | ICD-10-CM | POA: Diagnosis not present

## 2015-06-19 DIAGNOSIS — G8929 Other chronic pain: Secondary | ICD-10-CM | POA: Diagnosis not present

## 2015-06-19 DIAGNOSIS — Z7901 Long term (current) use of anticoagulants: Secondary | ICD-10-CM | POA: Diagnosis not present

## 2015-06-19 DIAGNOSIS — Z79899 Other long term (current) drug therapy: Secondary | ICD-10-CM | POA: Diagnosis not present

## 2015-06-19 DIAGNOSIS — R0789 Other chest pain: Secondary | ICD-10-CM | POA: Diagnosis not present

## 2015-06-19 DIAGNOSIS — M797 Fibromyalgia: Secondary | ICD-10-CM | POA: Diagnosis not present

## 2015-06-22 DIAGNOSIS — M797 Fibromyalgia: Secondary | ICD-10-CM | POA: Diagnosis not present

## 2015-06-22 DIAGNOSIS — G43909 Migraine, unspecified, not intractable, without status migrainosus: Secondary | ICD-10-CM | POA: Diagnosis not present

## 2015-06-22 DIAGNOSIS — M069 Rheumatoid arthritis, unspecified: Secondary | ICD-10-CM | POA: Diagnosis not present

## 2015-06-22 DIAGNOSIS — J309 Allergic rhinitis, unspecified: Secondary | ICD-10-CM | POA: Diagnosis not present

## 2015-06-22 DIAGNOSIS — M328 Other forms of systemic lupus erythematosus: Secondary | ICD-10-CM | POA: Diagnosis not present

## 2015-06-22 DIAGNOSIS — Z79899 Other long term (current) drug therapy: Secondary | ICD-10-CM | POA: Diagnosis not present

## 2015-06-22 DIAGNOSIS — M329 Systemic lupus erythematosus, unspecified: Secondary | ICD-10-CM | POA: Diagnosis not present

## 2015-06-22 DIAGNOSIS — G894 Chronic pain syndrome: Secondary | ICD-10-CM | POA: Diagnosis not present

## 2015-06-22 DIAGNOSIS — M791 Myalgia: Secondary | ICD-10-CM | POA: Diagnosis not present

## 2015-06-22 DIAGNOSIS — R51 Headache: Secondary | ICD-10-CM | POA: Diagnosis not present

## 2015-06-22 DIAGNOSIS — I10 Essential (primary) hypertension: Secondary | ICD-10-CM | POA: Diagnosis not present

## 2015-06-22 DIAGNOSIS — Z7901 Long term (current) use of anticoagulants: Secondary | ICD-10-CM | POA: Diagnosis not present

## 2015-07-09 DIAGNOSIS — N3000 Acute cystitis without hematuria: Secondary | ICD-10-CM | POA: Diagnosis not present

## 2015-07-09 DIAGNOSIS — G43019 Migraine without aura, intractable, without status migrainosus: Secondary | ICD-10-CM | POA: Diagnosis not present

## 2015-07-09 DIAGNOSIS — E669 Obesity, unspecified: Secondary | ICD-10-CM | POA: Diagnosis not present

## 2015-07-09 DIAGNOSIS — Z7901 Long term (current) use of anticoagulants: Secondary | ICD-10-CM | POA: Diagnosis not present

## 2015-07-09 DIAGNOSIS — M069 Rheumatoid arthritis, unspecified: Secondary | ICD-10-CM | POA: Diagnosis not present

## 2015-07-09 DIAGNOSIS — M797 Fibromyalgia: Secondary | ICD-10-CM | POA: Diagnosis not present

## 2015-07-09 DIAGNOSIS — M329 Systemic lupus erythematosus, unspecified: Secondary | ICD-10-CM | POA: Diagnosis not present

## 2015-07-09 DIAGNOSIS — Z79899 Other long term (current) drug therapy: Secondary | ICD-10-CM | POA: Diagnosis not present

## 2015-07-09 DIAGNOSIS — I10 Essential (primary) hypertension: Secondary | ICD-10-CM | POA: Diagnosis not present

## 2015-07-21 DIAGNOSIS — M797 Fibromyalgia: Secondary | ICD-10-CM | POA: Diagnosis not present

## 2015-07-21 DIAGNOSIS — M25511 Pain in right shoulder: Secondary | ICD-10-CM | POA: Diagnosis not present

## 2015-07-21 DIAGNOSIS — M2550 Pain in unspecified joint: Secondary | ICD-10-CM | POA: Diagnosis not present

## 2015-07-21 DIAGNOSIS — M069 Rheumatoid arthritis, unspecified: Secondary | ICD-10-CM | POA: Diagnosis not present

## 2015-07-21 DIAGNOSIS — Z8249 Family history of ischemic heart disease and other diseases of the circulatory system: Secondary | ICD-10-CM | POA: Diagnosis not present

## 2015-07-21 DIAGNOSIS — E119 Type 2 diabetes mellitus without complications: Secondary | ICD-10-CM | POA: Diagnosis not present

## 2015-07-21 DIAGNOSIS — G8929 Other chronic pain: Secondary | ICD-10-CM | POA: Diagnosis not present

## 2015-07-21 DIAGNOSIS — Z86711 Personal history of pulmonary embolism: Secondary | ICD-10-CM | POA: Diagnosis not present

## 2015-07-21 DIAGNOSIS — Z7901 Long term (current) use of anticoagulants: Secondary | ICD-10-CM | POA: Diagnosis not present

## 2015-07-21 DIAGNOSIS — M545 Low back pain: Secondary | ICD-10-CM | POA: Diagnosis not present

## 2015-07-21 DIAGNOSIS — I10 Essential (primary) hypertension: Secondary | ICD-10-CM | POA: Diagnosis not present

## 2015-07-21 DIAGNOSIS — M542 Cervicalgia: Secondary | ICD-10-CM | POA: Diagnosis not present

## 2015-07-21 DIAGNOSIS — Z79899 Other long term (current) drug therapy: Secondary | ICD-10-CM | POA: Diagnosis not present

## 2015-07-22 DIAGNOSIS — M545 Low back pain: Secondary | ICD-10-CM | POA: Diagnosis not present

## 2015-07-22 DIAGNOSIS — G8929 Other chronic pain: Secondary | ICD-10-CM | POA: Diagnosis not present

## 2015-08-19 DIAGNOSIS — Z79899 Other long term (current) drug therapy: Secondary | ICD-10-CM | POA: Diagnosis not present

## 2015-08-19 DIAGNOSIS — E860 Dehydration: Secondary | ICD-10-CM | POA: Diagnosis not present

## 2015-08-19 DIAGNOSIS — M069 Rheumatoid arthritis, unspecified: Secondary | ICD-10-CM | POA: Diagnosis not present

## 2015-08-19 DIAGNOSIS — Z7901 Long term (current) use of anticoagulants: Secondary | ICD-10-CM | POA: Diagnosis not present

## 2015-08-19 DIAGNOSIS — Z8249 Family history of ischemic heart disease and other diseases of the circulatory system: Secondary | ICD-10-CM | POA: Diagnosis not present

## 2015-08-19 DIAGNOSIS — R1110 Vomiting, unspecified: Secondary | ICD-10-CM | POA: Diagnosis not present

## 2015-08-19 DIAGNOSIS — I10 Essential (primary) hypertension: Secondary | ICD-10-CM | POA: Diagnosis not present

## 2015-08-19 DIAGNOSIS — E119 Type 2 diabetes mellitus without complications: Secondary | ICD-10-CM | POA: Diagnosis not present

## 2015-08-19 DIAGNOSIS — R1084 Generalized abdominal pain: Secondary | ICD-10-CM | POA: Diagnosis not present

## 2015-08-19 DIAGNOSIS — K529 Noninfective gastroenteritis and colitis, unspecified: Secondary | ICD-10-CM | POA: Diagnosis not present

## 2015-08-19 DIAGNOSIS — Z86711 Personal history of pulmonary embolism: Secondary | ICD-10-CM | POA: Diagnosis not present

## 2015-09-06 DIAGNOSIS — Z124 Encounter for screening for malignant neoplasm of cervix: Secondary | ICD-10-CM | POA: Diagnosis not present

## 2015-09-06 DIAGNOSIS — F5101 Primary insomnia: Secondary | ICD-10-CM | POA: Diagnosis not present

## 2015-09-06 DIAGNOSIS — I10 Essential (primary) hypertension: Secondary | ICD-10-CM | POA: Diagnosis not present

## 2015-09-06 DIAGNOSIS — Z6841 Body Mass Index (BMI) 40.0 and over, adult: Secondary | ICD-10-CM | POA: Diagnosis not present

## 2015-09-06 DIAGNOSIS — E7801 Familial hypercholesterolemia: Secondary | ICD-10-CM | POA: Diagnosis not present

## 2015-09-06 DIAGNOSIS — Z1231 Encounter for screening mammogram for malignant neoplasm of breast: Secondary | ICD-10-CM | POA: Diagnosis not present

## 2015-09-06 DIAGNOSIS — G894 Chronic pain syndrome: Secondary | ICD-10-CM | POA: Diagnosis not present

## 2015-09-06 DIAGNOSIS — Z1211 Encounter for screening for malignant neoplasm of colon: Secondary | ICD-10-CM | POA: Diagnosis not present

## 2015-09-06 DIAGNOSIS — E559 Vitamin D deficiency, unspecified: Secondary | ICD-10-CM | POA: Diagnosis not present

## 2015-09-06 DIAGNOSIS — E1165 Type 2 diabetes mellitus with hyperglycemia: Secondary | ICD-10-CM | POA: Diagnosis not present

## 2015-09-12 DIAGNOSIS — J40 Bronchitis, not specified as acute or chronic: Secondary | ICD-10-CM | POA: Diagnosis not present

## 2015-09-12 DIAGNOSIS — Z7901 Long term (current) use of anticoagulants: Secondary | ICD-10-CM | POA: Diagnosis not present

## 2015-09-12 DIAGNOSIS — I10 Essential (primary) hypertension: Secondary | ICD-10-CM | POA: Diagnosis not present

## 2015-09-12 DIAGNOSIS — Z79899 Other long term (current) drug therapy: Secondary | ICD-10-CM | POA: Diagnosis not present

## 2015-09-12 DIAGNOSIS — J029 Acute pharyngitis, unspecified: Secondary | ICD-10-CM | POA: Diagnosis not present

## 2015-09-12 DIAGNOSIS — M329 Systemic lupus erythematosus, unspecified: Secondary | ICD-10-CM | POA: Diagnosis not present

## 2015-09-12 DIAGNOSIS — R042 Hemoptysis: Secondary | ICD-10-CM | POA: Diagnosis not present

## 2015-09-12 DIAGNOSIS — R05 Cough: Secondary | ICD-10-CM | POA: Diagnosis not present

## 2015-10-12 DIAGNOSIS — Z6841 Body Mass Index (BMI) 40.0 and over, adult: Secondary | ICD-10-CM | POA: Diagnosis not present

## 2015-10-12 DIAGNOSIS — R109 Unspecified abdominal pain: Secondary | ICD-10-CM | POA: Diagnosis not present

## 2015-10-12 DIAGNOSIS — R1032 Left lower quadrant pain: Secondary | ICD-10-CM | POA: Diagnosis not present

## 2015-10-12 DIAGNOSIS — R1031 Right lower quadrant pain: Secondary | ICD-10-CM | POA: Diagnosis not present

## 2015-10-15 DIAGNOSIS — R1032 Left lower quadrant pain: Secondary | ICD-10-CM | POA: Diagnosis not present

## 2015-10-15 DIAGNOSIS — R1031 Right lower quadrant pain: Secondary | ICD-10-CM | POA: Diagnosis not present

## 2015-10-15 DIAGNOSIS — Z9889 Other specified postprocedural states: Secondary | ICD-10-CM | POA: Diagnosis not present

## 2015-10-27 DIAGNOSIS — R5383 Other fatigue: Secondary | ICD-10-CM | POA: Diagnosis not present

## 2015-10-27 DIAGNOSIS — M797 Fibromyalgia: Secondary | ICD-10-CM | POA: Diagnosis not present

## 2015-10-27 DIAGNOSIS — E559 Vitamin D deficiency, unspecified: Secondary | ICD-10-CM | POA: Diagnosis not present

## 2015-10-27 DIAGNOSIS — M79641 Pain in right hand: Secondary | ICD-10-CM | POA: Diagnosis not present

## 2015-10-27 DIAGNOSIS — M79642 Pain in left hand: Secondary | ICD-10-CM | POA: Diagnosis not present

## 2015-10-27 DIAGNOSIS — Z79899 Other long term (current) drug therapy: Secondary | ICD-10-CM | POA: Diagnosis not present

## 2015-10-27 DIAGNOSIS — R5381 Other malaise: Secondary | ICD-10-CM | POA: Diagnosis not present

## 2015-10-29 DIAGNOSIS — J029 Acute pharyngitis, unspecified: Secondary | ICD-10-CM | POA: Diagnosis not present

## 2015-11-01 DIAGNOSIS — I10 Essential (primary) hypertension: Secondary | ICD-10-CM | POA: Diagnosis not present

## 2015-11-01 DIAGNOSIS — M069 Rheumatoid arthritis, unspecified: Secondary | ICD-10-CM | POA: Diagnosis not present

## 2015-11-01 DIAGNOSIS — J200 Acute bronchitis due to Mycoplasma pneumoniae: Secondary | ICD-10-CM | POA: Diagnosis not present

## 2015-11-01 DIAGNOSIS — J45901 Unspecified asthma with (acute) exacerbation: Secondary | ICD-10-CM | POA: Diagnosis not present

## 2015-11-01 DIAGNOSIS — M797 Fibromyalgia: Secondary | ICD-10-CM | POA: Diagnosis not present

## 2015-11-01 DIAGNOSIS — Z833 Family history of diabetes mellitus: Secondary | ICD-10-CM | POA: Diagnosis not present

## 2015-11-01 DIAGNOSIS — Z8249 Family history of ischemic heart disease and other diseases of the circulatory system: Secondary | ICD-10-CM | POA: Diagnosis not present

## 2015-11-01 DIAGNOSIS — R05 Cough: Secondary | ICD-10-CM | POA: Diagnosis not present

## 2015-11-02 DIAGNOSIS — J029 Acute pharyngitis, unspecified: Secondary | ICD-10-CM | POA: Diagnosis not present

## 2015-11-02 DIAGNOSIS — Z5181 Encounter for therapeutic drug level monitoring: Secondary | ICD-10-CM | POA: Diagnosis not present

## 2015-11-02 DIAGNOSIS — Z79899 Other long term (current) drug therapy: Secondary | ICD-10-CM | POA: Diagnosis not present

## 2015-11-17 DIAGNOSIS — M545 Low back pain: Secondary | ICD-10-CM | POA: Diagnosis not present

## 2015-11-17 DIAGNOSIS — Z7901 Long term (current) use of anticoagulants: Secondary | ICD-10-CM | POA: Diagnosis not present

## 2015-11-17 DIAGNOSIS — I10 Essential (primary) hypertension: Secondary | ICD-10-CM | POA: Diagnosis not present

## 2015-11-17 DIAGNOSIS — M797 Fibromyalgia: Secondary | ICD-10-CM | POA: Diagnosis not present

## 2015-11-17 DIAGNOSIS — R51 Headache: Secondary | ICD-10-CM | POA: Diagnosis not present

## 2015-11-17 DIAGNOSIS — M549 Dorsalgia, unspecified: Secondary | ICD-10-CM | POA: Diagnosis not present

## 2015-11-17 DIAGNOSIS — M069 Rheumatoid arthritis, unspecified: Secondary | ICD-10-CM | POA: Diagnosis not present

## 2015-11-17 DIAGNOSIS — G8929 Other chronic pain: Secondary | ICD-10-CM | POA: Diagnosis not present

## 2015-11-17 DIAGNOSIS — Z8249 Family history of ischemic heart disease and other diseases of the circulatory system: Secondary | ICD-10-CM | POA: Diagnosis not present

## 2015-11-17 DIAGNOSIS — Z79899 Other long term (current) drug therapy: Secondary | ICD-10-CM | POA: Diagnosis not present

## 2015-11-18 DIAGNOSIS — Z803 Family history of malignant neoplasm of breast: Secondary | ICD-10-CM | POA: Diagnosis not present

## 2015-11-18 DIAGNOSIS — Z1379 Encounter for other screening for genetic and chromosomal anomalies: Secondary | ICD-10-CM | POA: Diagnosis not present

## 2015-11-18 DIAGNOSIS — Z8041 Family history of malignant neoplasm of ovary: Secondary | ICD-10-CM | POA: Diagnosis not present

## 2015-11-18 DIAGNOSIS — Z315 Encounter for genetic counseling: Secondary | ICD-10-CM | POA: Diagnosis not present

## 2015-11-18 DIAGNOSIS — Z6841 Body Mass Index (BMI) 40.0 and over, adult: Secondary | ICD-10-CM | POA: Diagnosis not present

## 2015-11-18 DIAGNOSIS — Z01419 Encounter for gynecological examination (general) (routine) without abnormal findings: Secondary | ICD-10-CM | POA: Diagnosis not present

## 2015-11-18 DIAGNOSIS — E119 Type 2 diabetes mellitus without complications: Secondary | ICD-10-CM | POA: Diagnosis not present

## 2015-12-04 ENCOUNTER — Other Ambulatory Visit: Payer: Self-pay | Admitting: Radiology

## 2015-12-04 DIAGNOSIS — Z79899 Other long term (current) drug therapy: Secondary | ICD-10-CM

## 2015-12-23 DIAGNOSIS — Z23 Encounter for immunization: Secondary | ICD-10-CM | POA: Diagnosis not present

## 2015-12-23 DIAGNOSIS — E1165 Type 2 diabetes mellitus with hyperglycemia: Secondary | ICD-10-CM | POA: Diagnosis not present

## 2015-12-23 DIAGNOSIS — R300 Dysuria: Secondary | ICD-10-CM | POA: Diagnosis not present

## 2015-12-23 DIAGNOSIS — I10 Essential (primary) hypertension: Secondary | ICD-10-CM | POA: Diagnosis not present

## 2015-12-24 DIAGNOSIS — E1165 Type 2 diabetes mellitus with hyperglycemia: Secondary | ICD-10-CM | POA: Diagnosis not present

## 2015-12-24 DIAGNOSIS — I10 Essential (primary) hypertension: Secondary | ICD-10-CM | POA: Diagnosis not present

## 2015-12-24 LAB — HEMOGLOBIN A1C: HEMOGLOBIN A1C: 12.1

## 2015-12-29 DIAGNOSIS — Z79899 Other long term (current) drug therapy: Secondary | ICD-10-CM | POA: Diagnosis not present

## 2015-12-29 DIAGNOSIS — M069 Rheumatoid arthritis, unspecified: Secondary | ICD-10-CM | POA: Diagnosis not present

## 2015-12-29 DIAGNOSIS — M797 Fibromyalgia: Secondary | ICD-10-CM | POA: Diagnosis not present

## 2015-12-29 DIAGNOSIS — K3184 Gastroparesis: Secondary | ICD-10-CM | POA: Diagnosis not present

## 2015-12-29 DIAGNOSIS — Z794 Long term (current) use of insulin: Secondary | ICD-10-CM | POA: Diagnosis not present

## 2015-12-29 DIAGNOSIS — R202 Paresthesia of skin: Secondary | ICD-10-CM | POA: Diagnosis not present

## 2015-12-29 DIAGNOSIS — Z7901 Long term (current) use of anticoagulants: Secondary | ICD-10-CM | POA: Diagnosis not present

## 2015-12-29 DIAGNOSIS — M329 Systemic lupus erythematosus, unspecified: Secondary | ICD-10-CM | POA: Diagnosis not present

## 2015-12-29 DIAGNOSIS — I10 Essential (primary) hypertension: Secondary | ICD-10-CM | POA: Diagnosis not present

## 2015-12-29 DIAGNOSIS — E669 Obesity, unspecified: Secondary | ICD-10-CM | POA: Diagnosis not present

## 2015-12-29 DIAGNOSIS — E1165 Type 2 diabetes mellitus with hyperglycemia: Secondary | ICD-10-CM | POA: Diagnosis not present

## 2015-12-29 DIAGNOSIS — E1143 Type 2 diabetes mellitus with diabetic autonomic (poly)neuropathy: Secondary | ICD-10-CM | POA: Diagnosis not present

## 2016-01-13 DIAGNOSIS — E109 Type 1 diabetes mellitus without complications: Secondary | ICD-10-CM | POA: Diagnosis not present

## 2016-01-13 DIAGNOSIS — M3210 Systemic lupus erythematosus, organ or system involvement unspecified: Secondary | ICD-10-CM | POA: Diagnosis not present

## 2016-01-13 DIAGNOSIS — Z794 Long term (current) use of insulin: Secondary | ICD-10-CM | POA: Diagnosis not present

## 2016-01-13 DIAGNOSIS — Z79899 Other long term (current) drug therapy: Secondary | ICD-10-CM | POA: Diagnosis not present

## 2016-02-01 ENCOUNTER — Ambulatory Visit: Payer: Self-pay | Admitting: "Endocrinology

## 2016-03-07 ENCOUNTER — Other Ambulatory Visit: Payer: Self-pay | Admitting: *Deleted

## 2016-03-07 DIAGNOSIS — R05 Cough: Secondary | ICD-10-CM | POA: Diagnosis not present

## 2016-03-07 DIAGNOSIS — E559 Vitamin D deficiency, unspecified: Secondary | ICD-10-CM | POA: Diagnosis not present

## 2016-03-07 DIAGNOSIS — Z79899 Other long term (current) drug therapy: Secondary | ICD-10-CM | POA: Diagnosis not present

## 2016-03-07 DIAGNOSIS — Z5181 Encounter for therapeutic drug level monitoring: Secondary | ICD-10-CM | POA: Diagnosis not present

## 2016-03-07 DIAGNOSIS — J3489 Other specified disorders of nose and nasal sinuses: Secondary | ICD-10-CM | POA: Diagnosis not present

## 2016-03-13 ENCOUNTER — Encounter: Payer: Self-pay | Admitting: "Endocrinology

## 2016-03-13 ENCOUNTER — Ambulatory Visit (INDEPENDENT_AMBULATORY_CARE_PROVIDER_SITE_OTHER): Payer: Medicare Other | Admitting: "Endocrinology

## 2016-03-13 VITALS — BP 118/79 | HR 83 | Ht 65.5 in | Wt 259.0 lb

## 2016-03-13 DIAGNOSIS — Z79899 Other long term (current) drug therapy: Secondary | ICD-10-CM | POA: Diagnosis not present

## 2016-03-13 DIAGNOSIS — Z794 Long term (current) use of insulin: Secondary | ICD-10-CM | POA: Diagnosis not present

## 2016-03-13 DIAGNOSIS — M069 Rheumatoid arthritis, unspecified: Secondary | ICD-10-CM | POA: Diagnosis not present

## 2016-03-13 DIAGNOSIS — Z888 Allergy status to other drugs, medicaments and biological substances status: Secondary | ICD-10-CM | POA: Diagnosis not present

## 2016-03-13 DIAGNOSIS — D649 Anemia, unspecified: Secondary | ICD-10-CM | POA: Diagnosis not present

## 2016-03-13 DIAGNOSIS — E118 Type 2 diabetes mellitus with unspecified complications: Secondary | ICD-10-CM

## 2016-03-13 DIAGNOSIS — Z833 Family history of diabetes mellitus: Secondary | ICD-10-CM | POA: Diagnosis not present

## 2016-03-13 DIAGNOSIS — R079 Chest pain, unspecified: Secondary | ICD-10-CM | POA: Diagnosis not present

## 2016-03-13 DIAGNOSIS — D539 Nutritional anemia, unspecified: Secondary | ICD-10-CM | POA: Diagnosis not present

## 2016-03-13 DIAGNOSIS — E1165 Type 2 diabetes mellitus with hyperglycemia: Secondary | ICD-10-CM | POA: Diagnosis not present

## 2016-03-13 DIAGNOSIS — Z809 Family history of malignant neoplasm, unspecified: Secondary | ICD-10-CM | POA: Diagnosis not present

## 2016-03-13 DIAGNOSIS — Z8249 Family history of ischemic heart disease and other diseases of the circulatory system: Secondary | ICD-10-CM | POA: Diagnosis not present

## 2016-03-13 DIAGNOSIS — R0602 Shortness of breath: Secondary | ICD-10-CM | POA: Diagnosis not present

## 2016-03-13 DIAGNOSIS — E119 Type 2 diabetes mellitus without complications: Secondary | ICD-10-CM | POA: Diagnosis not present

## 2016-03-13 DIAGNOSIS — Z91041 Radiographic dye allergy status: Secondary | ICD-10-CM | POA: Diagnosis not present

## 2016-03-13 DIAGNOSIS — Z882 Allergy status to sulfonamides status: Secondary | ICD-10-CM | POA: Diagnosis not present

## 2016-03-13 DIAGNOSIS — I10 Essential (primary) hypertension: Secondary | ICD-10-CM | POA: Diagnosis not present

## 2016-03-13 DIAGNOSIS — Z7901 Long term (current) use of anticoagulants: Secondary | ICD-10-CM | POA: Diagnosis not present

## 2016-03-13 DIAGNOSIS — IMO0002 Reserved for concepts with insufficient information to code with codable children: Secondary | ICD-10-CM

## 2016-03-13 DIAGNOSIS — Z86711 Personal history of pulmonary embolism: Secondary | ICD-10-CM | POA: Diagnosis not present

## 2016-03-13 DIAGNOSIS — R0789 Other chest pain: Secondary | ICD-10-CM | POA: Diagnosis not present

## 2016-03-13 DIAGNOSIS — Z880 Allergy status to penicillin: Secondary | ICD-10-CM | POA: Diagnosis not present

## 2016-03-13 DIAGNOSIS — M329 Systemic lupus erythematosus, unspecified: Secondary | ICD-10-CM | POA: Diagnosis not present

## 2016-03-13 DIAGNOSIS — M797 Fibromyalgia: Secondary | ICD-10-CM | POA: Diagnosis not present

## 2016-03-13 NOTE — Progress Notes (Signed)
Subjective:    Patient ID: Haley Goodman, female    DOB: 11-10-1980. Patient is being seen in consultation for management of diabetes requested by  Ernestine Conrad, MD  Past Medical History:  Diagnosis Date  . Anemia   . Arthritis   . Fibromyalgia   . Hypertension   . Lupus   . PE (pulmonary embolism) 2009   Past Surgical History:  Procedure Laterality Date  . ABLATION    . CHOLECYSTECTOMY    . DILATION AND CURETTAGE OF UTERUS    . TUBAL LIGATION     Social History   Social History  . Marital status: Married    Spouse name: N/A  . Number of children: N/A  . Years of education: N/A   Social History Main Topics  . Smoking status: Never Smoker  . Smokeless tobacco: Never Used  . Alcohol use No  . Drug use: No  . Sexual activity: Not Asked   Other Topics Concern  . None   Social History Narrative  . None   Outpatient Encounter Prescriptions as of 03/13/2016  Medication Sig  . DULoxetine (CYMBALTA) 30 MG capsule Take 60 mg by mouth at bedtime.   . hydroxychloroquine (PLAQUENIL) 200 MG tablet Take by mouth 2 (two) times daily.  . Insulin Aspart (NOVOLOG FLEXPEN [place]) Inject 10-16 Units into the skin 3 (three) times daily with meals.  . Insulin Glargine (LANTUS SOLOSTAR) 100 UNIT/ML Solostar Pen Inject 30 Units into the skin at bedtime.  Marland Kitchen lisinopril-hydrochlorothiazide (PRINZIDE,ZESTORETIC) 20-12.5 MG tablet Take 1 tablet by mouth daily.  Marland Kitchen LORazepam (ATIVAN) 0.5 MG tablet Take 0.5 mg by mouth 3 (three) times daily as needed for anxiety.  . methocarbamol (ROBAXIN) 500 MG tablet Take 2 tablets (1,000 mg total) by mouth every 8 (eight) hours as needed.  Marland Kitchen oxyCODONE-acetaminophen (PERCOCET) 5-325 MG tablet Take 1 tablet by mouth every 4 (four) hours as needed.  . warfarin (COUMADIN) 7.5 MG tablet Take 5-7.5 mg by mouth daily. Saturday and Sunday take 5mg . All other days take 7.5mg   . ziprasidone (GEODON) 80 MG capsule Take 80 mg by mouth at bedtime.  . metoCLOPramide  (REGLAN) 10 MG tablet Take 1 tablet (10 mg total) by mouth every 6 (six) hours.  . nortriptyline (PAMELOR) 75 MG capsule Take 75 mg by mouth at bedtime.   . ranitidine (ZANTAC) 150 MG tablet Take 150 mg by mouth 2 (two) times daily.  Marland Kitchen zolpidem (AMBIEN) 10 MG tablet Take 10 mg by mouth at bedtime as needed. For sleep  . [DISCONTINUED] esomeprazole (NEXIUM) 40 MG capsule Take 40 mg by mouth daily at 12 noon.  . [DISCONTINUED] fluticasone (FLONASE) 50 MCG/ACT nasal spray Place 2 sprays into both nostrils daily as needed for allergies or rhinitis.  . [DISCONTINUED] hydroxychloroquine (PLAQUENIL) 200 MG tablet Take 200 mg by mouth 2 (two) times daily.   No facility-administered encounter medications on file as of 03/13/2016.    ALLERGIES: Allergies  Allergen Reactions  . Codeine Anaphylaxis and Swelling  . Compazine     Not in right state of mind.   [name] [Iodinated Diagnostic Agents] Nausea Only  . Nitrofurantoin Monohyd Macro Hives and Swelling  . Penicillins Hives  . Sulfasalazine Hives   VACCINATION STATUS:  There is no immunization history on file for this patient.  Diabetes  She presents for her initial diabetic visit. She has type 2 diabetes mellitus. Onset time: She was diagnosed at approximate age of 33 years after several years  of exposure to high-dose steroids related to her lupus. Her disease course has been worsening. There are no hypoglycemic associated symptoms. Pertinent negatives for hypoglycemia include no confusion, headaches, pallor or seizures. Associated symptoms include blurred vision, fatigue, polydipsia and polyuria. Pertinent negatives for diabetes include no chest pain and no polyphagia. There are no hypoglycemic complications. Symptoms are worsening. Risk factors for coronary artery disease include diabetes mellitus, hypertension, obesity and sedentary lifestyle. Current diabetic treatment includes oral agent (dual therapy) and insulin injections. Her weight is  increasing steadily. She is following a generally unhealthy diet. When asked about meal planning, she reported none. She has not had a previous visit with a dietitian. She rarely participates in exercise. Home blood sugar record trend: She did not bring any meter nor logs to review today. An ACE inhibitor/angiotensin II receptor blocker is being taken. Eye exam is current.  Hypertension  This is a chronic problem. The current episode started more than 1 year ago. The problem is controlled. Associated symptoms include blurred vision. Pertinent negatives include no chest pain, headaches, palpitations or shortness of breath. Risk factors for coronary artery disease include diabetes mellitus, sedentary lifestyle and obesity. Past treatments include ACE inhibitors and diuretics.       Review of Systems  Constitutional: Positive for fatigue. Negative for chills, fever and unexpected weight change.  HENT: Negative for trouble swallowing and voice change.   Eyes: Positive for blurred vision. Negative for visual disturbance.  Respiratory: Negative for cough, shortness of breath and wheezing.   Cardiovascular: Negative for chest pain, palpitations and leg swelling.  Gastrointestinal: Negative for diarrhea, nausea and vomiting.  Endocrine: Positive for polydipsia and polyuria. Negative for cold intolerance, heat intolerance and polyphagia.  Musculoskeletal: Negative for arthralgias and myalgias.  Skin: Negative for color change, pallor, rash and wound.  Neurological: Negative for seizures and headaches.  Psychiatric/Behavioral: Negative for confusion and suicidal ideas.    Objective:    BP 118/79   Pulse 83   Ht 5' 5.5" (1.664 m)   Wt 259 lb (117.5 kg)   BMI 42.44 kg/m   Wt Readings from Last 3 Encounters:  03/13/16 259 lb (117.5 kg)  02/17/15 223 lb (101.2 kg)  08/20/14 243 lb (110.2 kg)    Physical Exam  Constitutional: She is oriented to person, place, and time. She appears  well-developed.  HENT:  Head: Normocephalic and atraumatic.  Eyes: EOM are normal.  Neck: Normal range of motion. Neck supple. No tracheal deviation present. No thyromegaly present.  Cardiovascular: Normal rate and regular rhythm.   Pulmonary/Chest: Effort normal and breath sounds normal.  Abdominal: Soft. Bowel sounds are normal. There is no tenderness. There is no guarding.  Musculoskeletal: Normal range of motion. She exhibits no edema.  Neurological: She is alert and oriented to person, place, and time. She has normal reflexes. No cranial nerve deficit. Coordination normal.  Skin: Skin is warm and dry. No rash noted. No erythema. No pallor.  Psychiatric: She has a normal mood and affect. Judgment normal.     CMP ( most recent) CMP     Component Value Date/Time   NA 135 02/17/2015 0340   K 4.0 02/17/2015 0340   CL 108 02/17/2015 0340   CO2 20 (L) 02/17/2015 0340   GLUCOSE 145 (H) 02/17/2015 0340   BUN 12 02/17/2015 0340   CREATININE 1.01 (H) 02/17/2015 0340   CALCIUM 8.8 (L) 02/17/2015 0340   PROT 8.7 (H) 03/12/2009 1553   ALBUMIN 4.0 03/12/2009 1553  AST 30 03/12/2009 1553   ALT 45 (H) 03/12/2009 1553   ALKPHOS 96 03/12/2009 1553   BILITOT 0.2 (L) 03/12/2009 1553   GFRNONAA >60 02/17/2015 0340   GFRAA >60 02/17/2015 0340    12/24/2015 A1c was 12.1%.  Assessment & Plan:   1. Uncontrolled type 2 diabetes mellitus with complication, without long-term current use of insulin (HCC) - Patient has currently uncontrolled symptomatic type 2 DM since  36 years of age,  with most recent A1c of 12.1 %. Recent labs reviewed.   Her diabetes is complicated by obesity/sedentary life, history of lupus which requires on and off steroid therapy and patient remains at a high risk for more acute and chronic complications of diabetes which include CAD, CVA, CKD, retinopathy, and neuropathy. These are all discussed in detail with the patient.  - I have counseled the patient on diet  management and weight loss, by adopting a carbohydrate restricted/protein rich diet.  - Suggestion is made for patient to avoid simple carbohydrates   from their diet including Cakes , Desserts, Ice Cream,  Soda (  diet and regular) , Sweet Tea , Candies,  Chips, Cookies, Artificial Sweeteners,   and "Sugar-free" Products . This will help patient to have stable blood glucose profile and potentially avoid unintended weight gain.  - I encouraged the patient to switch to  unprocessed or minimally processed complex starch and increased protein intake (animal or plant source), fruits, and vegetables.  - Patient is advised to stick to a routine mealtimes to eat 3 meals  a day and avoid unnecessary snacks ( to snack only to correct hypoglycemia).  - The patient will be scheduled with Norm Salt, RDN, CDE for individualized DM education.  - I have approached patient with the following individualized plan to manage diabetes and patient agrees:   - I  will proceed to readjust her basal insulin Lantus to 30 units QHS, and prandial insulin NovoLog 10 units TIDAC for pre-meal BG readings of 90-150mg /dl, plus patient specific correction dose for unexpected hyperglycemia above 150mg /dl, associated with strict monitoring of glucose  AC and HS. - Patient is warned not to take insulin without proper monitoring per orders. -Adjustment parameters are given for hypo and hyperglycemia in writing. -Patient is encouraged to call clinic for blood glucose levels less than 70 or above 300 mg /dl. - She reports intolerance to metformin. - Patient will be considered for incretin therapy as appropriate next visit. - Patient specific target  A1c;  LDL, HDL, Triglycerides, and  Waist Circumference were discussed in detail.  2) BP/HTN: Controlled. Continue current medications including ACEI/ARB. 3) Lipids/HPL:   Control unknown. Patient is not on statins. I will obtain fasting lipid panel on subsequent labs.  Patient is  advised tocontinue statins. 4)  Weight/Diet: CDE Consult will be initiated , exercise, and detailed carbohydrates information provided.  5) Chronic Care/Health Maintenance:  -Patient is on ACEI/ARB  medications and encouraged to continue to follow up with Ophthalmology, Podiatrist at least yearly or according to recommendations, and advised to   stay away from smoking. I have recommended yearly flu vaccine and pneumonia vaccination at least every 5 years; moderate intensity exercise for up to 150 minutes weekly; and  sleep for at least 7 hours a day.  - 60 minutes of time was spent on the care of this patient , 50% of which was applied for counseling on diabetes complications and their preventions.  - Patient to bring meter and  blood glucose logs  during her next visit.   - I advised patient to maintain close follow up with Ernestine Conrad, MD for primary care needs.  Follow up plan: - Return in about 1 week (around 03/20/2016) for follow up with meter and logs- no labs.  Marquis Lunch, MD Phone: [phone]  Fax: [phone]   03/13/2016, 4:12 PM

## 2016-03-13 NOTE — Patient Instructions (Signed)

## 2016-03-20 DIAGNOSIS — R1032 Left lower quadrant pain: Secondary | ICD-10-CM | POA: Diagnosis not present

## 2016-03-20 DIAGNOSIS — M329 Systemic lupus erythematosus, unspecified: Secondary | ICD-10-CM | POA: Diagnosis not present

## 2016-03-20 DIAGNOSIS — I10 Essential (primary) hypertension: Secondary | ICD-10-CM | POA: Diagnosis not present

## 2016-03-20 DIAGNOSIS — R079 Chest pain, unspecified: Secondary | ICD-10-CM | POA: Diagnosis not present

## 2016-03-20 DIAGNOSIS — Z8249 Family history of ischemic heart disease and other diseases of the circulatory system: Secondary | ICD-10-CM | POA: Diagnosis not present

## 2016-03-20 DIAGNOSIS — G8929 Other chronic pain: Secondary | ICD-10-CM | POA: Diagnosis not present

## 2016-03-20 DIAGNOSIS — M797 Fibromyalgia: Secondary | ICD-10-CM | POA: Diagnosis not present

## 2016-03-20 DIAGNOSIS — Z794 Long term (current) use of insulin: Secondary | ICD-10-CM | POA: Diagnosis not present

## 2016-03-20 DIAGNOSIS — Z7901 Long term (current) use of anticoagulants: Secondary | ICD-10-CM | POA: Diagnosis not present

## 2016-03-20 DIAGNOSIS — R0789 Other chest pain: Secondary | ICD-10-CM | POA: Diagnosis not present

## 2016-03-20 DIAGNOSIS — R000 Tachycardia, unspecified: Secondary | ICD-10-CM | POA: Diagnosis not present

## 2016-03-20 DIAGNOSIS — R0602 Shortness of breath: Secondary | ICD-10-CM | POA: Diagnosis not present

## 2016-03-20 DIAGNOSIS — M069 Rheumatoid arthritis, unspecified: Secondary | ICD-10-CM | POA: Diagnosis not present

## 2016-03-20 DIAGNOSIS — Z79899 Other long term (current) drug therapy: Secondary | ICD-10-CM | POA: Diagnosis not present

## 2016-03-21 ENCOUNTER — Ambulatory Visit: Payer: Medicare Other | Admitting: "Endocrinology

## 2016-03-27 DIAGNOSIS — B028 Zoster with other complications: Secondary | ICD-10-CM | POA: Diagnosis not present

## 2016-03-30 ENCOUNTER — Other Ambulatory Visit (HOSPITAL_BASED_OUTPATIENT_CLINIC_OR_DEPARTMENT_OTHER): Payer: Self-pay

## 2016-03-30 DIAGNOSIS — G4733 Obstructive sleep apnea (adult) (pediatric): Secondary | ICD-10-CM

## 2016-04-07 ENCOUNTER — Ambulatory Visit: Payer: Medicare Other | Admitting: "Endocrinology

## 2016-04-25 DIAGNOSIS — R05 Cough: Secondary | ICD-10-CM | POA: Diagnosis not present

## 2016-04-25 DIAGNOSIS — J029 Acute pharyngitis, unspecified: Secondary | ICD-10-CM | POA: Diagnosis not present

## 2016-04-26 ENCOUNTER — Other Ambulatory Visit: Payer: Self-pay | Admitting: *Deleted

## 2016-04-26 MED ORDER — HYDROXYCHLOROQUINE SULFATE 200 MG PO TABS: ORAL_TABLET | ORAL | 1 refills | Status: DC

## 2016-04-26 NOTE — Telephone Encounter (Signed)
Refill request received via fax for PLQ.   Left message for patient to call the office. Will remind patient she is due to update PLQ Eye exam.   Last Visit: 10/27/15 Next Visit: 05/16/16 Labs: 03/07/16 Elevated glucose PLQ Eye Exam: 04/21/15 WNL  Okay to refill PLQ?

## 2016-04-26 NOTE — Telephone Encounter (Signed)
ok 

## 2016-05-10 DIAGNOSIS — E559 Vitamin D deficiency, unspecified: Secondary | ICD-10-CM

## 2016-05-10 DIAGNOSIS — M19042 Primary osteoarthritis, left hand: Secondary | ICD-10-CM

## 2016-05-10 DIAGNOSIS — M47816 Spondylosis without myelopathy or radiculopathy, lumbar region: Secondary | ICD-10-CM | POA: Insufficient documentation

## 2016-05-10 DIAGNOSIS — G894 Chronic pain syndrome: Secondary | ICD-10-CM | POA: Insufficient documentation

## 2016-05-10 DIAGNOSIS — M797 Fibromyalgia: Secondary | ICD-10-CM | POA: Insufficient documentation

## 2016-05-10 DIAGNOSIS — M19041 Primary osteoarthritis, right hand: Secondary | ICD-10-CM | POA: Insufficient documentation

## 2016-05-10 DIAGNOSIS — R778 Other specified abnormalities of plasma proteins: Secondary | ICD-10-CM | POA: Insufficient documentation

## 2016-05-10 DIAGNOSIS — Z79899 Other long term (current) drug therapy: Secondary | ICD-10-CM | POA: Insufficient documentation

## 2016-05-10 DIAGNOSIS — M0579 Rheumatoid arthritis with rheumatoid factor of multiple sites without organ or systems involvement: Secondary | ICD-10-CM | POA: Insufficient documentation

## 2016-05-10 HISTORY — DX: Vitamin D deficiency, unspecified: E55.9

## 2016-05-10 HISTORY — DX: Other specified abnormalities of plasma proteins: R77.8

## 2016-05-10 HISTORY — DX: Chronic pain syndrome: G89.4

## 2016-05-10 HISTORY — DX: Spondylosis without myelopathy or radiculopathy, lumbar region: M47.816

## 2016-05-10 NOTE — Progress Notes (Signed)
Office Visit Note  Patient: Haley Goodman             Date of Birth: April 01, 1980           MRN: [id number]             PCP: Ernestine Conrad, MD Referring: Ernestine Conrad, MD Visit Date: 05/16/2016 Occupation: @GUAROCC @    Subjective:  Neck and lower back pain.   History of Present Illness: Haley Goodman is a 36 y.o. female with history of sero positive rheumatoid arthritis. Patient states with the weather change she's been having increased neck and lower back pain. She denies any joint swelling. She's been tolerating Plaquenil well and she had recent eye exam to evaluate for Plaquenil toxicity. She continues to have some discomfort from fibromyalgia. She rates her pain on scale of 0-10 about 9 and fatigue at about 10. Patient states she got shingles in January on her back. The postherpetic neuralgia has resolved.  Activities of Daily Living:  Patient reports morning stiffness for all day hours.   Patient Reports nocturnal pain.  Difficulty dressing/grooming: Denies Difficulty climbing stairs: Reports Difficulty getting out of chair: Denies Difficulty using hands for taps, buttons, cutlery, and/or writing: Reports   Review of Systems  Constitutional: Positive for fatigue and weight gain. Negative for night sweats, weight loss and weakness.  HENT: Positive for mouth dryness. Negative for mouth sores, trouble swallowing, trouble swallowing and nose dryness.   Eyes: Positive for dryness. Negative for pain, redness and visual disturbance.  Respiratory: Negative for cough, shortness of breath and difficulty breathing.   Cardiovascular: Negative for chest pain, palpitations, hypertension, irregular heartbeat and swelling in legs/feet.  Gastrointestinal: Positive for constipation. Negative for blood in stool and diarrhea.  Endocrine: Negative for increased urination.  Genitourinary: Negative for vaginal dryness.  Musculoskeletal: Positive for arthralgias, joint pain, myalgias,  morning stiffness and myalgias. Negative for joint swelling, muscle weakness and muscle tenderness.  Skin: Negative for color change, rash, hair loss, skin tightness, ulcers and sensitivity to sunlight.  Allergic/Immunologic: Negative for susceptible to infections.  Neurological: Negative for dizziness, memory loss and night sweats.  Hematological: Negative for swollen glands.  Psychiatric/Behavioral: Positive for sleep disturbance. Negative for depressed mood. The patient is not nervous/anxious.     PMFS History:  Patient Active Problem List   Diagnosis Date Noted  . Rheumatoid arthritis involving multiple sites with positive rheumatoid factor (HCC) 05/10/2016  . Fibromyalgia 05/10/2016  . Primary osteoarthritis of both hands 05/10/2016  . Chronic pain syndrome/ in pain manaement 05/10/2016  . High risk medication use/ Plaquenil 05/10/2016  . Spondylosis of lumbar region without myelopathy or radiculopathy 05/10/2016  . Abnormal serum protein electrophoresis 05/10/2016  . Vitamin D deficiency 05/10/2016  . Uncontrolled type 2 diabetes mellitus with complication, without long-term current use of insulin (HCC) 03/13/2016  . Essential hypertension, benign 03/13/2016  . Morbid obesity (HCC) 03/13/2016  . Pulmonary embolism (HCC) 06/06/2011    Class: History of    Past Medical History:  Diagnosis Date  . Anemia   . Arthritis   . Fibromyalgia   . Hypertension   . Lupus   . PE (pulmonary embolism) 2009    Family History  Problem Relation Age of Onset  . Stroke Mother   . Cancer Other   . Diabetes Other   . Pseudochol deficiency Neg Hx   . Malignant hyperthermia Neg Hx   . Hypotension Neg Hx   . Anesthesia problems Neg Hx    Past  Surgical History:  Procedure Laterality Date  . ABLATION    . CHOLECYSTECTOMY    . DILATION AND CURETTAGE OF UTERUS    . TUBAL LIGATION     Social History   Social History Narrative  . No narrative on file     Objective: Vital Signs: BP  114/80   Pulse 81   Resp 14   Ht 5\' 7"  (1.702 m)   Wt 257 lb (116.6 kg)   BMI 40.25 kg/m    Physical Exam  Constitutional: She is oriented to person, place, and time. She appears well-developed and well-nourished.  HENT:  Head: Normocephalic and atraumatic.  Eyes: Conjunctivae and EOM are normal.  Neck: Normal range of motion.  Cardiovascular: Normal rate, regular rhythm, normal heart sounds and intact distal pulses.   Pulmonary/Chest: Effort normal and breath sounds normal.  Abdominal: Soft. Bowel sounds are normal.  Lymphadenopathy:    She has no cervical adenopathy.  Neurological: She is alert and oriented to person, place, and time.  Skin: Skin is warm and dry. Capillary refill takes less than 2 seconds.  Psychiatric: She has a normal mood and affect. Her behavior is normal.  Nursing note and vitals reviewed.    Musculoskeletal Exam: C-spine and thoracic spine good range of motion with some discomfort in the C-spine. She has limited range of motion of her lumbar spine. She has discomfort in her lower back. Shoulder joints although joints wrist joint MCPs PIPs DIPs with good range of motion with no synovitis. Hip joints knee joints ankles MTPs PIPs DIPs with good range of motion with no synovitis.  CDAI Exam: CDAI Homunculus Exam:   Joint Counts:  CDAI Tender Joint count: 0 CDAI Swollen Joint count: 0  Global Assessments:  Patient Global Assessment: 5 Provider Global Assessment: 3  CDAI Calculated Score: 8    Investigation: Findings:  Labs from April 23, 2015, show hep panel is negative.  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  CMP is normal except for elevated glucose, but close to normal limits  04/16/2015 X-rays of bilateral hands, 2 views, versus October 2011 show mild DIP and PIP joint space narrowing bilaterally, no erosions, no changes versus 2011.   X-rays of  bilateral feet, 2 views, versus April 2010 show DIP and PIP narrowing bilaterally, bilateral 1st MTP narrowing, no erosions, slightly progressed versus April 2010.  Namely, there is less DIP and PIP space on this x-ray versus the last x-ray.  10/28/2015 CBC normal except for hemoglobin 11.0, Hematocrit 34.4 (consistent with previous) CMP shows glucose elevated 132 ALT elevated 35, Vitamin D low, 27.  03/07/2016 CBC normal except for hemoglobin low, CMP normal. Patient reports a normal eye exam this year  Office Visit on 03/13/2016  Component Date Value Ref Range Status  . Hemoglobin A1C 12/24/2015 12.1   Final     Imaging: No results found.  Speciality Comments: No specialty comments available.    Procedures:  No procedures performed Allergies: Codeine; Compazine; Ivp dye [iodinated diagnostic agents]; Nitrofurantoin monohyd macro; Penicillins; and Sulfasalazine   Assessment / Plan:     Visit Diagnoses: Rheumatoid arthritis involving multiple sites with positive rheumatoid factor (HCC) - + RF +CCP . She has no synovitis on examination  High risk medication use/ Plaquenil: She is tolerating medication well her labs are stable according to patient her eye exam was normal as well  Neck pain - she's been having a  lot of neck discomfort Plan: Ambulatory referral to Physical Therapy  Chronic midline low back pain without sciatica -she has lower back pain which she describes moderate to severe currently Plan: Ambulatory referral to Physical Therapy  Primary osteoarthritis of both hands: Joint protection and muscle strengthening discussed.  Fibromyalgia: She has chronic pain from fibromyalgia.  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 Ambulatory referral to Physical Therapy   No orders of the defined types were placed in this encounter.   Face-to-face time spent with patient was 30 minutes. 50% of time was spent in counseling and coordination of care.  Follow-Up Instructions: Return in about 5 months (around 10/16/2016) for Rheumatoid arthritis.   Pollyann Savoy, MD  Note - This record has been created using Animal nutritionist.  Chart creation errors have been sought, but may not always  have been located. Such creation errors do not reflect on  the standard of medical care.

## 2016-05-16 ENCOUNTER — Encounter: Payer: Self-pay | Admitting: Rheumatology

## 2016-05-16 ENCOUNTER — Ambulatory Visit (INDEPENDENT_AMBULATORY_CARE_PROVIDER_SITE_OTHER): Payer: Medicare Other | Admitting: Rheumatology

## 2016-05-16 VITALS — BP 114/80 | HR 81 | Resp 14 | Ht 67.0 in | Wt 257.0 lb

## 2016-05-16 DIAGNOSIS — M797 Fibromyalgia: Secondary | ICD-10-CM | POA: Diagnosis not present

## 2016-05-16 DIAGNOSIS — M542 Cervicalgia: Secondary | ICD-10-CM

## 2016-05-16 DIAGNOSIS — R778 Other specified abnormalities of plasma proteins: Secondary | ICD-10-CM | POA: Diagnosis not present

## 2016-05-16 DIAGNOSIS — G8929 Other chronic pain: Secondary | ICD-10-CM | POA: Diagnosis not present

## 2016-05-16 DIAGNOSIS — G894 Chronic pain syndrome: Secondary | ICD-10-CM

## 2016-05-16 DIAGNOSIS — M545 Low back pain, unspecified: Secondary | ICD-10-CM

## 2016-05-16 DIAGNOSIS — Z79899 Other long term (current) drug therapy: Secondary | ICD-10-CM

## 2016-05-16 DIAGNOSIS — M19041 Primary osteoarthritis, right hand: Secondary | ICD-10-CM

## 2016-05-16 DIAGNOSIS — M47816 Spondylosis without myelopathy or radiculopathy, lumbar region: Secondary | ICD-10-CM

## 2016-05-16 DIAGNOSIS — M0579 Rheumatoid arthritis with rheumatoid factor of multiple sites without organ or systems involvement: Secondary | ICD-10-CM | POA: Diagnosis not present

## 2016-05-16 DIAGNOSIS — E559 Vitamin D deficiency, unspecified: Secondary | ICD-10-CM | POA: Diagnosis not present

## 2016-05-16 DIAGNOSIS — M19042 Primary osteoarthritis, left hand: Secondary | ICD-10-CM

## 2016-05-16 NOTE — Patient Instructions (Signed)
Standing Labs We placed an order today for your standing lab work.    Please come back and get your standing labs in June  We have open lab Monday through Friday from 8:30-11:30 AM and 1:30-4 PM at the office of Dr. Dereka Lueras/Naitik Panwala, PA.   The office is located at [address], [address], [place], [place] [address] No appointment is necessary.   Labs are drawn by [facility].  You may receive a bill from [facility] for your lab work.    

## 2016-05-18 DIAGNOSIS — R319 Hematuria, unspecified: Secondary | ICD-10-CM | POA: Diagnosis not present

## 2016-05-18 DIAGNOSIS — R109 Unspecified abdominal pain: Secondary | ICD-10-CM | POA: Diagnosis not present

## 2016-05-18 DIAGNOSIS — Z79899 Other long term (current) drug therapy: Secondary | ICD-10-CM | POA: Diagnosis not present

## 2016-05-18 DIAGNOSIS — Z91041 Radiographic dye allergy status: Secondary | ICD-10-CM | POA: Diagnosis not present

## 2016-05-18 DIAGNOSIS — Z882 Allergy status to sulfonamides status: Secondary | ICD-10-CM | POA: Diagnosis not present

## 2016-05-18 DIAGNOSIS — M069 Rheumatoid arthritis, unspecified: Secondary | ICD-10-CM | POA: Diagnosis not present

## 2016-05-18 DIAGNOSIS — I10 Essential (primary) hypertension: Secondary | ICD-10-CM | POA: Diagnosis not present

## 2016-05-18 DIAGNOSIS — Z880 Allergy status to penicillin: Secondary | ICD-10-CM | POA: Diagnosis not present

## 2016-05-18 DIAGNOSIS — N390 Urinary tract infection, site not specified: Secondary | ICD-10-CM | POA: Diagnosis not present

## 2016-05-18 DIAGNOSIS — M797 Fibromyalgia: Secondary | ICD-10-CM | POA: Diagnosis not present

## 2016-05-18 DIAGNOSIS — Z7901 Long term (current) use of anticoagulants: Secondary | ICD-10-CM | POA: Diagnosis not present

## 2016-05-18 DIAGNOSIS — M329 Systemic lupus erythematosus, unspecified: Secondary | ICD-10-CM | POA: Diagnosis not present

## 2016-05-18 DIAGNOSIS — Z888 Allergy status to other drugs, medicaments and biological substances status: Secondary | ICD-10-CM | POA: Diagnosis not present

## 2016-05-29 DIAGNOSIS — J1089 Influenza due to other identified influenza virus with other manifestations: Secondary | ICD-10-CM | POA: Diagnosis not present

## 2016-05-29 DIAGNOSIS — Z8249 Family history of ischemic heart disease and other diseases of the circulatory system: Secondary | ICD-10-CM | POA: Diagnosis not present

## 2016-05-29 DIAGNOSIS — I10 Essential (primary) hypertension: Secondary | ICD-10-CM | POA: Diagnosis not present

## 2016-05-29 DIAGNOSIS — R05 Cough: Secondary | ICD-10-CM | POA: Diagnosis not present

## 2016-05-29 DIAGNOSIS — R509 Fever, unspecified: Secondary | ICD-10-CM | POA: Diagnosis not present

## 2016-05-29 DIAGNOSIS — J101 Influenza due to other identified influenza virus with other respiratory manifestations: Secondary | ICD-10-CM | POA: Diagnosis not present

## 2016-05-29 DIAGNOSIS — Z79899 Other long term (current) drug therapy: Secondary | ICD-10-CM | POA: Diagnosis not present

## 2016-05-29 DIAGNOSIS — M797 Fibromyalgia: Secondary | ICD-10-CM | POA: Diagnosis not present

## 2016-05-29 DIAGNOSIS — M069 Rheumatoid arthritis, unspecified: Secondary | ICD-10-CM | POA: Diagnosis not present

## 2016-05-31 DIAGNOSIS — J101 Influenza due to other identified influenza virus with other respiratory manifestations: Secondary | ICD-10-CM | POA: Diagnosis not present

## 2016-05-31 DIAGNOSIS — R51 Headache: Secondary | ICD-10-CM | POA: Diagnosis not present

## 2016-05-31 DIAGNOSIS — R05 Cough: Secondary | ICD-10-CM | POA: Diagnosis not present

## 2016-06-21 ENCOUNTER — Encounter (INDEPENDENT_AMBULATORY_CARE_PROVIDER_SITE_OTHER): Payer: Self-pay

## 2016-06-21 ENCOUNTER — Ambulatory Visit: Payer: Medicare Other | Attending: Neurology | Admitting: Neurology

## 2016-06-21 DIAGNOSIS — G4733 Obstructive sleep apnea (adult) (pediatric): Secondary | ICD-10-CM | POA: Diagnosis not present

## 2016-06-21 DIAGNOSIS — G478 Other sleep disorders: Secondary | ICD-10-CM | POA: Diagnosis not present

## 2016-06-22 ENCOUNTER — Other Ambulatory Visit (HOSPITAL_BASED_OUTPATIENT_CLINIC_OR_DEPARTMENT_OTHER): Payer: Self-pay

## 2016-06-22 DIAGNOSIS — G4733 Obstructive sleep apnea (adult) (pediatric): Secondary | ICD-10-CM

## 2016-06-27 NOTE — Procedures (Signed)
[facility] Ivyanna Sibert A. Gerilyn Pilgrim, MD     [email or web address]             NOCTURNAL POLYSOMNOGRAPHY   LOCATION: [facility]   Patient Name: Haley Goodman, Haley Goodman Date: 06/21/2016 Gender: Female D.O.B: May 02, 1980 Age (years): 35 Referring Provider: Jorge Mandril NP Height (inches): 67 Interpreting Physician: Beryle Beams MD, ABSM Weight (lbs): 254 RPSGT: Peak, Robert BMI: 40 MRN: [id number] Neck Size: 16.50 CLINICAL INFORMATION Sleep Study Type: NPSG  Indication for sleep study: N/A  Epworth Sleepiness Score:  SLEEP STUDY TECHNIQUE As per the AASM Manual for the Scoring of Sleep and Associated Events v2.3 (April 2016) with a hypopnea requiring 4% desaturations.  The channels recorded and monitored were frontal, central and occipital EEG, electrooculogram (EOG), submentalis EMG (chin), nasal and oral airflow, thoracic and abdominal wall motion, anterior tibialis EMG, snore microphone, electrocardiogram, and pulse oximetry.  MEDICATIONS Medications self-administered by patient taken the night of the study : ZIPRASIDONE HCL, HYDROXYZINE, ROPINIROLE HCL, PROMETHAZINE, OXYCODONE HCL, WARFARIN, TEMAZEPAM, TROKENDI XR, BUTRANS PATCH   Current Outpatient Prescriptions:  .  Cholecalciferol (VITAMIN D3) 50000 units CAPS, TAKE 1 CAPSULE BY MOUTH WEEKLY, Disp: , Rfl: 0 .  DULoxetine (CYMBALTA) 30 MG capsule, Take 60 mg by mouth at bedtime. , Disp: , Rfl:  .  esomeprazole (NEXIUM) 40 MG capsule, Take 40 mg by mouth daily., Disp: , Rfl: 0 .  hydroxychloroquine (PLAQUENIL) 200 MG tablet, Take 1 tablet by mouth twice daily Monday thru Friday., Disp: 120 tablet, Rfl: 1 .  Insulin Aspart (NOVOLOG FLEXPEN [place]), Inject 10-16 Units into the skin 3 (three) times daily with meals., Disp: , Rfl:  .  Insulin Glargine (LANTUS SOLOSTAR) 100 UNIT/ML Solostar Pen, Inject 30 Units into the skin at bedtime., Disp: , Rfl:  .  lisinopril-hydrochlorothiazide (PRINZIDE,ZESTORETIC)  20-12.5 MG tablet, Take 1 tablet by mouth daily., Disp: , Rfl:  .  LORazepam (ATIVAN) 0.5 MG tablet, Take 0.5 mg by mouth 3 (three) times daily as needed for anxiety., Disp: , Rfl:  .  methocarbamol (ROBAXIN) 500 MG tablet, Take 2 tablets (1,000 mg total) by mouth every 8 (eight) hours as needed. (Patient not taking: Reported on 05/16/2016), Disp: 30 tablet, Rfl: 0 .  metoCLOPramide (REGLAN) 10 MG tablet, Take 1 tablet (10 mg total) by mouth every 6 (six) hours. (Patient not taking: Reported on 05/16/2016), Disp: 8 tablet, Rfl: 0 .  nortriptyline (PAMELOR) 75 MG capsule, Take 75 mg by mouth at bedtime. , Disp: , Rfl:  .  oxyCODONE-acetaminophen (PERCOCET) 5-325 MG tablet, Take 1 tablet by mouth every 4 (four) hours as needed., Disp: 15 tablet, Rfl: 0 .  ranitidine (ZANTAC) 150 MG tablet, Take 150 mg by mouth 2 (two) times daily., Disp: , Rfl:  .  temazepam (RESTORIL) 22.5 MG capsule, Take by mouth., Disp: , Rfl:  .  tizanidine (ZANAFLEX) 6 MG capsule, Take by mouth., Disp: , Rfl:  .  warfarin (COUMADIN) 7.5 MG tablet, Take 5-7.5 mg by mouth daily. Saturday and Sunday take 5mg . All other days take 7.5mg , Disp: , Rfl:  .  ziprasidone (GEODON) 80 MG capsule, Take 80 mg by mouth at bedtime., Disp: , Rfl:  .  zolpidem (AMBIEN) 10 MG tablet, Take 10 mg by mouth at bedtime as needed. For sleep, Disp: , Rfl:    SLEEP ARCHITECTURE The study was initiated at 9:40:19 PM and ended at 5:02:51 AM.  Sleep onset time was 80.4 minutes and the sleep efficiency was 58.5%. The total sleep time was 259.1  minutes.  Stage REM latency was N/A minutes.  The patient spent 10.23% of the night in stage N1 sleep, 82.82% in stage N2 sleep, 6.95% in stage N3 and 0.00% in REM.  Alpha intrusion was absent.  Supine sleep was 0.00%.  RESPIRATORY PARAMETERS The overall apnea/hypopnea index (AHI) was 0.2 per hour. There were 0 total apneas, including 0 obstructive, 0 central and 0 mixed apneas. There were 1 hypopneas and 9  RERAs.  The AHI during Stage REM sleep was N/A per hour.  AHI while supine was N/A per hour.  The mean oxygen saturation was 95.76%. The minimum SpO2 during sleep was 91.00%.  Moderate snoring was noted during this study.  CARDIAC DATA The 2 lead EKG demonstrated sinus rhythm. The mean heart rate was 78.69 beats per minute. Other EKG findings include: None. LEG MOVEMENT DATA The total PLMS were 0 with a resulting PLMS index of 0.00. Associated arousal with leg movement index was 0.0.  IMPRESSIONS - Abnormal sleep architecture is observed with reduced sleep efficiency, absent REM sleep and reduced slow wave sleep. Otherwise, this recording is unremarkable.  Argie Ramming, MD Diplomate, American Board of Sleep Medicine.  ELECTRONICALLY SIGNED ON:  06/27/2016, 10:09 AM [facility] PH: [phone]   FX: [phone] ACCREDITED BY THE AMERICAN ACADEMY OF SLEEP MEDICINE

## 2016-07-07 ENCOUNTER — Other Ambulatory Visit: Payer: Self-pay

## 2016-07-07 MED ORDER — INSULIN GLARGINE 100 UNIT/ML SOLOSTAR PEN: 30.0000 [IU] | PEN_INJECTOR | Freq: Every day | SUBCUTANEOUS | 2 refills | Status: DC

## 2016-07-07 MED ORDER — INSULIN DETEMIR 100 UNIT/ML FLEXPEN: 30.0000 [IU] | PEN_INJECTOR | Freq: Every day | SUBCUTANEOUS | 2 refills | Status: DC
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
## 2016-08-21 ENCOUNTER — Encounter (INDEPENDENT_AMBULATORY_CARE_PROVIDER_SITE_OTHER): Payer: Self-pay

## 2016-08-21 ENCOUNTER — Ambulatory Visit (INDEPENDENT_AMBULATORY_CARE_PROVIDER_SITE_OTHER): Payer: Medicare Other | Admitting: Psychiatry

## 2016-08-21 ENCOUNTER — Encounter (HOSPITAL_COMMUNITY): Payer: Self-pay | Admitting: Psychiatry

## 2016-08-21 VITALS — BP 125/69 | HR 89 | Ht 67.0 in | Wt 250.0 lb

## 2016-08-21 DIAGNOSIS — Z818 Family history of other mental and behavioral disorders: Secondary | ICD-10-CM

## 2016-08-21 DIAGNOSIS — F331 Major depressive disorder, recurrent, moderate: Secondary | ICD-10-CM

## 2016-08-21 DIAGNOSIS — M069 Rheumatoid arthritis, unspecified: Secondary | ICD-10-CM | POA: Diagnosis not present

## 2016-08-21 DIAGNOSIS — M797 Fibromyalgia: Secondary | ICD-10-CM | POA: Diagnosis not present

## 2016-08-21 DIAGNOSIS — G8929 Other chronic pain: Secondary | ICD-10-CM

## 2016-08-21 DIAGNOSIS — I10 Essential (primary) hypertension: Secondary | ICD-10-CM

## 2016-08-21 DIAGNOSIS — M329 Systemic lupus erythematosus, unspecified: Secondary | ICD-10-CM

## 2016-08-21 DIAGNOSIS — E119 Type 2 diabetes mellitus without complications: Secondary | ICD-10-CM | POA: Diagnosis not present

## 2016-08-21 DIAGNOSIS — F1011 Alcohol abuse, in remission: Secondary | ICD-10-CM | POA: Diagnosis not present

## 2016-08-21 DIAGNOSIS — I2699 Other pulmonary embolism without acute cor pulmonale: Secondary | ICD-10-CM

## 2016-08-21 DIAGNOSIS — E785 Hyperlipidemia, unspecified: Secondary | ICD-10-CM | POA: Diagnosis not present

## 2016-08-21 MED ORDER — DULOXETINE HCL 30 MG PO CPEP: 90.0000 mg | ORAL_CAPSULE | Freq: Every day | ORAL | 0 refills | Status: DC

## 2016-08-21 MED ORDER — ZIPRASIDONE HCL 60 MG PO CAPS: 60.0000 mg | ORAL_CAPSULE | Freq: Every day | ORAL | 1 refills | Status: DC

## 2016-08-21 NOTE — Patient Instructions (Signed)
1. Decrease Geodone 60 mg daily (take with dinner) 2. Increase duloxetine 90 mg daily 3. Continue ativan 1 mg twice a day as needed for anxiety 4. Contact for therapy: Dr. Daisy Blossom Schneidmiller  [phone] [address] [place], [place] [address] 5. Return to clinic in one month for 30 mins

## 2016-08-22 ENCOUNTER — Other Ambulatory Visit (HOSPITAL_COMMUNITY): Payer: Self-pay | Admitting: Neurology

## 2016-08-22 DIAGNOSIS — M545 Low back pain: Secondary | ICD-10-CM

## 2016-08-24 ENCOUNTER — Ambulatory Visit (INDEPENDENT_AMBULATORY_CARE_PROVIDER_SITE_OTHER): Payer: Self-pay

## 2016-08-24 ENCOUNTER — Ambulatory Visit (INDEPENDENT_AMBULATORY_CARE_PROVIDER_SITE_OTHER): Payer: Medicare Other | Admitting: Rheumatology

## 2016-08-24 ENCOUNTER — Ambulatory Visit: Payer: Medicare Other | Admitting: Rheumatology

## 2016-08-24 ENCOUNTER — Ambulatory Visit (INDEPENDENT_AMBULATORY_CARE_PROVIDER_SITE_OTHER): Payer: Medicare Other

## 2016-08-24 ENCOUNTER — Encounter: Payer: Self-pay | Admitting: Rheumatology

## 2016-08-24 VITALS — BP 130/94 | HR 82 | Resp 18 | Ht 67.0 in | Wt 256.0 lb

## 2016-08-24 DIAGNOSIS — M25561 Pain in right knee: Secondary | ICD-10-CM

## 2016-08-24 DIAGNOSIS — Z79899 Other long term (current) drug therapy: Secondary | ICD-10-CM

## 2016-08-24 DIAGNOSIS — M0579 Rheumatoid arthritis with rheumatoid factor of multiple sites without organ or systems involvement: Secondary | ICD-10-CM | POA: Diagnosis not present

## 2016-08-24 DIAGNOSIS — M25562 Pain in left knee: Secondary | ICD-10-CM

## 2016-08-24 DIAGNOSIS — G8929 Other chronic pain: Secondary | ICD-10-CM

## 2016-08-24 DIAGNOSIS — M533 Sacrococcygeal disorders, not elsewhere classified: Secondary | ICD-10-CM

## 2016-08-24 MED ORDER — LIDOCAINE HCL 2 % IJ SOLN
2.0000 mL | INTRAMUSCULAR | Status: AC | PRN
  Administered 2016-08-24: 2 mL

## 2016-08-24 MED ORDER — TRIAMCINOLONE ACETONIDE 40 MG/ML IJ SUSP
40.0000 mg | INTRAMUSCULAR | Status: AC | PRN
  Administered 2016-08-24: 40 mg via INTRA_ARTICULAR

## 2016-08-24 NOTE — Progress Notes (Signed)
Office Visit Note  Patient: Haley Goodman             Date of Birth: [birth year]           MRN: [id number]             PCP: Louie Boston., MD Referring: Ernestine Conrad, MD Visit Date: 08/24/2016 Occupation: @GUAROCC @    Subjective:  Pain of the Right Knee; Pain of the Left Knee; and Pain of the Lower Back   History of Present Illness: Haley Goodman is a 36 y.o. female  Last seen in our office approximately March 2018 for fibromyalgia and rheumatoid arthritis.  Patient's rheumatoid arthritis is well controlled with Plaquenil. Patient takes 200 mg twice a day every day. Adequate relief.  Patient also has fibromyalgia and that flares on her. At the last visit in March 2018, patient stated her pain was about 9 on a scale of 0-10.  At the last visit in March 2018, patient reports that she was having knee pain even during that visit. She mentioned it to Dr. Corliss Skains who offered her an injection but patient declined thinking that she would get better on her own. Unfortunately, it has gotten worse and it's not tolerable lately. Patient states that about 2-3 weeks ago, her pain became bad enough for her to call us today and make an appointment. Both knees are bothering her but the left one is slightly worse than the right one.    Activities of Daily Living:  Patient reports morning stiffness for 30 minutes.   Patient Reports nocturnal pain.  Difficulty dressing/grooming: Reports Difficulty climbing stairs: Reports Difficulty getting out of chair: Reports Difficulty using hands for taps, buttons, cutlery, and/or writing: Denies   Review of Systems  Constitutional: Positive for fatigue.  HENT: Negative for mouth sores and mouth dryness.   Eyes: Negative for dryness.  Respiratory: Negative for shortness of breath.   Gastrointestinal: Negative for constipation and diarrhea.  Musculoskeletal: Positive for myalgias and myalgias.  Skin: Negative for sensitivity to  sunlight.  Psychiatric/Behavioral: Positive for sleep disturbance. Negative for decreased concentration.    PMFS History:  Patient Active Problem List   Diagnosis Date Noted  . Rheumatoid arthritis involving multiple sites with positive rheumatoid factor (HCC) 05/10/2016  . Fibromyalgia 05/10/2016  . Primary osteoarthritis of both hands 05/10/2016  . Chronic pain syndrome/ in pain manaement 05/10/2016  . High risk medication use/ Plaquenil 05/10/2016  . Spondylosis of lumbar region without myelopathy or radiculopathy 05/10/2016  . Abnormal serum protein electrophoresis 05/10/2016  . Vitamin D deficiency 05/10/2016  . Uncontrolled type 2 diabetes mellitus with complication, without long-term current use of insulin (HCC) 03/13/2016  . Essential hypertension, benign 03/13/2016  . Morbid obesity (HCC) 03/13/2016  . Pulmonary embolism (HCC) 06/06/2011    Class: History of    Past Medical History:  Diagnosis Date  . Anemia   . Arthritis   . Fibromyalgia   . Hypertension   . Lupus   . PE (pulmonary embolism) 2009    Family History  Problem Relation Age of Onset  . Stroke Mother   . Cancer Other   . Diabetes Other   . Pseudochol deficiency Neg Hx   . Malignant hyperthermia Neg Hx   . Hypotension Neg Hx   . Anesthesia problems Neg Hx    Past Surgical History:  Procedure Laterality Date  . ABLATION    . CHOLECYSTECTOMY    . DILATION AND CURETTAGE OF UTERUS    .  TUBAL LIGATION     Social History   Social History Narrative  . No narrative on file     Objective: Vital Signs: BP (!) 130/94   Pulse 82   Resp 18   Ht 5\' 7"  (1.702 m)   Wt 256 lb (116.1 kg)   BMI 40.10 kg/m    Physical Exam  Constitutional: She is oriented to person, place, and time. She appears well-developed and well-nourished.  HENT:  Head: Normocephalic and atraumatic.  Eyes: EOM are normal. Pupils are equal, round, and reactive to light.  Cardiovascular: Normal rate, regular rhythm and normal  heart sounds.  Exam reveals no gallop and no friction rub.   No murmur heard. Pulmonary/Chest: Effort normal and breath sounds normal. She has no wheezes. She has no rales.  Abdominal: Soft. Bowel sounds are normal. She exhibits no distension. There is no tenderness. There is no guarding. No hernia.  Musculoskeletal: Normal range of motion. She exhibits no edema, tenderness or deformity.  Lymphadenopathy:    She has no cervical adenopathy.  Neurological: She is alert and oriented to person, place, and time. Coordination normal.  Skin: Skin is warm and dry. Capillary refill takes less than 2 seconds. No rash noted.  Psychiatric: She has a normal mood and affect. Her behavior is normal.  Nursing note and vitals reviewed.    Musculoskeletal Exam:  Full range of motion of all joints Grip strength is equal and strong bilaterally Fiber myalgia tender points are 6 out of 18 positive (the latter trapezius muscles, bilateral SI joint, bilateral greater trochanteric bursa  CDAI Exam: CDAI Homunculus Exam:   Joint Counts:  CDAI Tender Joint count: 0 CDAI Swollen Joint count: 0  Global Assessments:  Patient Global Assessment: 10 Provider Global Assessment: 10  CDAI Calculated Score: 20 No synovitis on examination. Patient's pain is coming from her knee discomfort. We discussed Visco as an option in the future   Investigation: No additional findings.   Imaging: No results found.  Speciality Comments: No specialty comments available.    Procedures:  Large Joint Inj Date/Time: 08/24/2016 2:02 PM Performed by: 08/26/2016 Authorized by: Tawni Pummel   Consent Given by:  Patient Site marked: the procedure site was marked   Timeout: prior to procedure the correct patient, procedure, and site was verified   Indications:  Pain and joint swelling Location:  Knee Site:  L knee Prep: patient was prepped and draped in usual sterile fashion   Needle Size:  27 G Needle Length:   1.5 inches Approach:  Medial Ultrasound Guidance: No   Fluoroscopic Guidance: No   Arthrogram: No   Medications:  2 mL lidocaine 2 %; 40 mg triamcinolone acetonide 40 MG/ML Aspiration Attempted: Yes   Patient tolerance:  Patient tolerated the procedure well with no immediate complications   Pain is described as 10 on a scale of 0-10 prior to the injection. 2 minutes after the injection, patient rates her discomfort as    Allergies: Codeine; Compazine; Ivp dye [iodinated diagnostic agents]; Nitrofurantoin monohyd macro; Penicillins; and Sulfasalazine   Assessment / Plan:     Visit Diagnoses: Acute pain of both knees - Plan: Large Joint Injection/Arthrocentesis, XR KNEE 3 VIEW LEFT, XR KNEE 3 VIEW RIGHT   Plan: #1: Rheumatoid arthritis No joint pain swelling and stiffness. Doing well with Plaquenil 200 mg twice a day every day. Plaquenil eye exam is normal and up-to-date January/February 2018 at my eye doctor and [facility]. Patient did have them  send [place] a copy.  #2: High risk prescription Adequate response with Plaquenil 20 mg twice a day  #3: Bilateral knee joint pain. Left is worse than right. Please see procedure notes for full details. Patient's pain was 10 on a scale of 0-10 prior to the injection. After informed consent was obtained, the site was prepped in usual fashion injected with 40 mg of Kenalog mixed with 2 mL's of 2% lidocaine without epinephrine. Patient tolerated procedure well. Patient's pain was 2 on a scale of 0-10 after the injection  #4: Bilateral SI joint pain. Patient has a history of diabetes and hypertension. Because of that, we will not give cortisone injection in the SI joints. We will only be limited to the left knee. Patient can make an appointment for follow-up for SI joint injection to one site in the future if she does not get any relief with today's cortisone injection.  #5 she has an appointment 10/17/2016 for follow-up. She can wait  until then to get the injection or she can make an appointment earlier.  Orders: Orders Placed This Encounter  Procedures  . Large Joint Injection/Arthrocentesis  . XR KNEE 3 VIEW LEFT  . XR KNEE 3 VIEW RIGHT   No orders of the defined types were placed in this encounter.   Face-to-face time spent with patient was 30 minutes. 50% of time was spent in counseling and coordination of care.  Follow-Up Instructions: Return as scheduled aug 2018, for RA,, FMS, FATIGUE,INSOMNIA, oakj, bil kj pain.   Tawni Pummel, PA-C I examined and evaluated the patient with Tawni Pummel PA.She had no warmth or swelling on her knee exam. She had good response to the cortisone injection. The plan of care was discussed as noted above.  Pollyann Savoy, MD Note - This record has been created using Animal nutritionist.  Chart creation errors have been sought, but may not always  have been located. Such creation errors do not reflect on  the standard of medical care.

## 2016-09-04 ENCOUNTER — Telehealth: Payer: Self-pay | Admitting: Rheumatology

## 2016-09-04 DIAGNOSIS — Z79899 Other long term (current) drug therapy: Secondary | ICD-10-CM | POA: Diagnosis not present

## 2016-09-04 DIAGNOSIS — Z5181 Encounter for therapeutic drug level monitoring: Secondary | ICD-10-CM | POA: Diagnosis not present

## 2016-09-04 DIAGNOSIS — E559 Vitamin D deficiency, unspecified: Secondary | ICD-10-CM | POA: Diagnosis not present

## 2016-09-04 NOTE — Telephone Encounter (Signed)
Tawni Pummel, PA-C  Henriette Combs, LPN        Please inform patient that her bilateral knee x-ray shows severe osteoarthritis of bilateral knees.  I would recommend moving forward with Visco supplementation.  She will have to let us know if that's what she wants [place] to do and when she does do that we will apply.  It'll be up to the insurance company which Visco they approve.   If she needs cortisone injection in the opposite knee, she can make an appointment.  It is best to avoid cortisone injection if she can tolerate the pain.

## 2016-09-04 NOTE — Telephone Encounter (Signed)
Patient called stating that she is ready to proceed with the injections for her knees.

## 2016-09-04 NOTE — Telephone Encounter (Signed)
Attempted to contact the patient and left message for patient to call the office,  

## 2016-09-05 NOTE — Telephone Encounter (Signed)
Patient advised of x-ray results. Patient would like to go ahead with visco supplementation. Please apply for visco supplementation

## 2016-09-05 NOTE — Telephone Encounter (Signed)
Patient returned your call.

## 2016-09-06 DIAGNOSIS — E669 Obesity, unspecified: Secondary | ICD-10-CM | POA: Diagnosis not present

## 2016-09-06 DIAGNOSIS — R51 Headache: Secondary | ICD-10-CM | POA: Diagnosis not present

## 2016-09-06 DIAGNOSIS — K219 Gastro-esophageal reflux disease without esophagitis: Secondary | ICD-10-CM | POA: Diagnosis not present

## 2016-09-06 DIAGNOSIS — Z7901 Long term (current) use of anticoagulants: Secondary | ICD-10-CM | POA: Diagnosis not present

## 2016-09-06 DIAGNOSIS — Z79899 Other long term (current) drug therapy: Secondary | ICD-10-CM | POA: Diagnosis not present

## 2016-09-06 DIAGNOSIS — E119 Type 2 diabetes mellitus without complications: Secondary | ICD-10-CM | POA: Diagnosis not present

## 2016-09-06 DIAGNOSIS — I10 Essential (primary) hypertension: Secondary | ICD-10-CM | POA: Diagnosis not present

## 2016-09-06 DIAGNOSIS — G43909 Migraine, unspecified, not intractable, without status migrainosus: Secondary | ICD-10-CM | POA: Diagnosis not present

## 2016-09-06 DIAGNOSIS — R079 Chest pain, unspecified: Secondary | ICD-10-CM | POA: Diagnosis not present

## 2016-09-06 DIAGNOSIS — M797 Fibromyalgia: Secondary | ICD-10-CM | POA: Diagnosis not present

## 2016-09-06 DIAGNOSIS — Z794 Long term (current) use of insulin: Secondary | ICD-10-CM | POA: Diagnosis not present

## 2016-09-06 DIAGNOSIS — M329 Systemic lupus erythematosus, unspecified: Secondary | ICD-10-CM | POA: Diagnosis not present

## 2016-09-06 DIAGNOSIS — M069 Rheumatoid arthritis, unspecified: Secondary | ICD-10-CM | POA: Diagnosis not present
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
## 2016-09-20 ENCOUNTER — Ambulatory Visit (HOSPITAL_COMMUNITY): Payer: Medicare Other | Admitting: Psychiatry

## 2016-09-20 ENCOUNTER — Encounter (HOSPITAL_COMMUNITY): Payer: Self-pay

## 2016-09-27 ENCOUNTER — Telehealth (HOSPITAL_COMMUNITY): Payer: Self-pay | Admitting: *Deleted

## 2016-09-27 NOTE — Telephone Encounter (Signed)
Please advice the patient to make a follow up appointment.

## 2016-09-27 NOTE — Telephone Encounter (Signed)
Voice message from patient.  she need refill on her medications.

## 2016-09-28 ENCOUNTER — Telehealth (HOSPITAL_COMMUNITY): Payer: Self-pay | Admitting: *Deleted

## 2016-09-28 NOTE — Telephone Encounter (Signed)
Spoke with pt due to previous calls. Per pt she need refills for her Cymbalta. Per pt message will be sent to provider and pt verbalized understanding.

## 2016-09-28 NOTE — Telephone Encounter (Signed)
Called pt number on file and message came up stating number dialed is not valid [phone]).

## 2016-09-28 NOTE — Telephone Encounter (Signed)
Pt called requesting refills for her Cymbalta. Per pt she is out of refills. Per pt chart, pt has only been seen once which was the same day her was given refills for her Cymbalta on 08-21-2016. Pt did resch f/u appt for 10-11-2016. Pt number is [phone].

## 2016-09-28 NOTE — Telephone Encounter (Signed)
Called pt at [phone] and a mail picked up stating pt is not available. Asked for males name and he stated Haley Goodman her husband. Asked Mr. Haley Goodman if he could please have pt call office to sch f/u appt. Office location name was not provided to Mr. Haley Goodman but office number was provided and he stated he will have pt call.

## 2016-09-29 ENCOUNTER — Other Ambulatory Visit (HOSPITAL_COMMUNITY): Payer: Self-pay | Admitting: Psychiatry

## 2016-09-29 MED ORDER — DULOXETINE HCL 30 MG PO CPEP: 90.0000 mg | ORAL_CAPSULE | Freq: Every day | ORAL | 0 refills | Status: DC

## 2016-09-29 NOTE — Telephone Encounter (Signed)
Done

## 2016-10-02 NOTE — Telephone Encounter (Signed)
noted 

## 2016-10-03 DIAGNOSIS — M329 Systemic lupus erythematosus, unspecified: Secondary | ICD-10-CM | POA: Diagnosis not present

## 2016-10-03 DIAGNOSIS — T783XXA Angioneurotic edema, initial encounter: Secondary | ICD-10-CM | POA: Diagnosis not present

## 2016-10-03 DIAGNOSIS — E119 Type 2 diabetes mellitus without complications: Secondary | ICD-10-CM | POA: Diagnosis not present

## 2016-10-03 DIAGNOSIS — Z9049 Acquired absence of other specified parts of digestive tract: Secondary | ICD-10-CM | POA: Diagnosis not present

## 2016-10-03 DIAGNOSIS — Z888 Allergy status to other drugs, medicaments and biological substances status: Secondary | ICD-10-CM | POA: Diagnosis not present

## 2016-10-03 DIAGNOSIS — R1013 Epigastric pain: Secondary | ICD-10-CM | POA: Diagnosis not present

## 2016-10-03 DIAGNOSIS — Z882 Allergy status to sulfonamides status: Secondary | ICD-10-CM | POA: Diagnosis not present

## 2016-10-03 DIAGNOSIS — M1990 Unspecified osteoarthritis, unspecified site: Secondary | ICD-10-CM | POA: Diagnosis not present

## 2016-10-03 DIAGNOSIS — I10 Essential (primary) hypertension: Secondary | ICD-10-CM | POA: Diagnosis not present

## 2016-10-03 DIAGNOSIS — M797 Fibromyalgia: Secondary | ICD-10-CM | POA: Diagnosis not present

## 2016-10-03 DIAGNOSIS — Z880 Allergy status to penicillin: Secondary | ICD-10-CM | POA: Diagnosis not present

## 2016-10-04 ENCOUNTER — Telehealth (HOSPITAL_COMMUNITY): Payer: Self-pay | Admitting: *Deleted

## 2016-10-04 NOTE — Telephone Encounter (Signed)
Received prior authorization for Duloxetine. Called pharmacy and was told that it was filled on 10/03/16 and no longer needed.

## 2016-10-05 NOTE — Telephone Encounter (Signed)
noted 
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
## 2016-10-11 ENCOUNTER — Ambulatory Visit (HOSPITAL_COMMUNITY): Payer: Self-pay | Admitting: Psychiatry

## 2016-10-12 DIAGNOSIS — M545 Low back pain: Secondary | ICD-10-CM | POA: Diagnosis not present

## 2016-10-12 DIAGNOSIS — M069 Rheumatoid arthritis, unspecified: Secondary | ICD-10-CM | POA: Diagnosis not present

## 2016-10-12 DIAGNOSIS — F329 Major depressive disorder, single episode, unspecified: Secondary | ICD-10-CM | POA: Diagnosis not present

## 2016-10-12 DIAGNOSIS — Z79899 Other long term (current) drug therapy: Secondary | ICD-10-CM | POA: Diagnosis not present

## 2016-10-12 DIAGNOSIS — M2550 Pain in unspecified joint: Secondary | ICD-10-CM | POA: Diagnosis not present

## 2016-10-12 DIAGNOSIS — Z79891 Long term (current) use of opiate analgesic: Secondary | ICD-10-CM | POA: Diagnosis not present

## 2016-10-12 DIAGNOSIS — M3214 Glomerular disease in systemic lupus erythematosus: Secondary | ICD-10-CM | POA: Diagnosis not present

## 2016-10-12 DIAGNOSIS — E119 Type 2 diabetes mellitus without complications: Secondary | ICD-10-CM | POA: Diagnosis not present

## 2016-10-12 DIAGNOSIS — K219 Gastro-esophageal reflux disease without esophagitis: Secondary | ICD-10-CM | POA: Diagnosis not present

## 2016-10-12 DIAGNOSIS — G894 Chronic pain syndrome: Secondary | ICD-10-CM | POA: Diagnosis not present

## 2016-10-12 DIAGNOSIS — M797 Fibromyalgia: Secondary | ICD-10-CM | POA: Diagnosis not present

## 2016-10-12 DIAGNOSIS — R0789 Other chest pain: Secondary | ICD-10-CM | POA: Diagnosis not present
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
## 2016-10-17 ENCOUNTER — Ambulatory Visit: Payer: Medicare Other | Admitting: Rheumatology

## 2016-10-18 ENCOUNTER — Ambulatory Visit (INDEPENDENT_AMBULATORY_CARE_PROVIDER_SITE_OTHER): Payer: Medicare Other | Admitting: Psychiatry

## 2016-10-18 ENCOUNTER — Encounter (HOSPITAL_COMMUNITY): Payer: Self-pay | Admitting: Psychiatry

## 2016-10-18 VITALS — BP 127/92 | HR 96 | Ht 67.0 in | Wt 249.4 lb

## 2016-10-18 DIAGNOSIS — F331 Major depressive disorder, recurrent, moderate: Secondary | ICD-10-CM | POA: Diagnosis not present

## 2016-10-18 DIAGNOSIS — R400 Somnolence: Secondary | ICD-10-CM

## 2016-10-18 DIAGNOSIS — I10 Essential (primary) hypertension: Secondary | ICD-10-CM | POA: Diagnosis not present

## 2016-10-18 DIAGNOSIS — M791 Myalgia: Secondary | ICD-10-CM

## 2016-10-18 DIAGNOSIS — E119 Type 2 diabetes mellitus without complications: Secondary | ICD-10-CM

## 2016-10-18 DIAGNOSIS — F1021 Alcohol dependence, in remission: Secondary | ICD-10-CM

## 2016-10-18 DIAGNOSIS — F419 Anxiety disorder, unspecified: Secondary | ICD-10-CM

## 2016-10-18 DIAGNOSIS — R5383 Other fatigue: Secondary | ICD-10-CM | POA: Diagnosis not present

## 2016-10-18 MED ORDER — LORAZEPAM 1 MG PO TABS: ORAL_TABLET | ORAL | 1 refills | Status: DC

## 2016-10-18 MED ORDER — DULOXETINE HCL 60 MG PO CPEP: 120.0000 mg | ORAL_CAPSULE | Freq: Every day | ORAL | 1 refills | Status: DC

## 2016-10-18 MED ORDER — ZIPRASIDONE HCL 20 MG PO CAPS: 20.0000 mg | ORAL_CAPSULE | Freq: Every day | ORAL | 1 refills | Status: DC

## 2016-10-18 NOTE — Patient Instructions (Addendum)
1. Decrease geodon 20 mg daily 2. Increase duloxetine 120 mg daily 3. Continue ativan 1 mg twice a day as needed for axniety 4. Referral to therapy 5. Return to clinic in one month for 30 mins 6. Get record from pain specialist

## 2016-10-19 ENCOUNTER — Other Ambulatory Visit: Payer: Self-pay | Admitting: Rheumatology

## 2016-10-19 NOTE — Telephone Encounter (Signed)
Last Visit: 11/24/16 Next Visit: 11/16/16 Labs: 03/07/2016 Elevated glucose PLQ Eye Exam: 02/2016 WNL  Left message to advise patient she is due to update labs  Okay to refill 30 day supply PLQ?

## 2016-10-19 NOTE — Telephone Encounter (Signed)
ok 

## 2016-10-20 ENCOUNTER — Ambulatory Visit (HOSPITAL_COMMUNITY)
Admission: RE | Admit: 2016-10-20 | Discharge: 2016-10-20 | Disposition: A | Discharge status: Home / Self Care | Payer: Medicare Other | Source: Ambulatory Visit | Attending: Neurology | Admitting: Neurology

## 2016-10-20 DIAGNOSIS — M545 Low back pain: Secondary | ICD-10-CM

## 2016-10-23 ENCOUNTER — Telehealth (HOSPITAL_COMMUNITY): Payer: Self-pay

## 2016-10-23 ENCOUNTER — Other Ambulatory Visit (HOSPITAL_COMMUNITY): Payer: Self-pay | Admitting: Psychiatry

## 2016-10-23 MED ORDER — DULOXETINE HCL 30 MG PO CPEP: 30.0000 mg | ORAL_CAPSULE | Freq: Every day | ORAL | 2 refills | Status: DC

## 2016-10-23 MED ORDER — ZIPRASIDONE HCL 40 MG PO CAPS: 40.0000 mg | ORAL_CAPSULE | Freq: Every day | ORAL | 0 refills | Status: DC

## 2016-10-23 NOTE — Telephone Encounter (Signed)
Medication problem - Pt. called stating she tried to taper down on Geodon to 20 mg from 60 mg and increase Cymbalta to 120 mg daily from 90 mg per Dr. Bing Matter instructions 10/18/16 but feels shaky and does not want to stay at these dosages. Patient reported she has started taking Geodon 40 mg (2 of the 20 mg) but will need a new prescription for this since will run out early and has returned to taking 90 mg total of Cymbalta again and will need a 30 mg order for this to go along with her 60 mg dosage.  States she thinks she will need to taper back on Geodon slower and requests new orders. Informed Dr. Vanetta Shawl would be out until 11/02/16 but patient stated she would be out of both and unable to refill current prescription before then.  Patient requested this be sent to covering provider and agreed to send to Dr. Tenny Craw with request.  Patient to call back if has not heard back by 10/24/16 pm.

## 2016-10-23 NOTE — Progress Notes (Unsigned)
Cymbalta 

## 2016-10-23 NOTE — Telephone Encounter (Signed)
Cymbalta 30 mg and Geodon 40 mg sent in. Tell her to stay on these dosages until dr. Vanetta Shawl returns

## 2016-10-23 NOTE — Telephone Encounter (Signed)
Medication management - Telephone message left for patient that Dr. Tenny Craw had send in her newly requested orders with dosages she has requested and informed Dr. Tenny Craw wanted her to stay on these dosages until she returns to see Dr. Vanetta Shawl 11/15/16.

## 2016-10-24 ENCOUNTER — Telehealth: Payer: Self-pay | Admitting: Radiology

## 2016-10-24 ENCOUNTER — Ambulatory Visit: Payer: Medicare Other | Admitting: "Endocrinology

## 2016-10-24 NOTE — Telephone Encounter (Signed)
Sent labs from September 04 2016 to be scanned there is a note from Dr Corliss Skains to notify patient the labs show elevated glucose, 155, please call her

## 2016-10-24 NOTE — Telephone Encounter (Signed)
noted 

## 2016-10-25 ENCOUNTER — Ambulatory Visit (HOSPITAL_COMMUNITY)
Admission: RE | Admit: 2016-10-25 | Discharge: 2016-10-25 | Disposition: A | Discharge status: Home / Self Care | Payer: Medicare Other | Source: Ambulatory Visit | Attending: Neurology | Admitting: Neurology

## 2016-10-25 DIAGNOSIS — M48061 Spinal stenosis, lumbar region without neurogenic claudication: Secondary | ICD-10-CM | POA: Diagnosis not present

## 2016-10-25 DIAGNOSIS — M545 Low back pain: Secondary | ICD-10-CM | POA: Diagnosis not present

## 2016-10-25 DIAGNOSIS — M47896 Other spondylosis, lumbar region: Secondary | ICD-10-CM | POA: Insufficient documentation

## 2016-10-25 DIAGNOSIS — M5126 Other intervertebral disc displacement, lumbar region: Secondary | ICD-10-CM | POA: Diagnosis not present

## 2016-10-25 NOTE — Telephone Encounter (Signed)
Left message to advised patient of lab results.

## 2016-10-26 NOTE — Telephone Encounter (Signed)
In progress

## 2016-10-27 DIAGNOSIS — E669 Obesity, unspecified: Secondary | ICD-10-CM | POA: Diagnosis not present

## 2016-10-27 DIAGNOSIS — E785 Hyperlipidemia, unspecified: Secondary | ICD-10-CM | POA: Diagnosis not present

## 2016-10-27 DIAGNOSIS — I10 Essential (primary) hypertension: Secondary | ICD-10-CM | POA: Diagnosis not present

## 2016-10-27 DIAGNOSIS — F339 Major depressive disorder, recurrent, unspecified: Secondary | ICD-10-CM | POA: Diagnosis not present

## 2016-10-27 DIAGNOSIS — E119 Type 2 diabetes mellitus without complications: Secondary | ICD-10-CM | POA: Diagnosis not present

## 2016-10-27 DIAGNOSIS — I2782 Chronic pulmonary embolism: Secondary | ICD-10-CM | POA: Diagnosis not present

## 2016-10-27 DIAGNOSIS — M069 Rheumatoid arthritis, unspecified: Secondary | ICD-10-CM | POA: Diagnosis not present

## 2016-10-27 DIAGNOSIS — E1165 Type 2 diabetes mellitus with hyperglycemia: Secondary | ICD-10-CM | POA: Diagnosis not present

## 2016-11-06 ENCOUNTER — Ambulatory Visit (HOSPITAL_COMMUNITY): Payer: Medicare Other | Admitting: Licensed Clinical Social Worker

## 2016-11-08 NOTE — Progress Notes (Deleted)
Office Visit Note  Patient: Haley Goodman             Date of Birth: Apr 17, 1980           MRN: [id number]             PCP: Louie Boston., MD Referring: Ernestine Conrad, MD Visit Date: 11/16/2016 Occupation: @GUAROCC @    Subjective:  No chief complaint on file.   History of Present Illness: Haley Goodman is a 36 y.o. female ***   Activities of Daily Living:  Patient reports morning stiffness for *** {minute/hour:19697}.   Patient {ACTIONS;DENIES/REPORTS:21021675::"Denies"} nocturnal pain.  Difficulty dressing/grooming: {ACTIONS;DENIES/REPORTS:21021675::"Denies"} Difficulty climbing stairs: {ACTIONS;DENIES/REPORTS:21021675::"Denies"} Difficulty getting out of chair: {ACTIONS;DENIES/REPORTS:21021675::"Denies"} Difficulty using hands for taps, buttons, cutlery, and/or writing: {ACTIONS;DENIES/REPORTS:21021675::"Denies"}   No Rheumatology ROS completed.   PMFS History:  Patient Active Problem List   Diagnosis Date Noted  . Rheumatoid arthritis involving multiple sites with positive rheumatoid factor (HCC) 05/10/2016  . Fibromyalgia 05/10/2016  . Primary osteoarthritis of both hands 05/10/2016  . Chronic pain syndrome/ in pain manaement 05/10/2016  . High risk medication use/ Plaquenil 05/10/2016  . Spondylosis of lumbar region without myelopathy or radiculopathy 05/10/2016  . Abnormal serum protein electrophoresis 05/10/2016  . Vitamin D deficiency 05/10/2016  . Uncontrolled type 2 diabetes mellitus with complication, without long-term current use of insulin (HCC) 03/13/2016  . Essential hypertension, benign 03/13/2016  . Morbid obesity (HCC) 03/13/2016  . Pulmonary embolism (HCC) 06/06/2011    Class: History of    Past Medical History:  Diagnosis Date  . Anemia   . Arthritis   . Fibromyalgia   . Hypertension   . Lupus   . PE (pulmonary embolism) 2009    Family History  Problem Relation Age of Onset  . Stroke Mother   . Cancer Other   . Diabetes  Other   . Pseudochol deficiency Neg Hx   . Malignant hyperthermia Neg Hx   . Hypotension Neg Hx   . Anesthesia problems Neg Hx    Past Surgical History:  Procedure Laterality Date  . ABLATION    . CHOLECYSTECTOMY    . DILATION AND CURETTAGE OF UTERUS    . TUBAL LIGATION     Social History   Social History Narrative  . No narrative on file     Objective: Vital Signs: There were no vitals taken for this visit.   Physical Exam   Musculoskeletal Exam: ***  CDAI Exam: No CDAI exam completed.    Investigation: No additional findings.PLQ eye exam? CBC Latest Ref Rng & Units 02/17/2015 11/02/2013 11/02/2013  WBC 4.0 - 10.5 K/uL 6.1 - 9.9  Hemoglobin 12.0 - 15.0 g/dL 11.0(L) 14.3 11.8(L)  Hematocrit 36.0 - 46.0 % 34.6(L) 42.0 36.9  Platelets 150 - 400 K/uL 235 - 242   CMP Latest Ref Rng & Units 02/17/2015 11/02/2013 08/25/2013  Glucose 65 - 99 mg/dL 08/27/2013) 924(Q) 683(M)  BUN 6 - 20 mg/dL 12 10 8   Creatinine 0.44 - 1.00 mg/dL 196(Q) 2.29(N  Sodium 135 - 145 mmol/L 135 135(L) 137  Potassium 3.5 - 5.1 mmol/L 4.0 4.0 4.1  Chloride 101 - 111 mmol/L 108 101 99  CO2 22 - 32 mmol/L 20(L) - 27  Calcium 8.9 - 10.3 mg/dL 9.89) - 8.6  Total Protein 6.0 - 8.3 g/dL - - -  Total Bilirubin 0.3 - 1.2 mg/dL - - -  Alkaline Phos 39 - 117 U/L - - -  AST 0 - 37 U/L - - -  ALT 0 - 35 U/L - - -    Imaging: Mr Lumbar Spine Wo Contrast  Result Date: 10/25/2016 CLINICAL DATA:  36 y/o F; lower back pain radiating down the left leg. EXAM: MRI LUMBAR SPINE WITHOUT CONTRAST TECHNIQUE: Multiplanar, multisequence MR imaging of the lumbar spine was performed. No intravenous contrast was administered. COMPARISON:  None. FINDINGS: Segmentation:  Standard. Alignment:  Physiologic. Vertebrae: No fracture, evidence of discitis, or bone lesion. 12 mm T1/T2 hyperintense focus at L2 vertebral body lab with hemangioma. Conus medullaris: Extends to the L1-2 level and appears normal. Paraspinal and other soft  tissues: Negative. Disc levels: L1-2: No significant disc displacement, foraminal narrowing, or canal stenosis. L2-3: No significant disc displacement, foraminal narrowing, or canal stenosis. L3-4: No significant disc displacement, foraminal narrowing, or canal stenosis. L4-5: Small disc bulge with mild facet hypertrophy. Mild disc space narrowing. No significant foraminal or canal stenosis. L5-S1: No significant disc displacement. Mild facet hypertrophy. No significant foraminal or canal stenosis. IMPRESSION: 1. No acute osseous abnormality. 2. Mild lumbar spondylosis greatest at the L4-5 level with there is mild disc space narrowing and a disc bulge. 3. No significant foraminal or canal stenosis. Electronically Signed   By: Mitzi Hansen M.D.   On: 10/25/2016 16:03    Speciality Comments: No specialty comments available.    Procedures:  No procedures performed Allergies: Codeine; Compazine; Ivp dye [iodinated diagnostic agents]; Nitrofurantoin monohyd macro; Penicillins; and Sulfasalazine   Assessment / Plan:     Visit Diagnoses: No diagnosis found.    Orders: No orders of the defined types were placed in this encounter.  No orders of the defined types were placed in this encounter.   Face-to-face time spent with patient was *** minutes. 50% of time was spent in counseling and coordination of care.  Follow-Up Instructions: No Follow-up on file.   Ellen Henri, NT  Note - This record has been created using [employer] nutritionist.  Chart creation errors have been sought, but may not always  have been located. Such creation errors do not reflect on  the standard of medical care.
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
## 2016-11-14 ENCOUNTER — Ambulatory Visit: Payer: Medicare Other | Admitting: "Endocrinology

## 2016-11-15 ENCOUNTER — Telehealth (HOSPITAL_COMMUNITY): Payer: Self-pay | Admitting: Psychiatry

## 2016-11-15 ENCOUNTER — Ambulatory Visit (INDEPENDENT_AMBULATORY_CARE_PROVIDER_SITE_OTHER): Payer: Medicare Other | Admitting: Psychiatry

## 2016-11-15 ENCOUNTER — Encounter (HOSPITAL_COMMUNITY): Payer: Self-pay | Admitting: Psychiatry

## 2016-11-15 VITALS — BP 124/82 | HR 83 | Ht 67.0 in | Wt 255.4 lb

## 2016-11-15 DIAGNOSIS — Z62810 Personal history of physical and sexual abuse in childhood: Secondary | ICD-10-CM

## 2016-11-15 DIAGNOSIS — F419 Anxiety disorder, unspecified: Secondary | ICD-10-CM

## 2016-11-15 DIAGNOSIS — F331 Major depressive disorder, recurrent, moderate: Secondary | ICD-10-CM

## 2016-11-15 DIAGNOSIS — F1021 Alcohol dependence, in remission: Secondary | ICD-10-CM

## 2016-11-15 DIAGNOSIS — I10 Essential (primary) hypertension: Secondary | ICD-10-CM | POA: Diagnosis not present

## 2016-11-15 DIAGNOSIS — Z79899 Other long term (current) drug therapy: Secondary | ICD-10-CM | POA: Diagnosis not present

## 2016-11-15 DIAGNOSIS — M797 Fibromyalgia: Secondary | ICD-10-CM

## 2016-11-15 DIAGNOSIS — Z7901 Long term (current) use of anticoagulants: Secondary | ICD-10-CM

## 2016-11-15 DIAGNOSIS — G8929 Other chronic pain: Secondary | ICD-10-CM | POA: Diagnosis not present

## 2016-11-15 DIAGNOSIS — G4700 Insomnia, unspecified: Secondary | ICD-10-CM | POA: Diagnosis not present

## 2016-11-15 DIAGNOSIS — E119 Type 2 diabetes mellitus without complications: Secondary | ICD-10-CM

## 2016-11-15 MED ORDER — DULOXETINE HCL 30 MG PO CPEP: ORAL_CAPSULE | ORAL | 1 refills | Status: DC

## 2016-11-15 MED ORDER — DULOXETINE HCL 60 MG PO CPEP
ORAL_CAPSULE | ORAL | 1 refills | Status: DC
Start: 2016-11-15 — End: 2016-12-07

## 2016-11-15 MED ORDER — ZIPRASIDONE HCL 40 MG PO CAPS: 40.0000 mg | ORAL_CAPSULE | Freq: Every day | ORAL | 1 refills | Status: DC

## 2016-11-15 MED ORDER — LORAZEPAM 1 MG PO TABS: 1.0000 mg | ORAL_TABLET | Freq: Two times a day (BID) | ORAL | 0 refills | Status: DC | PRN

## 2016-11-15 NOTE — Patient Instructions (Addendum)
1. Continue duloxetine 90 mg (60 mg +30 mg) daily 2. Continue Geodon 40 mg daily 3. Continue ativan 0.5-1 mg a day as needed for anxiety 4. Referral to therapy 5. Return to clinic in one month for 30 mins 6. Obtain record from pain specialist

## 2016-11-16 ENCOUNTER — Ambulatory Visit: Payer: Medicare Other | Admitting: Rheumatology

## 2016-11-27 ENCOUNTER — Telehealth: Payer: Self-pay | Admitting: Rheumatology

## 2016-11-27 DIAGNOSIS — Z79899 Other long term (current) drug therapy: Secondary | ICD-10-CM

## 2016-11-27 NOTE — Telephone Encounter (Signed)
Left message for patient to call the office

## 2016-11-27 NOTE — Telephone Encounter (Signed)
Patient will call back with fax number. Then labs will be released and faxed.

## 2016-11-27 NOTE — Telephone Encounter (Signed)
Patient going to [facility] for labs this pm. Please send orders.

## 2016-11-28 NOTE — Telephone Encounter (Signed)
labs released and faxed.

## 2016-11-28 NOTE — Addendum Note (Signed)
Addended by: Henriette Combs on: 11/28/2016 03:36 PM   Modules accepted: Orders

## 2016-11-28 NOTE — Telephone Encounter (Signed)
Patient called back with fax # to admissions 10/1. [phone]

## 2016-11-30 ENCOUNTER — Ambulatory Visit (INDEPENDENT_AMBULATORY_CARE_PROVIDER_SITE_OTHER): Payer: Medicare Other | Admitting: Rheumatology

## 2016-11-30 ENCOUNTER — Encounter: Payer: Self-pay | Admitting: Rheumatology

## 2016-11-30 VITALS — BP 140/86 | HR 80 | Resp 16 | Ht 67.0 in | Wt 260.0 lb

## 2016-11-30 DIAGNOSIS — M461 Sacroiliitis, not elsewhere classified: Secondary | ICD-10-CM | POA: Diagnosis not present

## 2016-11-30 DIAGNOSIS — M0579 Rheumatoid arthritis with rheumatoid factor of multiple sites without organ or systems involvement: Secondary | ICD-10-CM

## 2016-11-30 DIAGNOSIS — M797 Fibromyalgia: Secondary | ICD-10-CM

## 2016-11-30 DIAGNOSIS — Z79899 Other long term (current) drug therapy: Secondary | ICD-10-CM | POA: Diagnosis not present

## 2016-11-30 DIAGNOSIS — M62838 Other muscle spasm: Secondary | ICD-10-CM | POA: Diagnosis not present

## 2016-11-30 LAB — COMPLETE METABOLIC PANEL WITH GFR
AG RATIO: 1.1 (calc) (ref 1.0–2.5)
ALT: 18 U/L (ref 6–29)
AST: 16 U/L (ref 10–30)
Albumin: 4 g/dL (ref 3.6–5.1)
Alkaline phosphatase (APISO): 63 U/L (ref 33–115)
BILIRUBIN TOTAL: 0.3 mg/dL (ref 0.2–1.2)
BUN: 8 mg/dL (ref 7–25)
CALCIUM: 8.8 mg/dL (ref 8.6–10.2)
CHLORIDE: 99 mmol/L (ref 98–110)
CO2: 29 mmol/L (ref 20–32)
Creat: 0.84 mg/dL (ref 0.50–1.10)
GFR, EST AFRICAN AMERICAN: 102 mL/min/{1.73_m2} (ref >=60)
GFR, EST NON AFRICAN AMERICAN: 88 mL/min/{1.73_m2} (ref >=60)
GLOBULIN: 3.5 g/dL (ref 1.9–3.7)
Glucose, Bld: 147 mg/dL — ABNORMAL HIGH (ref 65–99)
POTASSIUM: 4.6 mmol/L (ref 3.5–5.3)
SODIUM: 134 mmol/L — AB (ref 135–146)
TOTAL PROTEIN: 7.5 g/dL (ref 6.1–8.1)

## 2016-11-30 LAB — CBC WITH DIFFERENTIAL/PLATELET
BASOS ABS: 53 {cells}/uL (ref 0–200)
Basophils Relative: 0.6 %
EOS ABS: 466 {cells}/uL (ref 15–500)
EOS PCT: 5.3 %
HCT: 36.1 % (ref 35.0–45.0)
Hemoglobin: 12 g/dL (ref 11.7–15.5)
Lymphs Abs: 3476 cells/uL (ref 850–3900)
MCH: 26.5 pg — AB (ref 27.0–33.0)
MCHC: 33.2 g/dL (ref 32.0–36.0)
MCV: 79.7 fL — ABNORMAL LOW (ref 80.0–100.0)
MONOS PCT: 7 %
MPV: 10.7 fL (ref 7.5–12.5)
NEUTROS ABS: 4189 {cells}/uL (ref 1500–7800)
Neutrophils Relative %: 47.6 %
PLATELETS: 247 10*3/uL (ref 140–400)
RBC: 4.53 10*6/uL (ref 3.80–5.10)
RDW: 13.8 % (ref 11.0–15.0)
TOTAL LYMPHOCYTE: 39.5 %
WBC mixed population: 616 cells/uL (ref 200–950)
WBC: 8.8 10*3/uL (ref 3.8–10.8)

## 2016-11-30 MED ORDER — LIDOCAINE HCL 1 % IJ SOLN
1.5000 mL | INTRAMUSCULAR | Status: AC | PRN
  Administered 2016-11-30: 1.5 mL

## 2016-11-30 MED ORDER — LIDOCAINE HCL 1 % IJ SOLN
1.0000 mL | INTRAMUSCULAR | Status: AC | PRN
  Administered 2016-11-30: 1 mL

## 2016-11-30 MED ORDER — LIDOCAINE HCL 1 % IJ SOLN
0.5000 mL | INTRAMUSCULAR | Status: AC | PRN
  Administered 2016-11-30: .5 mL

## 2016-11-30 MED ORDER — TRIAMCINOLONE ACETONIDE 40 MG/ML IJ SUSP
40.0000 mg | INTRAMUSCULAR | Status: AC | PRN
  Administered 2016-11-30: 40 mg via INTRA_ARTICULAR

## 2016-11-30 MED ORDER — TRIAMCINOLONE ACETONIDE 40 MG/ML IJ SUSP
10.0000 mg | INTRAMUSCULAR | Status: AC | PRN
  Administered 2016-11-30: 10 mg via INTRAMUSCULAR

## 2016-11-30 NOTE — Progress Notes (Signed)
Office Visit Note  Patient: Haley Goodman             Date of Birth: [birth year]           MRN: [id number]             PCP: Avon Gully, MD Referring: Ernestine Conrad, MD Visit Date: 11/30/2016 Occupation: @GUAROCC @    Subjective:  Pain of the Lower Back; Pain of the Right Knee; and Pain of the Left Knee   History of Present Illness: Haley Goodman is a 36 y.o. female  Hx of sero-positive RA.n fibromyalgia and high risk prescriptPlaquenil 200 mg twice a day Monday through Friday)  C/o trap muscle pain "7" on 0-10 scale And bil si jt pain "10"   Activities of Daily Living:  Patient reports morning stiffness for 30 minutes.   Patient Reports nocturnal pain.  Difficulty dressing/grooming: Reports Difficulty climbing stairs: Reports Difficulty getting out of chair: Reports Difficulty using hands for taps, buttons, cutlery, and/or writing: Reports   Review of Systems  Constitutional: Positive for fatigue.  HENT: Negative for mouth sores and mouth dryness.   Eyes: Negative for dryness.  Respiratory: Negative for shortness of breath.   Gastrointestinal: Negative for constipation and diarrhea.  Musculoskeletal: Positive for myalgias and myalgias.  Skin: Negative for sensitivity to sunlight.  Psychiatric/Behavioral: Positive for sleep disturbance. Negative for decreased concentration.    PMFS History:  Patient Active Problem List   Diagnosis Date Noted  . Rheumatoid arthritis involving multiple sites with positive rheumatoid factor (HCC) 05/10/2016  . Fibromyalgia 05/10/2016  . Primary osteoarthritis of both hands 05/10/2016  . Chronic pain syndrome/ in pain manaement 05/10/2016  . High risk medication use/ Plaquenil 05/10/2016  . Spondylosis of lumbar region without myelopathy or radiculopathy 05/10/2016  . Abnormal serum protein electrophoresis 05/10/2016  . Vitamin D deficiency 05/10/2016  . Uncontrolled type 2 diabetes mellitus with complication, without  long-term current use of insulin (HCC) 03/13/2016  . Essential hypertension, benign 03/13/2016  . Morbid obesity (HCC) 03/13/2016  . Pulmonary embolism (HCC) 06/06/2011    Class: History of    Past Medical History:  Diagnosis Date  . Anemia   . Arthritis   . Fibromyalgia   . Hypertension   . Lupus   . PE (pulmonary embolism) 2009    Family History  Problem Relation Age of Onset  . Stroke Mother   . Cancer Other   . Diabetes Other   . Pseudochol deficiency Neg Hx   . Malignant hyperthermia Neg Hx   . Hypotension Neg Hx   . Anesthesia problems Neg Hx    Past Surgical History:  Procedure Laterality Date  . ABLATION    . CHOLECYSTECTOMY    . DILATION AND CURETTAGE OF UTERUS    . TUBAL LIGATION     Social History   Social History Narrative  . No narrative on file     Objective: Vital Signs: BP 140/86   Pulse 80   Resp 16   Ht 5\' 7"  (1.702 m)   Wt 260 lb (117.9 kg)   BMI 40.72 kg/m    Physical Exam  Constitutional: She is oriented to person, place, and time. She appears well-developed and well-nourished.  HENT:  Head: Normocephalic and atraumatic.  Eyes: Pupils are equal, round, and reactive to light. EOM are normal.  Cardiovascular: Normal rate, regular rhythm and normal heart sounds.  Exam reveals no gallop and no friction rub.   No murmur heard. Pulmonary/Chest: Effort normal and  breath sounds normal. She has no wheezes. She has no rales.  Abdominal: Soft. Bowel sounds are normal. She exhibits no distension. There is no tenderness. There is no guarding. No hernia.  Musculoskeletal: Normal range of motion. She exhibits no edema, tenderness or deformity.  Lymphadenopathy:    She has no cervical adenopathy.  Neurological: She is alert and oriented to person, place, and time. Coordination normal.  Skin: Skin is warm and dry. Capillary refill takes less than 2 seconds. No rash noted.  Psychiatric: She has a normal mood and affect. Her behavior is normal.    Nursing note and vitals reviewed.    Musculoskeletal Exam:  FULL RANGE OF MOTION OF ALL JOINTS gRIP STRENGTH IS EQUAL AND STRONG BILATERALLY fIBROMYALGIA TENDER POINTS ARE 18 OUT OF 18 POSITIVE  CDAI Exam: No CDAI exam completed.  No synovitis on examination.  Investigation: No additional findings. Office Visit on 03/13/2016  Component Date Value Ref Range Status  . Hemoglobin A1C 12/24/2015 12.1   Final     Imaging: No results found.  Speciality Comments: No specialty comments available.    Procedures:  Trigger Point Inj Date/Time: 11/30/2016 3:15 PM Performed by: Tawni Pummel Authorized by: Tawni Pummel   Consent Given by:  Patient Site marked: the procedure site was marked   Timeout: prior to procedure the correct patient, procedure, and site was verified   Indications:  Muscle spasm and pain Total # of Trigger Points:  2 Location: neck   Needle Size:  27 G Approach:  Dorsal Medications #1:  0.5 mL lidocaine 1 %; 10 mg triamcinolone acetonide 40 MG/ML Medications #2:  0.5 mL lidocaine 1 %; 10 mg triamcinolone acetonide 40 MG/ML Patient tolerance:  Patient tolerated the procedure well with no immediate complications Comments: bilateral trapezius muscle spasm Large Joint Inj Date/Time: 11/30/2016 3:16 PM Performed by: Tawni Pummel Authorized by: Tawni Pummel   Consent Given by:  Patient Site marked: the procedure site was marked   Timeout: prior to procedure the correct patient, procedure, and site was verified   Indications:  Pain Location:  Sacroiliac Site:  R sacroiliac joint Prep: patient was prepped and draped in usual sterile fashion   Needle Size:  27 G Needle Length:  1.5 inches Approach:  Superior Ultrasound Guidance: No   Fluoroscopic Guidance: No   Arthrogram: No   Medications:  40 mg triamcinolone acetonide 40 MG/ML; 1.5 mL lidocaine 1 %; 1 mL lidocaine 1 % Aspiration Attempted: Yes   Aspirate amount (mL):  0 Patient  tolerance:  Patient tolerated the procedure well with no immediate complications Large Joint Inj Date/Time: 11/30/2016 3:16 PM Performed by: Tawni Pummel Authorized by: Tawni Pummel   Consent Given by:  Patient Site marked: the procedure site was marked   Timeout: prior to procedure the correct patient, procedure, and site was verified   Indications:  Pain Location:  Sacroiliac Site:  L sacroiliac joint Prep: patient was prepped and draped in usual sterile fashion   Needle Size:  27 G Needle Length:  1.5 inches Approach:  Superior Ultrasound Guidance: No   Fluoroscopic Guidance: No   Arthrogram: No   Medications:  1.5 mL lidocaine 1 %; 40 mg triamcinolone acetonide 40 MG/ML Aspiration Attempted: Yes   Aspirate amount (mL):  0 Patient tolerance:  Patient tolerated the procedure well with no immediate complications  Left SI joint pain   Allergies: Codeine; Compazine; Ivp dye [iodinated diagnostic agents]; Lisinopril; Nitrofurantoin monohyd macro; Penicillins; and Sulfasalazine   Assessment /  Plan:     Visit Diagnoses: Rheumatoid arthritis involving multiple sites with positive rheumatoid factor (HCC)  High risk medication use - Plan: COMPLETE METABOLIC PANEL WITH GFR, CBC with Differential/Platelet  Fibromyalgia  Sacroiliitis, not elsewhere classified (HCC)  Trapezius muscle spasm   Plan: #1: Rheumatoid arthritis, seropositive. No joint pain swelling and stiffness from rheumatoid arthritis. Well-controlled with Plaquenil.  #2: High risk prescription. Plaquenil 200 mg twice a day Monday through Friday Patient is due for labs today. We will do CBC with differential and CMP with GFR.  #3: Bilateral trapezius muscle and pain radiates to bilateral shoulders. See procedure notes for full details. 10 mg of Kenalog mixed with 0.5 mL's 1% lidocaine  #4: Bilateral SI joint pain. See procedure notes for full details. 40 mg of Kenalog mixed with one half mL's 1%  lidocaine. Patient tolerated procedure well.  #5: Return to clinic in 5 months.  #6:Patient will begin exercising to do water aerobics for fibromyalgia controlled. Patient does flare when she has cold water in the pool. She will call around to seewhich place has tolerable water temperatureso she does not flare her fibromyalgia.  Orders: Orders Placed This Encounter  Procedures  . Trigger Point Injection  . Large Joint Injection/Arthrocentesis  . Large Joint Injection/Arthrocentesis   No orders of the defined types were placed in this encounter.   Face-to-face time spent with patient was 30 minutes. 50% of time was spent in counseling and coordination of care.  Follow-Up Instructions: No Follow-up on file.   Tawni Pummel, PA-C  Note - This record has been created using [employer].  Chart creation errors have been sought, but may not always  have been located. Such creation errors do not reflect on  the standard of medical care.

## 2016-12-01 NOTE — Progress Notes (Signed)
Glucose mildly elevated. Rest of the labs are stable.

## 2016-12-05 ENCOUNTER — Ambulatory Visit (HOSPITAL_COMMUNITY): Payer: Medicare Other | Admitting: Licensed Clinical Social Worker

## 2016-12-06 ENCOUNTER — Telehealth: Payer: Self-pay | Admitting: Radiology

## 2016-12-06 NOTE — Telephone Encounter (Signed)
I have called patient to advise  Left mssg

## 2016-12-06 NOTE — Telephone Encounter (Signed)
-----   Message from Caffie Damme, RT sent at 12/01/2016 12:27 PM EDT ----- Glucose elevated, labs stable / sent to pool

## 2016-12-07 ENCOUNTER — Ambulatory Visit (INDEPENDENT_AMBULATORY_CARE_PROVIDER_SITE_OTHER): Payer: Medicare Other | Admitting: Psychiatry

## 2016-12-07 ENCOUNTER — Encounter (HOSPITAL_COMMUNITY): Payer: Self-pay | Admitting: Psychiatry

## 2016-12-07 VITALS — BP 140/97 | HR 96 | Ht 67.0 in | Wt 254.0 lb

## 2016-12-07 DIAGNOSIS — Z62810 Personal history of physical and sexual abuse in childhood: Secondary | ICD-10-CM | POA: Diagnosis not present

## 2016-12-07 DIAGNOSIS — F331 Major depressive disorder, recurrent, moderate: Secondary | ICD-10-CM

## 2016-12-07 DIAGNOSIS — E119 Type 2 diabetes mellitus without complications: Secondary | ICD-10-CM

## 2016-12-07 DIAGNOSIS — I2699 Other pulmonary embolism without acute cor pulmonale: Secondary | ICD-10-CM

## 2016-12-07 DIAGNOSIS — F1021 Alcohol dependence, in remission: Secondary | ICD-10-CM

## 2016-12-07 DIAGNOSIS — Z79899 Other long term (current) drug therapy: Secondary | ICD-10-CM

## 2016-12-07 DIAGNOSIS — E785 Hyperlipidemia, unspecified: Secondary | ICD-10-CM

## 2016-12-07 DIAGNOSIS — G4700 Insomnia, unspecified: Secondary | ICD-10-CM

## 2016-12-07 DIAGNOSIS — G8929 Other chronic pain: Secondary | ICD-10-CM | POA: Diagnosis not present

## 2016-12-07 DIAGNOSIS — M797 Fibromyalgia: Secondary | ICD-10-CM

## 2016-12-07 DIAGNOSIS — Z7901 Long term (current) use of anticoagulants: Secondary | ICD-10-CM | POA: Diagnosis not present

## 2016-12-07 DIAGNOSIS — I10 Essential (primary) hypertension: Secondary | ICD-10-CM | POA: Diagnosis not present

## 2016-12-07 DIAGNOSIS — M069 Rheumatoid arthritis, unspecified: Secondary | ICD-10-CM

## 2016-12-07 DIAGNOSIS — M329 Systemic lupus erythematosus, unspecified: Secondary | ICD-10-CM

## 2016-12-07 DIAGNOSIS — F515 Nightmare disorder: Secondary | ICD-10-CM | POA: Diagnosis not present

## 2016-12-07 MED ORDER — LORAZEPAM 1 MG PO TABS: 1.0000 mg | ORAL_TABLET | Freq: Two times a day (BID) | ORAL | 2 refills | Status: DC | PRN

## 2016-12-07 MED ORDER — ZIPRASIDONE HCL 40 MG PO CAPS: 40.0000 mg | ORAL_CAPSULE | Freq: Every day | ORAL | 0 refills | Status: DC

## 2016-12-07 MED ORDER — DULOXETINE HCL 30 MG PO CPEP: ORAL_CAPSULE | ORAL | 0 refills | Status: DC

## 2016-12-07 MED ORDER — DULOXETINE HCL 60 MG PO CPEP: ORAL_CAPSULE | ORAL | 0 refills | Status: DC

## 2016-12-07 NOTE — Patient Instructions (Signed)
1. Continue Duloxetine 90 mg (60 mg + 30 mg) daily 2. Continue Geodon 40 mg daily  3. Continue Ativan 1 mg twice a day as needed for anxiety  4. Return to clinic in three months for 30 mins
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
## 2016-12-11 DIAGNOSIS — Z23 Encounter for immunization: Secondary | ICD-10-CM | POA: Diagnosis not present

## 2016-12-11 DIAGNOSIS — M797 Fibromyalgia: Secondary | ICD-10-CM | POA: Diagnosis not present

## 2016-12-11 DIAGNOSIS — M2550 Pain in unspecified joint: Secondary | ICD-10-CM | POA: Diagnosis not present

## 2016-12-11 DIAGNOSIS — M069 Rheumatoid arthritis, unspecified: Secondary | ICD-10-CM | POA: Diagnosis not present

## 2016-12-11 DIAGNOSIS — Z79891 Long term (current) use of opiate analgesic: Secondary | ICD-10-CM | POA: Diagnosis not present

## 2016-12-12 ENCOUNTER — Encounter: Payer: Self-pay | Admitting: Cardiovascular Disease

## 2016-12-12 ENCOUNTER — Ambulatory Visit (INDEPENDENT_AMBULATORY_CARE_PROVIDER_SITE_OTHER): Payer: Medicare Other | Admitting: Cardiovascular Disease

## 2016-12-12 VITALS — BP 138/92 | HR 84 | Ht 67.0 in | Wt 253.0 lb

## 2016-12-12 DIAGNOSIS — M329 Systemic lupus erythematosus, unspecified: Secondary | ICD-10-CM

## 2016-12-12 DIAGNOSIS — Z7189 Other specified counseling: Secondary | ICD-10-CM

## 2016-12-12 DIAGNOSIS — I10 Essential (primary) hypertension: Secondary | ICD-10-CM | POA: Diagnosis not present

## 2016-12-12 DIAGNOSIS — I2699 Other pulmonary embolism without acute cor pulmonale: Secondary | ICD-10-CM

## 2016-12-12 NOTE — Patient Instructions (Signed)
Medication Instructions:  Continue all current medications.  Labwork: none  Testing/Procedures: none  Follow-Up: Your physician wants you to follow up in:  1 year.  You will receive a reminder letter in the mail one-two months in advance.  If you don't receive a letter, please call our office to schedule the follow up appointment   Any Other Special Instructions Will Be Listed Below (If Applicable). Establish with our coumadin clinic Vashti Hey, RN).  If you need a refill on your cardiac medications before your next appointment, please call your pharmacy.

## 2016-12-12 NOTE — Progress Notes (Signed)
CARDIOLOGY CONSULT NOTE  Patient ID: Haley Goodman MRN: [id number] DOB/AGE: [birth year] 36 y.o.  Admit date: (Not on file) Primary Physician: Avon Gully, MD Referring Physician:   Avon Gully, MD   Reason for Consultation: Anticoagulation monitoring for chronic pulmonary was some  HPI: Haley Goodman is a 36 y.o. female who is being seen today for the evaluation of anticoagulation monitoring for chronic pulmonary embolism at the request of Avon Gully, MD.   Past medical history includes SLE, etc. arthritis, pulmonary embolism, diabetes, dyslipidemia, and hypertension. She is chronically anticoagulated with warfarin.  I have personally reviewed all documentation, labs, radiographic and cardiovascular studies, and independently interpreted all ECG's.  Labs 10/27/16: Total cholesterol 236, HDL 64, trig glycerides 131, LDL 146, BUN 8, creatinine 1, A1c 6.5%, hemoglobin 11.4, platelets 278.  CT angiogram of the chest on 02/17/15 did not demonstrate any evidence of pulmonary embolism.  She currently denies chest pain and shortness of breath. She said she had a pulmonary embolism in 2008 attributed to SLE. She denies a history of DVT. She said her chest wall become swollen when she has lupus flares. Her legs also become edematous. She presently denies any leg swelling, orthopnea, and paroxysmal nocturnal dyspnea.  ECG performed in the office today which I ordered and personally interpreted demonstrates normal sinus rhythm with nonspecific T-wave abnormalities.  Allergies  Allergen Reactions  . Codeine Anaphylaxis and Swelling  . Compazine     Not in right state of mind.   [name] [Iodinated Diagnostic Agents] Nausea Only  . Lisinopril   . Nitrofurantoin Monohyd Macro Hives and Swelling  . Penicillins Hives  . Sulfasalazine Hives    Current Outpatient Prescriptions  Medication Sig Dispense Refill  . AMITIZA 24 MCG capsule Take 24 mcg by mouth  daily.  0  . amitriptyline (ELAVIL) 50 MG tablet Take 50 mg by mouth at bedtime.  0  . Buprenorphine (BUTRANS) 7.5 MCG/HR PTWK Place 7.5 mcg onto the skin every 7 (seven) days.    . Cholecalciferol (VITAMIN D3) 50000 units CAPS TAKE 1 CAPSULE BY MOUTH WEEKLY  0  . DULoxetine (CYMBALTA) 30 MG capsule Total of 90 mg daily (60 mg +30 mg) 90 capsule 0  . DULoxetine (CYMBALTA) 60 MG capsule Total of 90 mg daily (60 mg +30 mg) 90 capsule 0  . esomeprazole (NEXIUM) 40 MG capsule Take 40 mg by mouth daily.  0  . hydrALAZINE (APRESOLINE) 25 MG tablet Take 25 mg by mouth 4 (four) times daily.  0  . hydroxychloroquine (PLAQUENIL) 200 MG tablet Take 200 mg by mouth 2 (two) times daily.    . hydrOXYzine (VISTARIL) 50 MG capsule Take 50 mg by mouth every 8 (eight) hours as needed.    . Insulin Aspart (NOVOLOG FLEXPEN [place]) Inject 10-16 Units into the skin 3 (three) times daily with meals.    . Insulin Detemir (LEVEMIR FLEXTOUCH) 100 UNIT/ML Pen Inject 30 Units into the skin at bedtime. (Patient taking differently: Inject 30 Units into the skin at bedtime as needed. ) 15 mL 2  . LORazepam (ATIVAN) 1 MG tablet Take 1 tablet (1 mg total) by mouth 2 (two) times daily as needed for anxiety. 60 tablet 2  . NOVOLOG FLEXPEN 100 UNIT/ML FlexPen INJECT 90 UNITS INTO THE SKIN DAILY OR FOLLOW SLIDING SCALE  0  . oxyCODONE-acetaminophen (PERCOCET) 10-325 MG tablet Take 1 tablet by mouth every 8 (eight) hours as needed for pain.    . potassium  chloride (K-DUR) 10 MEQ tablet Take 10 mEq by mouth 2 (two) times daily.  0  . rOPINIRole (REQUIP) 0.25 MG tablet Take 0.25 mg by mouth at bedtime.  0  . Suvorexant (BELSOMRA) 10 MG TABS Take 10 mg by mouth at bedtime.    Marland Kitchen tiZANidine (ZANAFLEX) 4 MG tablet Take 4 mg by mouth every 6 (six) hours as needed for muscle spasms.    . Topiramate ER (TROKENDI XR) 100 MG CP24 Take 100 mg by mouth at bedtime.    Marland Kitchen warfarin (COUMADIN) 7.5 MG tablet Take 5-7.5 mg by mouth daily. Saturday and  Sunday take 5mg . All other days take 7.5mg     . ziprasidone (GEODON) 40 MG capsule Take 1 capsule (40 mg total) by mouth daily. 90 capsule 0   No current facility-administered medications for this visit.     Past Medical History:  Diagnosis Date  . Anemia   . Arthritis   . Fibromyalgia   . Hypertension   . Lupus   . PE (pulmonary embolism) 2009  . RA (rheumatoid arthritis) (HCC)     Past Surgical History:  Procedure Laterality Date  . ABLATION    . CHOLECYSTECTOMY    . DILATION AND CURETTAGE OF UTERUS    . TUBAL LIGATION      Social History   Social History  . Marital status: Married    Spouse name: N/A  . Number of children: N/A  . Years of education: N/A   Occupational History  . Not on file.   Social History Main Topics  . Smoking status: Never Smoker  . Smokeless tobacco: Never Used  . Alcohol use No     Comment: 08-21-2016 per pt no  . Drug use: No     Comment: 08-21-2016 per pt no  . Sexual activity: Not on file   Other Topics Concern  . Not on file   Social History Narrative  . No narrative on file     No family history of premature CAD in 1st degree relatives.  Current Meds  Medication Sig  . AMITIZA 24 MCG capsule Take 24 mcg by mouth daily.  Marland Kitchen amitriptyline (ELAVIL) 50 MG tablet Take 50 mg by mouth at bedtime.  . Buprenorphine (BUTRANS) 7.5 MCG/HR PTWK Place 7.5 mcg onto the skin every 7 (seven) days.  . Cholecalciferol (VITAMIN D3) 50000 units CAPS TAKE 1 CAPSULE BY MOUTH WEEKLY  . DULoxetine (CYMBALTA) 30 MG capsule Total of 90 mg daily (60 mg +30 mg)  . DULoxetine (CYMBALTA) 60 MG capsule Total of 90 mg daily (60 mg +30 mg)  . esomeprazole (NEXIUM) 40 MG capsule Take 40 mg by mouth daily.  . hydrALAZINE (APRESOLINE) 25 MG tablet Take 25 mg by mouth 4 (four) times daily.  . hydroxychloroquine (PLAQUENIL) 200 MG tablet Take 200 mg by mouth 2 (two) times daily.  . hydrOXYzine (VISTARIL) 50 MG capsule Take 50 mg by mouth every 8 (eight) hours  as needed.  . Insulin Aspart (NOVOLOG FLEXPEN [place]) Inject 10-16 Units into the skin 3 (three) times daily with meals.  . Insulin Detemir (LEVEMIR FLEXTOUCH) 100 UNIT/ML Pen Inject 30 Units into the skin at bedtime. (Patient taking differently: Inject 30 Units into the skin at bedtime as needed. )  . LORazepam (ATIVAN) 1 MG tablet Take 1 tablet (1 mg total) by mouth 2 (two) times daily as needed for anxiety.  Marland Kitchen NOVOLOG FLEXPEN 100 UNIT/ML FlexPen INJECT 90 UNITS INTO THE SKIN DAILY OR FOLLOW SLIDING SCALE  .  oxyCODONE-acetaminophen (PERCOCET) 10-325 MG tablet Take 1 tablet by mouth every 8 (eight) hours as needed for pain.  . potassium chloride (K-DUR) 10 MEQ tablet Take 10 mEq by mouth 2 (two) times daily.  Marland Kitchen rOPINIRole (REQUIP) 0.25 MG tablet Take 0.25 mg by mouth at bedtime.  . Suvorexant (BELSOMRA) 10 MG TABS Take 10 mg by mouth at bedtime.  Marland Kitchen tiZANidine (ZANAFLEX) 4 MG tablet Take 4 mg by mouth every 6 (six) hours as needed for muscle spasms.  . Topiramate ER (TROKENDI XR) 100 MG CP24 Take 100 mg by mouth at bedtime.  Marland Kitchen warfarin (COUMADIN) 7.5 MG tablet Take 5-7.5 mg by mouth daily. Saturday and Sunday take 5mg . All other days take 7.5mg   . ziprasidone (GEODON) 40 MG capsule Take 1 capsule (40 mg total) by mouth daily.      Review of systems complete and found to be negative unless listed above in HPI    Physical exam Blood pressure (!) 138/92, pulse 84, height 5\' 7"  (1.702 m), weight 253 lb (114.8 kg), SpO2 98 %. General: NAD Neck: No JVD, no thyromegaly or thyroid nodule.  Lungs: Clear to auscultation bilaterally with normal respiratory effort. CV: Nondisplaced PMI. Regular rate and rhythm, normal S1/S2, no S3/S4, no murmur.  No peripheral edema.  No carotid bruit.    Abdomen: Soft, nontender, no distention.  Skin: Intact without lesions or rashes.  Neurologic: Alert and oriented x 3.  Psych: Normal affect. Extremities: No clubbing or cyanosis.  HEENT: Normal.   ECG: Most recent  ECG reviewed.   Labs: Lab Results  Component Value Date/Time   K 4.6 11/30/2016 03:40 PM   BUN 8 11/30/2016 03:40 PM   CREATININE 0.84 11/30/2016 03:40 PM   ALT 18 11/30/2016 03:40 PM   HGB 12.0 11/30/2016 03:40 PM     Lipids: No results found for: LDLCALC, LDLDIRECT, CHOL, TRIG, HDL      ASSESSMENT AND PLAN:  1. Pulmonary embolism: She is currently asymptomatic. This was reportedly an isolated instance in 2008 treated SLE. She has been chronically anticoagulated with warfarin. I will enroll her in our anticoagulation clinic for routine monitoring.  2. Hypertension: Blood pressure is mildly elevated. No medication adjustments today.  3. SLE: She is currently on hydroxychloroquine and monitored by a rheumatologist in [place].     Disposition: Follow up with me in 1 year.   Signed: 2009, M.D., F.A.C.C.  12/12/2016, 1:49 PM

## 2016-12-13 ENCOUNTER — Ambulatory Visit: Payer: Medicare Other | Admitting: Rheumatology

## 2016-12-18 ENCOUNTER — Ambulatory Visit (HOSPITAL_COMMUNITY): Payer: Medicare Other | Admitting: Psychiatry

## 2016-12-21 ENCOUNTER — Ambulatory Visit (HOSPITAL_COMMUNITY): Payer: Medicare Other | Admitting: Psychiatry

## 2017-01-11 ENCOUNTER — Ambulatory Visit (INDEPENDENT_AMBULATORY_CARE_PROVIDER_SITE_OTHER): Payer: Medicare Other | Admitting: *Deleted

## 2017-01-11 DIAGNOSIS — Z5181 Encounter for therapeutic drug level monitoring: Secondary | ICD-10-CM | POA: Diagnosis not present

## 2017-01-11 DIAGNOSIS — I2699 Other pulmonary embolism without acute cor pulmonale: Secondary | ICD-10-CM | POA: Diagnosis not present

## 2017-01-11 LAB — POCT INR: INR: 5.9

## 2017-01-30 ENCOUNTER — Encounter: Payer: Self-pay | Admitting: *Deleted

## 2017-01-30 ENCOUNTER — Ambulatory Visit (INDEPENDENT_AMBULATORY_CARE_PROVIDER_SITE_OTHER): Payer: Medicare Other | Admitting: *Deleted

## 2017-01-30 DIAGNOSIS — Z5181 Encounter for therapeutic drug level monitoring: Secondary | ICD-10-CM

## 2017-01-30 DIAGNOSIS — I2699 Other pulmonary embolism without acute cor pulmonale: Secondary | ICD-10-CM

## 2017-01-30 LAB — POCT INR: INR: 2.7

## 2017-02-08 ENCOUNTER — Encounter: Payer: Self-pay | Admitting: Internal Medicine

## 2017-02-08 DIAGNOSIS — G894 Chronic pain syndrome: Secondary | ICD-10-CM | POA: Diagnosis not present

## 2017-02-08 DIAGNOSIS — Z79891 Long term (current) use of opiate analgesic: Secondary | ICD-10-CM | POA: Diagnosis not present

## 2017-02-08 DIAGNOSIS — G4700 Insomnia, unspecified: Secondary | ICD-10-CM | POA: Diagnosis not present

## 2017-02-08 DIAGNOSIS — M545 Low back pain: Secondary | ICD-10-CM | POA: Diagnosis not present

## 2017-02-08 DIAGNOSIS — M791 Myalgia, unspecified site: Secondary | ICD-10-CM | POA: Diagnosis not present

## 2017-02-15 ENCOUNTER — Ambulatory Visit (INDEPENDENT_AMBULATORY_CARE_PROVIDER_SITE_OTHER): Payer: Medicare Other | Admitting: *Deleted

## 2017-02-15 DIAGNOSIS — I2699 Other pulmonary embolism without acute cor pulmonale: Secondary | ICD-10-CM

## 2017-02-15 DIAGNOSIS — Z5181 Encounter for therapeutic drug level monitoring: Secondary | ICD-10-CM

## 2017-02-15 LAB — POCT INR: INR: 3.4
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
## 2017-02-28 ENCOUNTER — Ambulatory Visit (HOSPITAL_COMMUNITY): Payer: Medicare Other | Admitting: Psychiatry

## 2017-03-08 DIAGNOSIS — I10 Essential (primary) hypertension: Secondary | ICD-10-CM | POA: Diagnosis not present

## 2017-03-08 DIAGNOSIS — M069 Rheumatoid arthritis, unspecified: Secondary | ICD-10-CM | POA: Diagnosis not present

## 2017-03-08 DIAGNOSIS — F339 Major depressive disorder, recurrent, unspecified: Secondary | ICD-10-CM | POA: Diagnosis not present

## 2017-03-08 DIAGNOSIS — E1165 Type 2 diabetes mellitus with hyperglycemia: Secondary | ICD-10-CM | POA: Diagnosis not present

## 2017-03-09 ENCOUNTER — Ambulatory Visit (INDEPENDENT_AMBULATORY_CARE_PROVIDER_SITE_OTHER): Payer: Medicare Other | Admitting: Pharmacist

## 2017-03-09 DIAGNOSIS — M329 Systemic lupus erythematosus, unspecified: Secondary | ICD-10-CM | POA: Diagnosis not present

## 2017-03-09 DIAGNOSIS — Z794 Long term (current) use of insulin: Secondary | ICD-10-CM | POA: Diagnosis not present

## 2017-03-09 DIAGNOSIS — M797 Fibromyalgia: Secondary | ICD-10-CM | POA: Diagnosis not present

## 2017-03-09 DIAGNOSIS — Z5181 Encounter for therapeutic drug level monitoring: Secondary | ICD-10-CM

## 2017-03-09 DIAGNOSIS — Z7901 Long term (current) use of anticoagulants: Secondary | ICD-10-CM | POA: Diagnosis not present

## 2017-03-09 DIAGNOSIS — M069 Rheumatoid arthritis, unspecified: Secondary | ICD-10-CM | POA: Diagnosis not present

## 2017-03-09 DIAGNOSIS — G4700 Insomnia, unspecified: Secondary | ICD-10-CM | POA: Diagnosis not present

## 2017-03-09 DIAGNOSIS — Z79899 Other long term (current) drug therapy: Secondary | ICD-10-CM | POA: Diagnosis not present

## 2017-03-09 DIAGNOSIS — I2699 Other pulmonary embolism without acute cor pulmonale: Secondary | ICD-10-CM

## 2017-03-09 DIAGNOSIS — I10 Essential (primary) hypertension: Secondary | ICD-10-CM | POA: Diagnosis not present

## 2017-03-09 DIAGNOSIS — Z86711 Personal history of pulmonary embolism: Secondary | ICD-10-CM | POA: Diagnosis not present

## 2017-03-09 DIAGNOSIS — K219 Gastro-esophageal reflux disease without esophagitis: Secondary | ICD-10-CM | POA: Diagnosis not present

## 2017-03-09 DIAGNOSIS — G43009 Migraine without aura, not intractable, without status migrainosus: Secondary | ICD-10-CM | POA: Diagnosis not present

## 2017-03-09 LAB — POCT INR: INR: 3.3

## 2017-03-09 NOTE — Patient Instructions (Signed)
Description   Hold coumadin tonight then start taking 1 tablet daily except 1/2 tablet on Wednesdays. Recheck in 2 weeks

## 2017-03-10 DIAGNOSIS — M797 Fibromyalgia: Secondary | ICD-10-CM | POA: Diagnosis not present

## 2017-03-10 DIAGNOSIS — E119 Type 2 diabetes mellitus without complications: Secondary | ICD-10-CM | POA: Diagnosis not present

## 2017-03-10 DIAGNOSIS — M329 Systemic lupus erythematosus, unspecified: Secondary | ICD-10-CM | POA: Diagnosis not present

## 2017-03-10 DIAGNOSIS — M069 Rheumatoid arthritis, unspecified: Secondary | ICD-10-CM | POA: Diagnosis not present

## 2017-03-10 DIAGNOSIS — G43909 Migraine, unspecified, not intractable, without status migrainosus: Secondary | ICD-10-CM | POA: Diagnosis not present

## 2017-03-10 DIAGNOSIS — Z794 Long term (current) use of insulin: Secondary | ICD-10-CM | POA: Diagnosis not present

## 2017-03-10 DIAGNOSIS — R51 Headache: Secondary | ICD-10-CM | POA: Diagnosis not present

## 2017-03-10 DIAGNOSIS — Z7901 Long term (current) use of anticoagulants: Secondary | ICD-10-CM | POA: Diagnosis not present

## 2017-03-10 DIAGNOSIS — I10 Essential (primary) hypertension: Secondary | ICD-10-CM | POA: Diagnosis not present

## 2017-03-10 DIAGNOSIS — Z79899 Other long term (current) drug therapy: Secondary | ICD-10-CM | POA: Diagnosis not present
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
## 2017-03-16 DIAGNOSIS — M069 Rheumatoid arthritis, unspecified: Secondary | ICD-10-CM | POA: Diagnosis not present

## 2017-03-16 DIAGNOSIS — F339 Major depressive disorder, recurrent, unspecified: Secondary | ICD-10-CM | POA: Diagnosis not present

## 2017-03-16 DIAGNOSIS — E1165 Type 2 diabetes mellitus with hyperglycemia: Secondary | ICD-10-CM | POA: Diagnosis not present

## 2017-03-16 DIAGNOSIS — I10 Essential (primary) hypertension: Secondary | ICD-10-CM | POA: Diagnosis not present

## 2017-03-16 DIAGNOSIS — I2782 Chronic pulmonary embolism: Secondary | ICD-10-CM | POA: Diagnosis not present

## 2017-03-16 DIAGNOSIS — K219 Gastro-esophageal reflux disease without esophagitis: Secondary | ICD-10-CM | POA: Diagnosis not present

## 2017-03-16 DIAGNOSIS — M329 Systemic lupus erythematosus, unspecified: Secondary | ICD-10-CM | POA: Diagnosis not present

## 2017-03-19 ENCOUNTER — Ambulatory Visit (HOSPITAL_COMMUNITY): Payer: Self-pay | Admitting: Psychiatry

## 2017-03-19 ENCOUNTER — Other Ambulatory Visit (HOSPITAL_COMMUNITY): Payer: Self-pay | Admitting: Psychiatry

## 2017-03-19 ENCOUNTER — Encounter (HOSPITAL_COMMUNITY): Payer: Self-pay | Admitting: Psychiatry

## 2017-03-19 MED ORDER — ZIPRASIDONE HCL 40 MG PO CAPS: 40.0000 mg | ORAL_CAPSULE | Freq: Every day | ORAL | 0 refills | Status: DC

## 2017-03-19 MED ORDER — DULOXETINE HCL 30 MG PO CPEP: ORAL_CAPSULE | ORAL | 0 refills | Status: DC

## 2017-03-19 MED ORDER — DULOXETINE HCL 60 MG PO CPEP: ORAL_CAPSULE | ORAL | 0 refills | Status: DC

## 2017-03-22 ENCOUNTER — Ambulatory Visit (INDEPENDENT_AMBULATORY_CARE_PROVIDER_SITE_OTHER): Payer: Medicare Other | Admitting: Psychiatry

## 2017-03-22 ENCOUNTER — Ambulatory Visit (INDEPENDENT_AMBULATORY_CARE_PROVIDER_SITE_OTHER): Payer: Medicare Other | Admitting: *Deleted

## 2017-03-22 ENCOUNTER — Encounter (HOSPITAL_COMMUNITY): Payer: Self-pay | Admitting: Psychiatry

## 2017-03-22 VITALS — BP 141/93 | HR 106 | Ht 67.0 in | Wt 256.0 lb

## 2017-03-22 DIAGNOSIS — F331 Major depressive disorder, recurrent, moderate: Secondary | ICD-10-CM | POA: Insufficient documentation

## 2017-03-22 DIAGNOSIS — F329 Major depressive disorder, single episode, unspecified: Secondary | ICD-10-CM | POA: Insufficient documentation

## 2017-03-22 DIAGNOSIS — G4700 Insomnia, unspecified: Secondary | ICD-10-CM

## 2017-03-22 DIAGNOSIS — I2699 Other pulmonary embolism without acute cor pulmonale: Secondary | ICD-10-CM | POA: Diagnosis not present

## 2017-03-22 DIAGNOSIS — R450 Nervousness: Secondary | ICD-10-CM | POA: Diagnosis not present

## 2017-03-22 DIAGNOSIS — F419 Anxiety disorder, unspecified: Secondary | ICD-10-CM

## 2017-03-22 DIAGNOSIS — Z5181 Encounter for therapeutic drug level monitoring: Secondary | ICD-10-CM | POA: Diagnosis not present

## 2017-03-22 LAB — POCT INR: INR: 2.3

## 2017-03-22 MED ORDER — DULOXETINE HCL 60 MG PO CPEP: 60.0000 mg | ORAL_CAPSULE | Freq: Two times a day (BID) | ORAL | 0 refills | Status: DC

## 2017-03-22 MED ORDER — LORAZEPAM 1 MG PO TABS: 1.0000 mg | ORAL_TABLET | Freq: Two times a day (BID) | ORAL | 2 refills | Status: DC | PRN

## 2017-03-22 MED ORDER — ZIPRASIDONE HCL 40 MG PO CAPS: 40.0000 mg | ORAL_CAPSULE | Freq: Every day | ORAL | 0 refills | Status: DC

## 2017-03-22 NOTE — Patient Instructions (Signed)
1. Continue duloxetine 60 mg twice a day  2. Continue Geodon 40 mg daily  3. Continue Ativan 1 mg twice a day as needed for anxiety (She lost prescription paper- will make a refill) 4. Return to clinic in three months for 15 mins

## 2017-03-22 NOTE — Patient Instructions (Signed)
Hold coumadin tonight then start taking 1 tablet daily except 1/2 tablet on Wednesdays. Recheck in 4 weeks
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
## 2017-03-29 DIAGNOSIS — N644 Mastodynia: Secondary | ICD-10-CM | POA: Diagnosis not present

## 2017-03-29 DIAGNOSIS — R102 Pelvic and perineal pain: Secondary | ICD-10-CM | POA: Diagnosis not present

## 2017-03-29 DIAGNOSIS — N63 Unspecified lump in unspecified breast: Secondary | ICD-10-CM | POA: Diagnosis not present

## 2017-04-04 ENCOUNTER — Ambulatory Visit (INDEPENDENT_AMBULATORY_CARE_PROVIDER_SITE_OTHER): Payer: Medicare Other | Admitting: Licensed Clinical Social Worker

## 2017-04-04 ENCOUNTER — Encounter (HOSPITAL_COMMUNITY): Payer: Self-pay | Admitting: Licensed Clinical Social Worker

## 2017-04-04 DIAGNOSIS — M545 Low back pain: Secondary | ICD-10-CM | POA: Diagnosis not present

## 2017-04-04 DIAGNOSIS — F331 Major depressive disorder, recurrent, moderate: Secondary | ICD-10-CM | POA: Diagnosis not present

## 2017-04-04 DIAGNOSIS — G894 Chronic pain syndrome: Secondary | ICD-10-CM | POA: Diagnosis not present

## 2017-04-04 DIAGNOSIS — Z62810 Personal history of physical and sexual abuse in childhood: Secondary | ICD-10-CM

## 2017-04-04 DIAGNOSIS — Z79891 Long term (current) use of opiate analgesic: Secondary | ICD-10-CM | POA: Diagnosis not present

## 2017-04-04 DIAGNOSIS — M791 Myalgia, unspecified site: Secondary | ICD-10-CM | POA: Diagnosis not present

## 2017-04-04 DIAGNOSIS — G4700 Insomnia, unspecified: Secondary | ICD-10-CM | POA: Diagnosis not present

## 2017-04-04 DIAGNOSIS — M329 Systemic lupus erythematosus, unspecified: Secondary | ICD-10-CM | POA: Diagnosis not present
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
## 2017-04-06 ENCOUNTER — Ambulatory Visit: Payer: Medicare Other | Admitting: Gastroenterology

## 2017-04-11 DIAGNOSIS — R102 Pelvic and perineal pain: Secondary | ICD-10-CM | POA: Insufficient documentation

## 2017-04-11 HISTORY — DX: Pelvic and perineal pain: R10.2

## 2017-04-18 DIAGNOSIS — N6489 Other specified disorders of breast: Secondary | ICD-10-CM | POA: Diagnosis not present

## 2017-04-18 DIAGNOSIS — N644 Mastodynia: Secondary | ICD-10-CM | POA: Diagnosis not present

## 2017-04-18 DIAGNOSIS — R928 Other abnormal and inconclusive findings on diagnostic imaging of breast: Secondary | ICD-10-CM | POA: Diagnosis not present

## 2017-04-18 DIAGNOSIS — N6320 Unspecified lump in the left breast, unspecified quadrant: Secondary | ICD-10-CM | POA: Diagnosis not present

## 2017-04-25 ENCOUNTER — Telehealth: Payer: Self-pay | Admitting: Cardiovascular Disease

## 2017-04-25 MED ORDER — WARFARIN SODIUM 7.5 MG PO TABS: ORAL_TABLET | ORAL | 4 refills | Status: DC

## 2017-04-25 NOTE — Telephone Encounter (Signed)
°*  STAT* If patient is at the pharmacy, call can be transferred to refill team.   1. Which medications need to be refilled? warfarin (COUMADIN) 7.5 MG tablet    2. Which pharmacy/location (including street and city if local pharmacy) is medication to be sent to? [facility] IN [place]  3. Do they need a 30 day or 90 day supply?    Patient is OUT of medication

## 2017-04-25 NOTE — Progress Notes (Deleted)
Office Visit Note  Patient: Haley Goodman             Date of Birth: Mar 31, 1980           MRN: [id number]             PCP: Avon Gully, MD Referring: Avon Gully, MD Visit Date: 05/08/2017 Occupation: @GUAROCC @    Subjective:  No chief complaint on file.   History of Present Illness: Haley Goodman is a 37 y.o. female ***   Activities of Daily Living:  Patient reports morning stiffness for *** {minute/hour:19697}.   Patient {ACTIONS;DENIES/REPORTS:21021675::"Denies"} nocturnal pain.  Difficulty dressing/grooming: {ACTIONS;DENIES/REPORTS:21021675::"Denies"} Difficulty climbing stairs: {ACTIONS;DENIES/REPORTS:21021675::"Denies"} Difficulty getting out of chair: {ACTIONS;DENIES/REPORTS:21021675::"Denies"} Difficulty using hands for taps, buttons, cutlery, and/or writing: {ACTIONS;DENIES/REPORTS:21021675::"Denies"}   No Rheumatology ROS completed.   PMFS History:  Patient Active Problem List   Diagnosis Date Noted  . MDD (major depressive disorder), recurrent episode, moderate (HCC) 03/22/2017  . Encounter for therapeutic drug monitoring 01/11/2017  . Rheumatoid arthritis involving multiple sites with positive rheumatoid factor (HCC) 05/10/2016  . Fibromyalgia 05/10/2016  . Primary osteoarthritis of both hands 05/10/2016  . Chronic pain syndrome/ in pain manaement 05/10/2016  . High risk medication use/ Plaquenil 05/10/2016  . Spondylosis of lumbar region without myelopathy or radiculopathy 05/10/2016  . Abnormal serum protein electrophoresis 05/10/2016  . Vitamin D deficiency 05/10/2016  . Uncontrolled type 2 diabetes mellitus with complication, without long-term current use of insulin (HCC) 03/13/2016  . Essential hypertension, benign 03/13/2016  . Morbid obesity (HCC) 03/13/2016  . Pulmonary embolism (HCC) 06/06/2011    Class: History of    Past Medical History:  Diagnosis Date  . Anemia   . Arthritis   . Fibromyalgia   . Hypertension   .  Lupus   . PE (pulmonary embolism) 2009  . RA (rheumatoid arthritis) (HCC)     Family History  Problem Relation Age of Onset  . Stroke Mother   . Cancer Other   . Diabetes Other   . Pseudochol deficiency Neg Hx   . Malignant hyperthermia Neg Hx   . Hypotension Neg Hx   . Anesthesia problems Neg Hx    Past Surgical History:  Procedure Laterality Date  . ABLATION    . CHOLECYSTECTOMY    . DILATION AND CURETTAGE OF UTERUS    . TUBAL LIGATION     Social History   Social History Narrative  . Not on file     Objective: Vital Signs: There were no vitals taken for this visit.   Physical Exam   Musculoskeletal Exam: ***  CDAI Exam: No CDAI exam completed.    Investigation: No additional findings. CBC Latest Ref Rng & Units 11/30/2016 02/17/2015 11/02/2013  WBC 3.8 - 10.8 Thousand/uL 8.8 6.1 -  Hemoglobin 11.7 - 15.5 g/dL 01/02/2014 11.0(L) 14.3  Hematocrit 35.0 - 45.0 % 36.1 34.6(L) 42.0  Platelets 140 - 400 Thousand/uL 247 235 -   CMP Latest Ref Rng & Units 11/30/2016 02/17/2015 11/02/2013  Glucose 65 - 99 mg/dL 01/02/2014) 672(C) 947(S)  BUN 7 - 25 mg/dL 8 12 10   Creatinine 0.50 - 1.10 mg/dL 962(E ) 3.66  Sodium 135 - 146 mmol/L 134(L) 135 135(L)  Potassium 3.5 - 5.3 mmol/L 4.6 4.0 4.0  Chloride 98 - 110 mmol/L 99 108 101  CO2 20 - 32 mmol/L 29 20(L) -  Calcium 8.6 - 10.2 mg/dL 8.8 2.94(T) -  Total Protein 6.1 - 8.1 g/dL 7.5 - -  Total Bilirubin 0.2 -  1.2 mg/dL 0.3 - -  Alkaline Phos 39 - 117 U/L - - -  AST 10 - 30 U/L 16 - -  ALT 6 - 29 U/L 18 - -    Imaging: No results found.  Speciality Comments: No specialty comments available.    Procedures:  No procedures performed Allergies: Codeine; Compazine; Ivp dye [iodinated diagnostic agents]; Lisinopril; Nitrofurantoin monohyd macro; Penicillins; and Sulfasalazine   Assessment / Plan:     Visit Diagnoses: No diagnosis found.    Orders: No orders of the defined types were placed in this encounter.  No orders of  the defined types were placed in this encounter.   Face-to-face time spent with patient was *** minutes. 50% of time was spent in counseling and coordination of care.  Follow-Up Instructions: No Follow-up on file.   Ellen Henri, CMA  Note - This record has been created using Animal nutritionist.  Chart creation errors have been sought, but may not always  have been located. Such creation errors do not reflect on  the standard of medical care.

## 2017-04-25 NOTE — Telephone Encounter (Signed)
Warfarin RX sent to [facility]

## 2017-04-27 ENCOUNTER — Ambulatory Visit (INDEPENDENT_AMBULATORY_CARE_PROVIDER_SITE_OTHER): Payer: Medicare Other | Admitting: *Deleted

## 2017-04-27 DIAGNOSIS — Z5181 Encounter for therapeutic drug level monitoring: Secondary | ICD-10-CM | POA: Diagnosis not present

## 2017-04-27 DIAGNOSIS — I2699 Other pulmonary embolism without acute cor pulmonale: Secondary | ICD-10-CM | POA: Diagnosis not present

## 2017-04-27 LAB — POCT INR: INR: 1.2

## 2017-04-27 NOTE — Patient Instructions (Signed)
Take coumadin 1 1/2 tablets tonight and tomorrow then resume 1 tablet daily except 1/2 tablet on Wednesdays. Recheck in 2 weeks

## 2017-05-03 DIAGNOSIS — Z79891 Long term (current) use of opiate analgesic: Secondary | ICD-10-CM | POA: Diagnosis not present

## 2017-05-03 DIAGNOSIS — M791 Myalgia, unspecified site: Secondary | ICD-10-CM | POA: Diagnosis not present

## 2017-05-03 DIAGNOSIS — G4700 Insomnia, unspecified: Secondary | ICD-10-CM | POA: Diagnosis not present

## 2017-05-03 DIAGNOSIS — M545 Low back pain: Secondary | ICD-10-CM | POA: Diagnosis not present

## 2017-05-03 DIAGNOSIS — M329 Systemic lupus erythematosus, unspecified: Secondary | ICD-10-CM | POA: Diagnosis not present

## 2017-05-07 ENCOUNTER — Ambulatory Visit (INDEPENDENT_AMBULATORY_CARE_PROVIDER_SITE_OTHER): Payer: Medicare Other | Admitting: Licensed Clinical Social Worker

## 2017-05-07 ENCOUNTER — Encounter (HOSPITAL_COMMUNITY): Payer: Self-pay | Admitting: Licensed Clinical Social Worker

## 2017-05-07 DIAGNOSIS — F331 Major depressive disorder, recurrent, moderate: Secondary | ICD-10-CM
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
## 2017-05-08 ENCOUNTER — Ambulatory Visit: Payer: Medicare Other | Admitting: Rheumatology

## 2017-05-10 NOTE — Progress Notes (Deleted)
Office Visit Note  Patient: Haley Goodman             Date of Birth: 01-26-1981           MRN: [id number]             PCP: Avon Gully, MD Referring: Avon Gully, MD Visit Date: 05/17/2017 Occupation: @GUAROCC @    Subjective:  No chief complaint on file.   History of Present Illness: Haley Goodman is a 37 y.o. female ***   Activities of Daily Living:  Patient reports morning stiffness for *** {minute/hour:19697}.   Patient {ACTIONS;DENIES/REPORTS:21021675::"Denies"} nocturnal pain.  Difficulty dressing/grooming: {ACTIONS;DENIES/REPORTS:21021675::"Denies"} Difficulty climbing stairs: {ACTIONS;DENIES/REPORTS:21021675::"Denies"} Difficulty getting out of chair: {ACTIONS;DENIES/REPORTS:21021675::"Denies"} Difficulty using hands for taps, buttons, cutlery, and/or writing: {ACTIONS;DENIES/REPORTS:21021675::"Denies"}   No Rheumatology ROS completed.   PMFS History:  Patient Active Problem List   Diagnosis Date Noted  . MDD (major depressive disorder), recurrent episode, moderate (HCC) 03/22/2017  . Encounter for therapeutic drug monitoring 01/11/2017  . Rheumatoid arthritis involving multiple sites with positive rheumatoid factor (HCC) 05/10/2016  . Fibromyalgia 05/10/2016  . Primary osteoarthritis of both hands 05/10/2016  . Chronic pain syndrome/ in pain manaement 05/10/2016  . High risk medication use/ Plaquenil 05/10/2016  . Spondylosis of lumbar region without myelopathy or radiculopathy 05/10/2016  . Abnormal serum protein electrophoresis 05/10/2016  . Vitamin D deficiency 05/10/2016  . Uncontrolled type 2 diabetes mellitus with complication, without long-term current use of insulin (HCC) 03/13/2016  . Essential hypertension, benign 03/13/2016  . Morbid obesity (HCC) 03/13/2016  . Pulmonary embolism (HCC) 06/06/2011    Class: History of    Past Medical History:  Diagnosis Date  . Anemia   . Arthritis   . Fibromyalgia   . Hypertension   .  Lupus   . PE (pulmonary embolism) 2009  . RA (rheumatoid arthritis) (HCC)     Family History  Problem Relation Age of Onset  . Stroke Mother   . Cancer Other   . Diabetes Other   . Pseudochol deficiency Neg Hx   . Malignant hyperthermia Neg Hx   . Hypotension Neg Hx   . Anesthesia problems Neg Hx    Past Surgical History:  Procedure Laterality Date  . ABLATION    . CHOLECYSTECTOMY    . DILATION AND CURETTAGE OF UTERUS    . TUBAL LIGATION     Social History   Social History Narrative  . Not on file     Objective: Vital Signs: There were no vitals taken for this visit.   Physical Exam   Musculoskeletal Exam: ***  CDAI Exam: No CDAI exam completed.    Investigation: No additional findings. CBC Latest Ref Rng & Units 11/30/2016 02/17/2015 11/02/2013  WBC 3.8 - 10.8 Thousand/uL 8.8 6.1 -  Hemoglobin 11.7 - 15.5 g/dL 01/02/2014 11.0(L) 14.3  Hematocrit 35.0 - 45.0 % 36.1 34.6(L) 42.0  Platelets 140 - 400 Thousand/uL 247 235 -   CMP Latest Ref Rng & Units 11/30/2016 02/17/2015 11/02/2013  Glucose 65 - 99 mg/dL 01/02/2014) 160(V) 371(G)  BUN 7 - 25 mg/dL 8 12 10   Creatinine 0.50 - 1.10 mg/dL 626(R ) 4.85  Sodium 135 - 146 mmol/L 134(L) 135 135(L)  Potassium 3.5 - 5.3 mmol/L 4.6 4.0 4.0  Chloride 98 - 110 mmol/L 99 108 101  CO2 20 - 32 mmol/L 29 20(L) -  Calcium 8.6 - 10.2 mg/dL 8.8 4.62(V) -  Total Protein 6.1 - 8.1 g/dL 7.5 - -  Total Bilirubin 0.2 -  1.2 mg/dL 0.3 - -  Alkaline Phos 39 - 117 U/L - - -  AST 10 - 30 U/L 16 - -  ALT 6 - 29 U/L 18 - -    Imaging: No results found.  Speciality Comments: No specialty comments available.    Procedures:  No procedures performed Allergies: Codeine; Compazine; Ivp dye [iodinated diagnostic agents]; Lisinopril; Nitrofurantoin monohyd macro; Penicillins; and Sulfasalazine   Assessment / Plan:     Visit Diagnoses: Rheumatoid arthritis involving multiple sites with positive rheumatoid factor (HCC)  High risk medication use/  Plaquenil - PLQ 200 mg BIDPLQ eye exam:CBC/CMP:  Primary osteoarthritis of both hands  Spondylosis of lumbar region without myelopathy or radiculopathy  Vitamin D deficiency - on Supplement  Fibromyalgia - Cymbalta, Zanaflex  Chronic pain syndrome/ in pain manaement  Uncontrolled type 2 diabetes mellitus with complication, without long-term current use of insulin (HCC)  Essential hypertension, benign  History of pulmonary embolism  MDD (major depressive disorder), recurrent episode, moderate (HCC)    Orders: No orders of the defined types were placed in this encounter.  No orders of the defined types were placed in this encounter.   Face-to-face time spent with patient was *** minutes. 50% of time was spent in counseling and coordination of care.  Follow-Up Instructions: No Follow-up on file.   Gearldine Bienenstock, PA-C  Note - This record has been created using Dragon software.  Chart creation errors have been sought, but may not always  have been located. Such creation errors do not reflect on  the standard of medical care.

## 2017-05-14 DIAGNOSIS — G894 Chronic pain syndrome: Secondary | ICD-10-CM | POA: Diagnosis not present

## 2017-05-14 DIAGNOSIS — M797 Fibromyalgia: Secondary | ICD-10-CM | POA: Diagnosis not present

## 2017-05-14 DIAGNOSIS — L930 Discoid lupus erythematosus: Secondary | ICD-10-CM | POA: Diagnosis not present

## 2017-05-14 DIAGNOSIS — Z794 Long term (current) use of insulin: Secondary | ICD-10-CM | POA: Diagnosis not present

## 2017-05-14 DIAGNOSIS — E119 Type 2 diabetes mellitus without complications: Secondary | ICD-10-CM | POA: Diagnosis not present

## 2017-05-15 ENCOUNTER — Ambulatory Visit (INDEPENDENT_AMBULATORY_CARE_PROVIDER_SITE_OTHER): Payer: Medicare Other | Admitting: *Deleted

## 2017-05-15 DIAGNOSIS — Z5181 Encounter for therapeutic drug level monitoring: Secondary | ICD-10-CM | POA: Diagnosis not present

## 2017-05-15 DIAGNOSIS — I2699 Other pulmonary embolism without acute cor pulmonale: Secondary | ICD-10-CM | POA: Diagnosis not present

## 2017-05-15 LAB — POCT INR: INR: 1.8

## 2017-05-15 NOTE — Patient Instructions (Signed)
Take coumadin 1 1/2 tablets tomorrow night then resume 1 tablet daily except 1/2 tablet on Wednesdays. Recheck in 3 weeks

## 2017-05-17 ENCOUNTER — Ambulatory Visit: Payer: Medicare Other | Admitting: Physician Assistant

## 2017-05-28 IMAGING — CT CT ANGIO CHEST
1 of 6 series · 5 of 36 positions shown · IV contrast (Omnipaque 300)
Comparison: 12/02/2014

CLINICAL DATA: Central chest pain tonight. History of PE

EXAM:
CT ANGIOGRAPHY CHEST WITH CONTRAST
TECHNIQUE: Multidetector CT imaging of the chest was performed using the
standard protocol during bolus administration of intravenous
contrast. Multiplanar CT image reconstructions and MIPs were
obtained to evaluate the vascular anatomy.
CONTRAST:  100mL OMNIPAQUE IOHEXOL 350 MG/ML SOLN

[Series 4: pe 3.0 b40f · axial · 0.59mm/px · z∈[-189,-42]mm · 5 of 75 slices shown]
[im 13/75  lung]
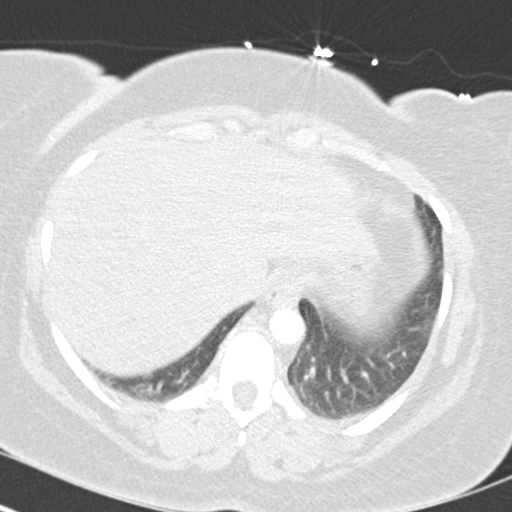
[im 25/75  mediastinal]
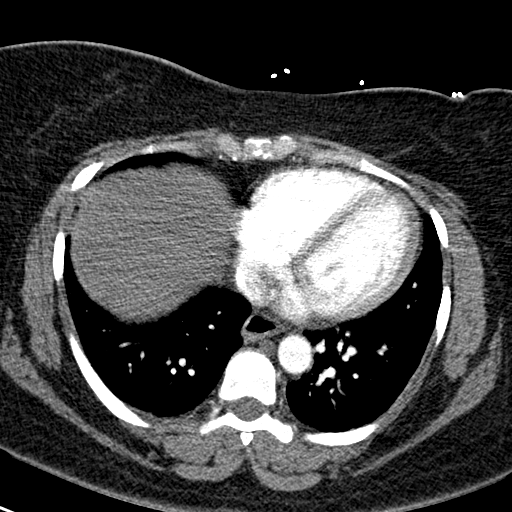
[im 38/75  lung]
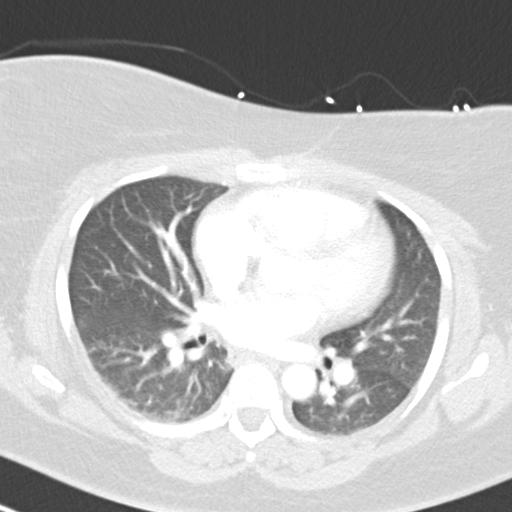
[im 50/75  mediastinal]
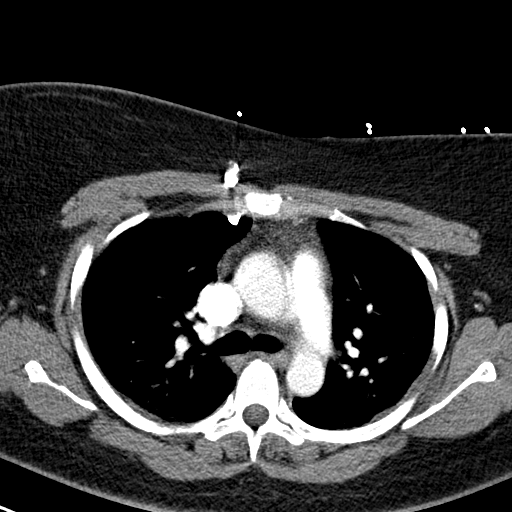
[im 62/75  lung]
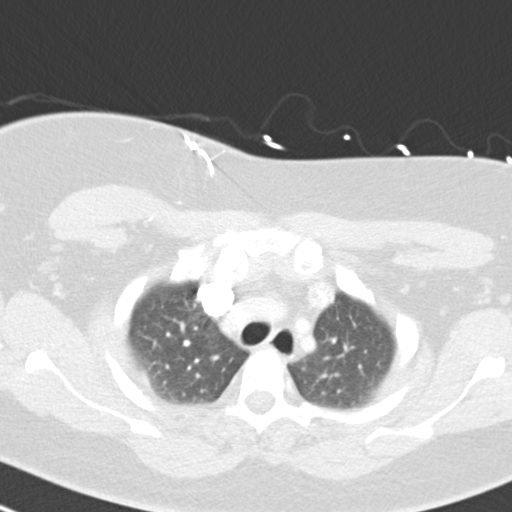

[5 of 36 positions shown; findings below may reference images not displayed]

FINDINGS: THORACIC INLET/BODY WALL:

No acute abnormality.

MEDIASTINUM:

Normal heart size. No pericardial effusion. Intermittent motion
degradation but overall diagnostic and negative for pulmonary
embolism. Negative thoracic aorta.

LUNG WINDOWS:

There is no edema, consolidation, effusion, or pneumothorax.

UPPER ABDOMEN:

Probable steatosis of the liver, certainty limited by contrast
timing.

OSSEOUS:

No acute fracture.  No suspicious lytic or blastic lesions.

Review of the MIP images confirms the above findings.
IMPRESSION: No evidence of pulmonary embolism or other acute finding.

## 2017-05-30 ENCOUNTER — Ambulatory Visit: Payer: Medicare Other | Admitting: Gastroenterology

## 2017-05-30 ENCOUNTER — Encounter: Payer: Self-pay | Admitting: Internal Medicine

## 2017-05-30 ENCOUNTER — Telehealth: Payer: Self-pay | Admitting: Gastroenterology

## 2017-05-30 NOTE — Telephone Encounter (Signed)
PATIENT WAS A NO SHOW AND LETTER SENT  °

## 2017-06-01 DIAGNOSIS — G4700 Insomnia, unspecified: Secondary | ICD-10-CM | POA: Diagnosis not present

## 2017-06-01 DIAGNOSIS — M791 Myalgia, unspecified site: Secondary | ICD-10-CM | POA: Diagnosis not present

## 2017-06-01 DIAGNOSIS — M329 Systemic lupus erythematosus, unspecified: Secondary | ICD-10-CM | POA: Diagnosis not present

## 2017-06-01 DIAGNOSIS — Z79891 Long term (current) use of opiate analgesic: Secondary | ICD-10-CM | POA: Diagnosis not present

## 2017-06-01 DIAGNOSIS — M545 Low back pain: Secondary | ICD-10-CM | POA: Diagnosis not present

## 2017-06-09 DIAGNOSIS — I10 Essential (primary) hypertension: Secondary | ICD-10-CM | POA: Diagnosis not present

## 2017-06-09 DIAGNOSIS — Z79899 Other long term (current) drug therapy: Secondary | ICD-10-CM | POA: Diagnosis not present

## 2017-06-09 DIAGNOSIS — Z794 Long term (current) use of insulin: Secondary | ICD-10-CM | POA: Diagnosis not present

## 2017-06-09 DIAGNOSIS — J209 Acute bronchitis, unspecified: Secondary | ICD-10-CM | POA: Diagnosis not present

## 2017-06-09 DIAGNOSIS — M797 Fibromyalgia: Secondary | ICD-10-CM | POA: Diagnosis not present

## 2017-06-09 DIAGNOSIS — M329 Systemic lupus erythematosus, unspecified: Secondary | ICD-10-CM | POA: Diagnosis not present

## 2017-06-09 DIAGNOSIS — Z7901 Long term (current) use of anticoagulants: Secondary | ICD-10-CM | POA: Diagnosis not present

## 2017-06-09 DIAGNOSIS — M069 Rheumatoid arthritis, unspecified: Secondary | ICD-10-CM | POA: Diagnosis not present

## 2017-06-11 ENCOUNTER — Ambulatory Visit (HOSPITAL_COMMUNITY): Payer: Medicare Other | Admitting: Licensed Clinical Social Worker

## 2017-06-13 DIAGNOSIS — Z794 Long term (current) use of insulin: Secondary | ICD-10-CM | POA: Diagnosis not present

## 2017-06-13 DIAGNOSIS — E119 Type 2 diabetes mellitus without complications: Secondary | ICD-10-CM | POA: Diagnosis not present

## 2017-06-13 DIAGNOSIS — J40 Bronchitis, not specified as acute or chronic: Secondary | ICD-10-CM | POA: Diagnosis not present

## 2017-06-17 DIAGNOSIS — Z79899 Other long term (current) drug therapy: Secondary | ICD-10-CM | POA: Diagnosis not present

## 2017-06-17 DIAGNOSIS — M797 Fibromyalgia: Secondary | ICD-10-CM | POA: Diagnosis not present

## 2017-06-17 DIAGNOSIS — Z7901 Long term (current) use of anticoagulants: Secondary | ICD-10-CM | POA: Diagnosis not present

## 2017-06-17 DIAGNOSIS — M329 Systemic lupus erythematosus, unspecified: Secondary | ICD-10-CM | POA: Diagnosis not present

## 2017-06-17 DIAGNOSIS — J069 Acute upper respiratory infection, unspecified: Secondary | ICD-10-CM | POA: Diagnosis not present

## 2017-06-17 DIAGNOSIS — M069 Rheumatoid arthritis, unspecified: Secondary | ICD-10-CM | POA: Diagnosis not present

## 2017-06-17 DIAGNOSIS — R05 Cough: Secondary | ICD-10-CM | POA: Diagnosis not present

## 2017-06-17 DIAGNOSIS — Z794 Long term (current) use of insulin: Secondary | ICD-10-CM | POA: Diagnosis not present

## 2017-06-17 DIAGNOSIS — I10 Essential (primary) hypertension: Secondary | ICD-10-CM | POA: Diagnosis not present
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
## 2017-06-20 ENCOUNTER — Ambulatory Visit (HOSPITAL_COMMUNITY): Payer: Medicare Other | Admitting: Psychiatry

## 2017-06-25 DIAGNOSIS — I10 Essential (primary) hypertension: Secondary | ICD-10-CM | POA: Diagnosis not present

## 2017-06-25 DIAGNOSIS — M797 Fibromyalgia: Secondary | ICD-10-CM | POA: Diagnosis not present

## 2017-06-25 DIAGNOSIS — Z794 Long term (current) use of insulin: Secondary | ICD-10-CM | POA: Diagnosis not present

## 2017-06-25 DIAGNOSIS — M069 Rheumatoid arthritis, unspecified: Secondary | ICD-10-CM | POA: Diagnosis not present

## 2017-06-25 DIAGNOSIS — Z833 Family history of diabetes mellitus: Secondary | ICD-10-CM | POA: Diagnosis not present

## 2017-06-25 DIAGNOSIS — E119 Type 2 diabetes mellitus without complications: Secondary | ICD-10-CM | POA: Diagnosis not present

## 2017-06-25 DIAGNOSIS — Z86711 Personal history of pulmonary embolism: Secondary | ICD-10-CM | POA: Diagnosis not present

## 2017-06-25 DIAGNOSIS — Z79899 Other long term (current) drug therapy: Secondary | ICD-10-CM | POA: Diagnosis not present

## 2017-06-25 DIAGNOSIS — Z7901 Long term (current) use of anticoagulants: Secondary | ICD-10-CM | POA: Diagnosis not present

## 2017-06-25 DIAGNOSIS — J209 Acute bronchitis, unspecified: Secondary | ICD-10-CM | POA: Diagnosis not present

## 2017-06-25 DIAGNOSIS — R0602 Shortness of breath: Secondary | ICD-10-CM | POA: Diagnosis not present

## 2017-06-25 DIAGNOSIS — Z8249 Family history of ischemic heart disease and other diseases of the circulatory system: Secondary | ICD-10-CM | POA: Diagnosis not present

## 2017-06-26 ENCOUNTER — Ambulatory Visit (INDEPENDENT_AMBULATORY_CARE_PROVIDER_SITE_OTHER): Payer: Medicare Other | Admitting: *Deleted

## 2017-06-26 ENCOUNTER — Other Ambulatory Visit (HOSPITAL_COMMUNITY): Payer: Self-pay | Admitting: Psychiatry

## 2017-06-26 DIAGNOSIS — I2699 Other pulmonary embolism without acute cor pulmonale: Secondary | ICD-10-CM | POA: Diagnosis not present

## 2017-06-26 DIAGNOSIS — R0602 Shortness of breath: Secondary | ICD-10-CM | POA: Diagnosis not present

## 2017-06-26 DIAGNOSIS — Z5181 Encounter for therapeutic drug level monitoring: Secondary | ICD-10-CM

## 2017-06-26 LAB — POCT INR: INR: 1.5

## 2017-06-26 MED ORDER — ZIPRASIDONE HCL 40 MG PO CAPS: 40.0000 mg | ORAL_CAPSULE | Freq: Every day | ORAL | 0 refills | Status: DC

## 2017-06-26 NOTE — Patient Instructions (Signed)
Continue coumadin 1 tablet daily except 1/2 tablet on Wednesdays until INR appt on 07/05/17 Starting Levaquin 1 tablet daily x 10 days Will increase coumadin dose after pt finishes Levaquin

## 2017-06-28 DIAGNOSIS — G4700 Insomnia, unspecified: Secondary | ICD-10-CM | POA: Diagnosis not present

## 2017-06-28 DIAGNOSIS — M545 Low back pain: Secondary | ICD-10-CM | POA: Diagnosis not present

## 2017-06-28 DIAGNOSIS — G894 Chronic pain syndrome: Secondary | ICD-10-CM | POA: Diagnosis not present

## 2017-06-28 DIAGNOSIS — M791 Myalgia, unspecified site: Secondary | ICD-10-CM | POA: Diagnosis not present

## 2017-06-28 DIAGNOSIS — Z79899 Other long term (current) drug therapy: Secondary | ICD-10-CM | POA: Diagnosis not present

## 2017-06-28 DIAGNOSIS — M329 Systemic lupus erythematosus, unspecified: Secondary | ICD-10-CM | POA: Diagnosis not present

## 2017-06-28 DIAGNOSIS — Z79891 Long term (current) use of opiate analgesic: Secondary | ICD-10-CM | POA: Diagnosis not present

## 2017-07-03 DIAGNOSIS — Z79899 Other long term (current) drug therapy: Secondary | ICD-10-CM | POA: Diagnosis not present

## 2017-07-05 ENCOUNTER — Ambulatory Visit (INDEPENDENT_AMBULATORY_CARE_PROVIDER_SITE_OTHER): Payer: Medicare Other | Admitting: *Deleted

## 2017-07-05 DIAGNOSIS — Z5181 Encounter for therapeutic drug level monitoring: Secondary | ICD-10-CM | POA: Diagnosis not present

## 2017-07-05 DIAGNOSIS — I2699 Other pulmonary embolism without acute cor pulmonale: Secondary | ICD-10-CM | POA: Diagnosis not present

## 2017-07-05 LAB — POCT INR: INR: 3.9

## 2017-07-05 NOTE — Patient Instructions (Signed)
Hold coumadin tonight then increase dose to 1 tablet daily (INR had been low before starting levaquin. ) INR appt on 07/12/17
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
## 2017-07-10 ENCOUNTER — Encounter (HOSPITAL_COMMUNITY): Payer: Self-pay | Admitting: Psychiatry

## 2017-07-10 ENCOUNTER — Ambulatory Visit (INDEPENDENT_AMBULATORY_CARE_PROVIDER_SITE_OTHER): Payer: Medicare Other | Admitting: Psychiatry

## 2017-07-10 VITALS — BP 122/83 | HR 90 | Ht 67.0 in | Wt 243.0 lb

## 2017-07-10 DIAGNOSIS — G4700 Insomnia, unspecified: Secondary | ICD-10-CM

## 2017-07-10 DIAGNOSIS — R450 Nervousness: Secondary | ICD-10-CM

## 2017-07-10 DIAGNOSIS — F331 Major depressive disorder, recurrent, moderate: Secondary | ICD-10-CM | POA: Diagnosis not present

## 2017-07-10 DIAGNOSIS — Z62810 Personal history of physical and sexual abuse in childhood: Secondary | ICD-10-CM | POA: Diagnosis not present

## 2017-07-10 DIAGNOSIS — G8929 Other chronic pain: Secondary | ICD-10-CM

## 2017-07-10 DIAGNOSIS — Z560 Unemployment, unspecified: Secondary | ICD-10-CM | POA: Diagnosis not present

## 2017-07-10 DIAGNOSIS — F419 Anxiety disorder, unspecified: Secondary | ICD-10-CM | POA: Diagnosis not present

## 2017-07-10 MED ORDER — LORAZEPAM 1 MG PO TABS: 1.0000 mg | ORAL_TABLET | Freq: Two times a day (BID) | ORAL | 2 refills | Status: DC | PRN

## 2017-07-10 MED ORDER — DULOXETINE HCL 60 MG PO CPEP: 60.0000 mg | ORAL_CAPSULE | Freq: Two times a day (BID) | ORAL | 0 refills | Status: DC

## 2017-07-10 MED ORDER — ZIPRASIDONE HCL 40 MG PO CAPS: 40.0000 mg | ORAL_CAPSULE | Freq: Every day | ORAL | 0 refills | Status: DC

## 2017-07-10 NOTE — Patient Instructions (Signed)
1. Continue duloxetine 60 mg twice a day  2. Continue Geodon 40 mg daily  3. Continue Ativan 1 mg twice a day as needed for anxiety  4. Return to clinic in three months for 15 mins

## 2017-07-15 DIAGNOSIS — Z9049 Acquired absence of other specified parts of digestive tract: Secondary | ICD-10-CM | POA: Diagnosis not present

## 2017-07-15 DIAGNOSIS — R1030 Lower abdominal pain, unspecified: Secondary | ICD-10-CM | POA: Diagnosis not present

## 2017-07-15 DIAGNOSIS — M069 Rheumatoid arthritis, unspecified: Secondary | ICD-10-CM | POA: Diagnosis not present

## 2017-07-15 DIAGNOSIS — M329 Systemic lupus erythematosus, unspecified: Secondary | ICD-10-CM | POA: Diagnosis not present

## 2017-07-15 DIAGNOSIS — R350 Frequency of micturition: Secondary | ICD-10-CM | POA: Diagnosis not present

## 2017-07-15 DIAGNOSIS — Z79899 Other long term (current) drug therapy: Secondary | ICD-10-CM | POA: Diagnosis not present

## 2017-07-15 DIAGNOSIS — R300 Dysuria: Secondary | ICD-10-CM | POA: Diagnosis not present

## 2017-07-15 DIAGNOSIS — K5900 Constipation, unspecified: Secondary | ICD-10-CM | POA: Diagnosis not present

## 2017-07-15 DIAGNOSIS — Z794 Long term (current) use of insulin: Secondary | ICD-10-CM | POA: Diagnosis not present

## 2017-07-15 DIAGNOSIS — M797 Fibromyalgia: Secondary | ICD-10-CM | POA: Diagnosis not present

## 2017-07-15 DIAGNOSIS — R319 Hematuria, unspecified: Secondary | ICD-10-CM | POA: Diagnosis not present

## 2017-07-15 DIAGNOSIS — M545 Low back pain: Secondary | ICD-10-CM | POA: Diagnosis not present

## 2017-07-15 DIAGNOSIS — Z7901 Long term (current) use of anticoagulants: Secondary | ICD-10-CM | POA: Diagnosis not present

## 2017-07-15 DIAGNOSIS — Z9851 Tubal ligation status: Secondary | ICD-10-CM | POA: Diagnosis not present

## 2017-07-15 DIAGNOSIS — I10 Essential (primary) hypertension: Secondary | ICD-10-CM | POA: Diagnosis not present

## 2017-07-15 DIAGNOSIS — R109 Unspecified abdominal pain: Secondary | ICD-10-CM | POA: Diagnosis not present

## 2017-07-16 DIAGNOSIS — R109 Unspecified abdominal pain: Secondary | ICD-10-CM | POA: Diagnosis not present

## 2017-07-25 ENCOUNTER — Ambulatory Visit (HOSPITAL_COMMUNITY): Payer: Medicare Other | Admitting: Licensed Clinical Social Worker

## 2017-07-28 DIAGNOSIS — M069 Rheumatoid arthritis, unspecified: Secondary | ICD-10-CM | POA: Diagnosis not present

## 2017-07-28 DIAGNOSIS — I10 Essential (primary) hypertension: Secondary | ICD-10-CM | POA: Diagnosis not present

## 2017-07-28 DIAGNOSIS — G43011 Migraine without aura, intractable, with status migrainosus: Secondary | ICD-10-CM | POA: Diagnosis not present

## 2017-07-28 DIAGNOSIS — Z794 Long term (current) use of insulin: Secondary | ICD-10-CM | POA: Diagnosis not present

## 2017-07-28 DIAGNOSIS — Z79899 Other long term (current) drug therapy: Secondary | ICD-10-CM | POA: Diagnosis not present

## 2017-07-28 DIAGNOSIS — Z7901 Long term (current) use of anticoagulants: Secondary | ICD-10-CM | POA: Diagnosis not present

## 2017-07-28 DIAGNOSIS — M329 Systemic lupus erythematosus, unspecified: Secondary | ICD-10-CM | POA: Diagnosis not present

## 2017-07-28 DIAGNOSIS — M797 Fibromyalgia: Secondary | ICD-10-CM | POA: Diagnosis not present

## 2017-07-31 DIAGNOSIS — M329 Systemic lupus erythematosus, unspecified: Secondary | ICD-10-CM | POA: Diagnosis not present

## 2017-07-31 DIAGNOSIS — Z79899 Other long term (current) drug therapy: Secondary | ICD-10-CM | POA: Diagnosis not present

## 2017-07-31 DIAGNOSIS — R51 Headache: Secondary | ICD-10-CM | POA: Diagnosis not present

## 2017-07-31 DIAGNOSIS — I10 Essential (primary) hypertension: Secondary | ICD-10-CM | POA: Diagnosis not present

## 2017-07-31 DIAGNOSIS — M797 Fibromyalgia: Secondary | ICD-10-CM | POA: Diagnosis not present

## 2017-07-31 DIAGNOSIS — I6203 Nontraumatic chronic subdural hemorrhage: Secondary | ICD-10-CM | POA: Diagnosis not present

## 2017-07-31 DIAGNOSIS — R000 Tachycardia, unspecified: Secondary | ICD-10-CM | POA: Diagnosis not present

## 2017-07-31 DIAGNOSIS — I6201 Nontraumatic acute subdural hemorrhage: Secondary | ICD-10-CM | POA: Diagnosis not present

## 2017-07-31 DIAGNOSIS — Z794 Long term (current) use of insulin: Secondary | ICD-10-CM | POA: Diagnosis not present

## 2017-07-31 DIAGNOSIS — S065X0A Traumatic subdural hemorrhage without loss of consciousness, initial encounter: Secondary | ICD-10-CM | POA: Diagnosis not present

## 2017-07-31 DIAGNOSIS — M069 Rheumatoid arthritis, unspecified: Secondary | ICD-10-CM | POA: Diagnosis not present

## 2017-07-31 DIAGNOSIS — W1839XA Other fall on same level, initial encounter: Secondary | ICD-10-CM | POA: Diagnosis not present

## 2017-07-31 DIAGNOSIS — R569 Unspecified convulsions: Secondary | ICD-10-CM | POA: Diagnosis not present

## 2017-07-31 DIAGNOSIS — I6200 Nontraumatic subdural hemorrhage, unspecified: Secondary | ICD-10-CM | POA: Diagnosis not present

## 2017-07-31 DIAGNOSIS — Z7901 Long term (current) use of anticoagulants: Secondary | ICD-10-CM | POA: Diagnosis not present

## 2017-07-31 DIAGNOSIS — E1165 Type 2 diabetes mellitus with hyperglycemia: Secondary | ICD-10-CM | POA: Diagnosis not present

## 2017-07-31 DIAGNOSIS — W19XXXA Unspecified fall, initial encounter: Secondary | ICD-10-CM | POA: Diagnosis not present

## 2017-08-01 DIAGNOSIS — T50995A Adverse effect of other drugs, medicaments and biological substances, initial encounter: Secondary | ICD-10-CM | POA: Diagnosis not present

## 2017-08-01 DIAGNOSIS — Z882 Allergy status to sulfonamides status: Secondary | ICD-10-CM | POA: Diagnosis not present

## 2017-08-01 DIAGNOSIS — E1165 Type 2 diabetes mellitus with hyperglycemia: Secondary | ICD-10-CM | POA: Diagnosis not present

## 2017-08-01 DIAGNOSIS — I10 Essential (primary) hypertension: Secondary | ICD-10-CM | POA: Diagnosis not present

## 2017-08-01 DIAGNOSIS — R569 Unspecified convulsions: Secondary | ICD-10-CM | POA: Diagnosis not present

## 2017-08-01 DIAGNOSIS — G4089 Other seizures: Secondary | ICD-10-CM | POA: Diagnosis present

## 2017-08-01 DIAGNOSIS — I517 Cardiomegaly: Secondary | ICD-10-CM | POA: Diagnosis not present

## 2017-08-01 DIAGNOSIS — Z6837 Body mass index (BMI) 37.0-37.9, adult: Secondary | ICD-10-CM | POA: Diagnosis not present

## 2017-08-01 DIAGNOSIS — M797 Fibromyalgia: Secondary | ICD-10-CM | POA: Diagnosis present

## 2017-08-01 DIAGNOSIS — T45515A Adverse effect of anticoagulants, initial encounter: Secondary | ICD-10-CM | POA: Diagnosis present

## 2017-08-01 DIAGNOSIS — Z79899 Other long term (current) drug therapy: Secondary | ICD-10-CM | POA: Diagnosis not present

## 2017-08-01 DIAGNOSIS — R400 Somnolence: Secondary | ICD-10-CM | POA: Diagnosis not present

## 2017-08-01 DIAGNOSIS — R791 Abnormal coagulation profile: Secondary | ICD-10-CM | POA: Diagnosis not present

## 2017-08-01 DIAGNOSIS — M329 Systemic lupus erythematosus, unspecified: Secondary | ICD-10-CM | POA: Diagnosis not present

## 2017-08-01 DIAGNOSIS — S065X9A Traumatic subdural hemorrhage with loss of consciousness of unspecified duration, initial encounter: Secondary | ICD-10-CM | POA: Diagnosis not present

## 2017-08-01 DIAGNOSIS — G8191 Hemiplegia, unspecified affecting right dominant side: Secondary | ICD-10-CM | POA: Diagnosis present

## 2017-08-01 DIAGNOSIS — I6201 Nontraumatic acute subdural hemorrhage: Secondary | ICD-10-CM | POA: Diagnosis not present

## 2017-08-01 DIAGNOSIS — D6862 Lupus anticoagulant syndrome: Secondary | ICD-10-CM | POA: Diagnosis present

## 2017-08-01 DIAGNOSIS — L930 Discoid lupus erythematosus: Secondary | ICD-10-CM | POA: Diagnosis present

## 2017-08-01 DIAGNOSIS — Z881 Allergy status to other antibiotic agents status: Secondary | ICD-10-CM | POA: Diagnosis not present

## 2017-08-01 DIAGNOSIS — J45909 Unspecified asthma, uncomplicated: Secondary | ICD-10-CM | POA: Diagnosis present

## 2017-08-01 DIAGNOSIS — Z888 Allergy status to other drugs, medicaments and biological substances status: Secondary | ICD-10-CM | POA: Diagnosis not present

## 2017-08-01 DIAGNOSIS — I6203 Nontraumatic chronic subdural hemorrhage: Secondary | ICD-10-CM | POA: Diagnosis not present

## 2017-08-01 DIAGNOSIS — K5903 Drug induced constipation: Secondary | ICD-10-CM | POA: Diagnosis not present

## 2017-08-01 DIAGNOSIS — S065X0A Traumatic subdural hemorrhage without loss of consciousness, initial encounter: Secondary | ICD-10-CM | POA: Diagnosis not present

## 2017-08-01 DIAGNOSIS — I6200 Nontraumatic subdural hemorrhage, unspecified: Secondary | ICD-10-CM | POA: Diagnosis not present

## 2017-08-01 DIAGNOSIS — Z7901 Long term (current) use of anticoagulants: Secondary | ICD-10-CM | POA: Diagnosis not present

## 2017-08-01 DIAGNOSIS — G43909 Migraine, unspecified, not intractable, without status migrainosus: Secondary | ICD-10-CM | POA: Diagnosis present

## 2017-08-01 DIAGNOSIS — K219 Gastro-esophageal reflux disease without esophagitis: Secondary | ICD-10-CM | POA: Diagnosis present

## 2017-08-01 DIAGNOSIS — Z982 Presence of cerebrospinal fluid drainage device: Secondary | ICD-10-CM | POA: Diagnosis not present

## 2017-08-01 DIAGNOSIS — R51 Headache: Secondary | ICD-10-CM | POA: Diagnosis not present

## 2017-08-01 DIAGNOSIS — K5900 Constipation, unspecified: Secondary | ICD-10-CM | POA: Diagnosis not present

## 2017-08-01 DIAGNOSIS — R0602 Shortness of breath: Secondary | ICD-10-CM | POA: Diagnosis not present

## 2017-08-01 DIAGNOSIS — G44311 Acute post-traumatic headache, intractable: Secondary | ICD-10-CM | POA: Diagnosis not present

## 2017-08-01 DIAGNOSIS — D649 Anemia, unspecified: Secondary | ICD-10-CM | POA: Diagnosis not present

## 2017-08-01 DIAGNOSIS — G9389 Other specified disorders of brain: Secondary | ICD-10-CM | POA: Diagnosis not present

## 2017-08-01 DIAGNOSIS — Z794 Long term (current) use of insulin: Secondary | ICD-10-CM | POA: Diagnosis not present

## 2017-08-01 DIAGNOSIS — E669 Obesity, unspecified: Secondary | ICD-10-CM | POA: Diagnosis not present

## 2017-08-01 DIAGNOSIS — D6832 Hemorrhagic disorder due to extrinsic circulating anticoagulants: Secondary | ICD-10-CM | POA: Diagnosis present

## 2017-08-01 DIAGNOSIS — G935 Compression of brain: Secondary | ICD-10-CM | POA: Diagnosis not present

## 2017-08-01 DIAGNOSIS — G9340 Encephalopathy, unspecified: Secondary | ICD-10-CM | POA: Diagnosis not present

## 2017-08-01 DIAGNOSIS — M5481 Occipital neuralgia: Secondary | ICD-10-CM | POA: Diagnosis not present

## 2017-08-01 DIAGNOSIS — M069 Rheumatoid arthritis, unspecified: Secondary | ICD-10-CM | POA: Diagnosis present

## 2017-08-01 DIAGNOSIS — Z9889 Other specified postprocedural states: Secondary | ICD-10-CM | POA: Diagnosis not present

## 2017-08-01 DIAGNOSIS — Z880 Allergy status to penicillin: Secondary | ICD-10-CM | POA: Diagnosis not present

## 2017-08-07 DIAGNOSIS — Z7901 Long term (current) use of anticoagulants: Secondary | ICD-10-CM

## 2017-08-07 HISTORY — DX: Long term (current) use of anticoagulants: Z79.01

## 2017-08-10 DIAGNOSIS — E119 Type 2 diabetes mellitus without complications: Secondary | ICD-10-CM | POA: Diagnosis not present

## 2017-08-10 DIAGNOSIS — D6862 Lupus anticoagulant syndrome: Secondary | ICD-10-CM | POA: Diagnosis not present

## 2017-08-10 DIAGNOSIS — M069 Rheumatoid arthritis, unspecified: Secondary | ICD-10-CM | POA: Diagnosis not present

## 2017-08-10 DIAGNOSIS — I6203 Nontraumatic chronic subdural hemorrhage: Secondary | ICD-10-CM | POA: Diagnosis not present

## 2017-08-10 DIAGNOSIS — Z7984 Long term (current) use of oral hypoglycemic drugs: Secondary | ICD-10-CM | POA: Diagnosis not present

## 2017-08-10 DIAGNOSIS — M797 Fibromyalgia: Secondary | ICD-10-CM | POA: Diagnosis not present

## 2017-08-10 DIAGNOSIS — G40909 Epilepsy, unspecified, not intractable, without status epilepticus: Secondary | ICD-10-CM | POA: Diagnosis not present

## 2017-08-13 ENCOUNTER — Telehealth: Payer: Self-pay | Admitting: Rheumatology

## 2017-08-13 ENCOUNTER — Telehealth (HOSPITAL_COMMUNITY): Payer: Self-pay | Admitting: Psychiatry

## 2017-08-13 DIAGNOSIS — M797 Fibromyalgia: Secondary | ICD-10-CM | POA: Diagnosis not present

## 2017-08-13 DIAGNOSIS — E119 Type 2 diabetes mellitus without complications: Secondary | ICD-10-CM | POA: Diagnosis not present

## 2017-08-13 DIAGNOSIS — M069 Rheumatoid arthritis, unspecified: Secondary | ICD-10-CM | POA: Diagnosis not present

## 2017-08-13 DIAGNOSIS — G40909 Epilepsy, unspecified, not intractable, without status epilepticus: Secondary | ICD-10-CM | POA: Diagnosis not present

## 2017-08-13 DIAGNOSIS — I6203 Nontraumatic chronic subdural hemorrhage: Secondary | ICD-10-CM | POA: Diagnosis not present

## 2017-08-13 DIAGNOSIS — D6862 Lupus anticoagulant syndrome: Secondary | ICD-10-CM | POA: Diagnosis not present
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
## 2017-08-14 DIAGNOSIS — D6862 Lupus anticoagulant syndrome: Secondary | ICD-10-CM | POA: Diagnosis not present

## 2017-08-14 DIAGNOSIS — M797 Fibromyalgia: Secondary | ICD-10-CM | POA: Diagnosis not present

## 2017-08-14 DIAGNOSIS — G40909 Epilepsy, unspecified, not intractable, without status epilepticus: Secondary | ICD-10-CM | POA: Diagnosis not present

## 2017-08-14 DIAGNOSIS — E119 Type 2 diabetes mellitus without complications: Secondary | ICD-10-CM | POA: Diagnosis not present

## 2017-08-14 DIAGNOSIS — M069 Rheumatoid arthritis, unspecified: Secondary | ICD-10-CM | POA: Diagnosis not present

## 2017-08-14 DIAGNOSIS — I6203 Nontraumatic chronic subdural hemorrhage: Secondary | ICD-10-CM | POA: Diagnosis not present

## 2017-08-16 DIAGNOSIS — E119 Type 2 diabetes mellitus without complications: Secondary | ICD-10-CM | POA: Diagnosis not present

## 2017-08-16 DIAGNOSIS — M797 Fibromyalgia: Secondary | ICD-10-CM | POA: Diagnosis not present

## 2017-08-16 DIAGNOSIS — M069 Rheumatoid arthritis, unspecified: Secondary | ICD-10-CM | POA: Diagnosis not present

## 2017-08-16 DIAGNOSIS — D6862 Lupus anticoagulant syndrome: Secondary | ICD-10-CM | POA: Diagnosis not present

## 2017-08-16 DIAGNOSIS — G40909 Epilepsy, unspecified, not intractable, without status epilepticus: Secondary | ICD-10-CM | POA: Diagnosis not present

## 2017-08-16 DIAGNOSIS — I6203 Nontraumatic chronic subdural hemorrhage: Secondary | ICD-10-CM | POA: Diagnosis not present

## 2017-08-21 DIAGNOSIS — M069 Rheumatoid arthritis, unspecified: Secondary | ICD-10-CM | POA: Diagnosis not present

## 2017-08-21 DIAGNOSIS — I6203 Nontraumatic chronic subdural hemorrhage: Secondary | ICD-10-CM | POA: Diagnosis not present

## 2017-08-21 DIAGNOSIS — E119 Type 2 diabetes mellitus without complications: Secondary | ICD-10-CM | POA: Diagnosis not present

## 2017-08-21 DIAGNOSIS — M797 Fibromyalgia: Secondary | ICD-10-CM | POA: Diagnosis not present

## 2017-08-21 DIAGNOSIS — G40909 Epilepsy, unspecified, not intractable, without status epilepticus: Secondary | ICD-10-CM | POA: Diagnosis not present

## 2017-08-21 DIAGNOSIS — D6862 Lupus anticoagulant syndrome: Secondary | ICD-10-CM | POA: Diagnosis not present

## 2017-08-23 DIAGNOSIS — E119 Type 2 diabetes mellitus without complications: Secondary | ICD-10-CM | POA: Diagnosis not present

## 2017-08-23 DIAGNOSIS — M797 Fibromyalgia: Secondary | ICD-10-CM | POA: Diagnosis not present

## 2017-08-23 DIAGNOSIS — G40909 Epilepsy, unspecified, not intractable, without status epilepticus: Secondary | ICD-10-CM | POA: Diagnosis not present

## 2017-08-23 DIAGNOSIS — M069 Rheumatoid arthritis, unspecified: Secondary | ICD-10-CM | POA: Diagnosis not present

## 2017-08-23 DIAGNOSIS — I6203 Nontraumatic chronic subdural hemorrhage: Secondary | ICD-10-CM | POA: Diagnosis not present

## 2017-08-23 DIAGNOSIS — D6862 Lupus anticoagulant syndrome: Secondary | ICD-10-CM | POA: Diagnosis not present

## 2017-09-04 DIAGNOSIS — Z79891 Long term (current) use of opiate analgesic: Secondary | ICD-10-CM | POA: Diagnosis not present

## 2017-09-04 DIAGNOSIS — G4700 Insomnia, unspecified: Secondary | ICD-10-CM | POA: Diagnosis not present

## 2017-09-04 DIAGNOSIS — S065X9A Traumatic subdural hemorrhage with loss of consciousness of unspecified duration, initial encounter: Secondary | ICD-10-CM | POA: Diagnosis not present

## 2017-09-04 DIAGNOSIS — M329 Systemic lupus erythematosus, unspecified: Secondary | ICD-10-CM | POA: Diagnosis not present

## 2017-09-04 DIAGNOSIS — M791 Myalgia, unspecified site: Secondary | ICD-10-CM | POA: Diagnosis not present

## 2017-09-10 DIAGNOSIS — S065X9A Traumatic subdural hemorrhage with loss of consciousness of unspecified duration, initial encounter: Secondary | ICD-10-CM | POA: Diagnosis not present

## 2017-09-27 DIAGNOSIS — N3000 Acute cystitis without hematuria: Secondary | ICD-10-CM | POA: Diagnosis not present

## 2017-09-27 DIAGNOSIS — R350 Frequency of micturition: Secondary | ICD-10-CM | POA: Diagnosis not present
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
## 2017-10-01 ENCOUNTER — Ambulatory Visit (HOSPITAL_COMMUNITY): Payer: Self-pay | Admitting: Psychiatry

## 2017-10-02 ENCOUNTER — Other Ambulatory Visit (HOSPITAL_COMMUNITY): Payer: Self-pay | Admitting: Psychiatry

## 2017-10-02 MED ORDER — ZIPRASIDONE HCL 40 MG PO CAPS: 40.0000 mg | ORAL_CAPSULE | Freq: Every day | ORAL | 0 refills | Status: DC

## 2017-10-02 MED ORDER — DULOXETINE HCL 60 MG PO CPEP: 60.0000 mg | ORAL_CAPSULE | Freq: Two times a day (BID) | ORAL | 0 refills | Status: DC

## 2017-10-03 DIAGNOSIS — Z111 Encounter for screening for respiratory tuberculosis: Secondary | ICD-10-CM | POA: Diagnosis not present

## 2017-10-08 ENCOUNTER — Telehealth (HOSPITAL_COMMUNITY): Payer: Self-pay | Admitting: *Deleted

## 2017-10-08 ENCOUNTER — Other Ambulatory Visit (HOSPITAL_COMMUNITY): Payer: Self-pay | Admitting: Psychiatry

## 2017-10-08 DIAGNOSIS — R51 Headache: Secondary | ICD-10-CM | POA: Diagnosis not present

## 2017-10-08 DIAGNOSIS — Z86711 Personal history of pulmonary embolism: Secondary | ICD-10-CM | POA: Diagnosis not present

## 2017-10-08 DIAGNOSIS — M545 Low back pain: Secondary | ICD-10-CM | POA: Diagnosis not present

## 2017-10-08 DIAGNOSIS — G44009 Cluster headache syndrome, unspecified, not intractable: Secondary | ICD-10-CM | POA: Diagnosis not present

## 2017-10-08 DIAGNOSIS — G4700 Insomnia, unspecified: Secondary | ICD-10-CM | POA: Diagnosis not present

## 2017-10-08 DIAGNOSIS — Z794 Long term (current) use of insulin: Secondary | ICD-10-CM | POA: Diagnosis not present

## 2017-10-08 DIAGNOSIS — M329 Systemic lupus erythematosus, unspecified: Secondary | ICD-10-CM | POA: Diagnosis not present

## 2017-10-08 DIAGNOSIS — I10 Essential (primary) hypertension: Secondary | ICD-10-CM | POA: Diagnosis not present

## 2017-10-08 DIAGNOSIS — M791 Myalgia, unspecified site: Secondary | ICD-10-CM | POA: Diagnosis not present

## 2017-10-08 DIAGNOSIS — Z79899 Other long term (current) drug therapy: Secondary | ICD-10-CM | POA: Diagnosis not present

## 2017-10-08 DIAGNOSIS — Z79891 Long term (current) use of opiate analgesic: Secondary | ICD-10-CM | POA: Diagnosis not present

## 2017-10-08 DIAGNOSIS — M797 Fibromyalgia: Secondary | ICD-10-CM | POA: Diagnosis not present

## 2017-10-08 DIAGNOSIS — Z7901 Long term (current) use of anticoagulants: Secondary | ICD-10-CM | POA: Diagnosis not present

## 2017-10-08 DIAGNOSIS — J302 Other seasonal allergic rhinitis: Secondary | ICD-10-CM | POA: Diagnosis not present

## 2017-10-08 DIAGNOSIS — M069 Rheumatoid arthritis, unspecified: Secondary | ICD-10-CM | POA: Diagnosis not present

## 2017-10-08 MED ORDER — DULOXETINE HCL 60 MG PO CPEP: 60.0000 mg | ORAL_CAPSULE | Freq: Two times a day (BID) | ORAL | 0 refills | Status: DC

## 2017-10-08 MED ORDER — LORAZEPAM 1 MG PO TABS: 1.0000 mg | ORAL_TABLET | Freq: Two times a day (BID) | ORAL | 0 refills | Status: DC | PRN

## 2017-10-08 NOTE — Telephone Encounter (Signed)
LVM per provider Dr Tenny Craw Covering Dr Vanetta Shawl Dr Tenny Craw 30 day supplies sent in per patient requesting d/c'd med refills

## 2017-10-08 NOTE — Telephone Encounter (Signed)
30 day supplies sent in

## 2017-10-08 NOTE — Telephone Encounter (Signed)
Dr Tenny Craw This is a patient of Dr Vanetta Shawl who left a message @ the front office requesting her discharged meds

## 2017-10-09 ENCOUNTER — Other Ambulatory Visit (HOSPITAL_COMMUNITY): Payer: Self-pay | Admitting: Psychiatry

## 2017-10-09 DIAGNOSIS — R51 Headache: Secondary | ICD-10-CM | POA: Diagnosis not present

## 2017-10-09 MED ORDER — ZIPRASIDONE HCL 20 MG PO CAPS: 20.0000 mg | ORAL_CAPSULE | Freq: Every day | ORAL | 0 refills | Status: DC

## 2017-10-15 ENCOUNTER — Ambulatory Visit (HOSPITAL_COMMUNITY): Payer: Self-pay | Admitting: Psychiatry

## 2017-10-17 DIAGNOSIS — J0100 Acute maxillary sinusitis, unspecified: Secondary | ICD-10-CM | POA: Diagnosis not present

## 2017-10-17 DIAGNOSIS — J301 Allergic rhinitis due to pollen: Secondary | ICD-10-CM | POA: Diagnosis not present

## 2017-10-17 DIAGNOSIS — F3177 Bipolar disorder, in partial remission, most recent episode mixed: Secondary | ICD-10-CM | POA: Diagnosis not present

## 2017-10-17 DIAGNOSIS — M797 Fibromyalgia: Secondary | ICD-10-CM | POA: Diagnosis not present

## 2017-11-08 DIAGNOSIS — Z79899 Other long term (current) drug therapy: Secondary | ICD-10-CM | POA: Insufficient documentation

## 2017-11-08 DIAGNOSIS — J209 Acute bronchitis, unspecified: Secondary | ICD-10-CM | POA: Diagnosis not present

## 2017-11-08 DIAGNOSIS — Z794 Long term (current) use of insulin: Secondary | ICD-10-CM | POA: Insufficient documentation

## 2017-11-08 DIAGNOSIS — E119 Type 2 diabetes mellitus without complications: Secondary | ICD-10-CM | POA: Diagnosis not present

## 2017-11-08 DIAGNOSIS — R509 Fever, unspecified: Secondary | ICD-10-CM | POA: Diagnosis present

## 2017-11-08 DIAGNOSIS — I10 Essential (primary) hypertension: Secondary | ICD-10-CM | POA: Insufficient documentation

## 2017-11-08 DIAGNOSIS — R0981 Nasal congestion: Secondary | ICD-10-CM | POA: Insufficient documentation

## 2017-11-08 DIAGNOSIS — Z7901 Long term (current) use of anticoagulants: Secondary | ICD-10-CM | POA: Diagnosis not present

## 2017-11-08 DIAGNOSIS — R070 Pain in throat: Secondary | ICD-10-CM | POA: Diagnosis not present

## 2017-11-08 DIAGNOSIS — R05 Cough: Secondary | ICD-10-CM | POA: Insufficient documentation

## 2017-11-09 ENCOUNTER — Emergency Department (HOSPITAL_COMMUNITY)
Admission: EM | Admit: 2017-11-09 | Discharge: 2017-11-09 | Disposition: A | Discharge status: Home / Self Care | Payer: Medicare Other | Attending: Emergency Medicine | Admitting: Emergency Medicine

## 2017-11-09 ENCOUNTER — Emergency Department (HOSPITAL_COMMUNITY): Payer: Medicare Other

## 2017-11-09 ENCOUNTER — Encounter (HOSPITAL_COMMUNITY): Payer: Self-pay

## 2017-11-09 DIAGNOSIS — J209 Acute bronchitis, unspecified: Secondary | ICD-10-CM

## 2017-11-09 DIAGNOSIS — R0981 Nasal congestion: Secondary | ICD-10-CM | POA: Diagnosis not present

## 2017-11-09 LAB — GROUP A STREP BY PCR: Group A Strep by PCR: NOT DETECTED

## 2017-11-09 LAB — INFLUENZA PANEL BY PCR (TYPE A & B)
INFLAPCR: NEGATIVE
Influenza B By PCR: NEGATIVE

## 2017-11-09 MED ORDER — OXYCODONE-ACETAMINOPHEN 5-325 MG PO TABS
1.0000 | ORAL_TABLET | Freq: Once | ORAL | Status: AC
  Administered 2017-11-09: 1 via ORAL
  Filled 2017-11-09: qty 1

## 2017-11-09 MED ORDER — DIPHENHYDRAMINE HCL 12.5 MG/5ML PO ELIX
12.5000 mg | ORAL_SOLUTION | Freq: Once | ORAL | Status: AC
  Administered 2017-11-09: 12.5 mg via ORAL
  Filled 2017-11-09: qty 5

## 2017-11-09 MED ORDER — BENZONATATE 100 MG PO CAPS: 100.0000 mg | ORAL_CAPSULE | Freq: Three times a day (TID) | ORAL | 0 refills | Status: DC

## 2017-11-09 MED ORDER — ALBUTEROL SULFATE (2.5 MG/3ML) 0.083% IN NEBU
2.5000 mg | INHALATION_SOLUTION | Freq: Once | RESPIRATORY_TRACT | Status: AC
  Administered 2017-11-09: 2.5 mg via RESPIRATORY_TRACT
  Filled 2017-11-09: qty 3

## 2017-11-09 MED ORDER — ALBUTEROL SULFATE HFA 108 (90 BASE) MCG/ACT IN AERS
2.0000 | INHALATION_SPRAY | Freq: Once | RESPIRATORY_TRACT | Status: AC
  Administered 2017-11-09: 2 via RESPIRATORY_TRACT
  Filled 2017-11-09: qty 6.7

## 2017-11-09 MED ORDER — AZITHROMYCIN 250 MG PO TABS: 250.0000 mg | ORAL_TABLET | Freq: Every day | ORAL | 0 refills | Status: DC

## 2017-11-09 MED ORDER — AZITHROMYCIN 250 MG PO TABS
500.0000 mg | ORAL_TABLET | Freq: Once | ORAL | Status: AC
  Administered 2017-11-09: 500 mg via ORAL
  Filled 2017-11-09: qty 2

## 2017-11-09 NOTE — ED Provider Notes (Signed)
[facility] EMERGENCY DEPARTMENT Provider Note   CSN: [id number] Arrival date & time: 11/08/17  2352     History   Chief Complaint Chief Complaint  Patient presents with  . Fever  . Cough    HPI Haley Goodman is a 37 y.o. female.  Patient is a 37 year old female who presents to the emergency department with a complaint of fever, cough, congestion.  The patient states that her problem started on Sunday, September 1 with congestion and sore throat.  She had a temperature elevation of 101.  She continued through the day to have aching.  Through the rest of the week she had more more aching, more more sore throat, and generally not feeling well.  There was no rash appreciated.  No vomiting.  In the last day or 2 the patient has had stiffness and tightness in the face accompanied by congestion and sinus pressure.  She has some difficulty with swallowing.  It is of note that both of her children are sick.  The patient presents now for assistance with this problem.  She is concerned because she has lupus, and she is also had "walking pneumonia".  The history is provided by the patient.  Fever   Associated symptoms include congestion, sore throat and cough. Pertinent negatives include no chest pain.  Cough  Associated symptoms include chills, sore throat and myalgias. Pertinent negatives include no chest pain, no shortness of breath and no wheezing.    Past Medical History:  Diagnosis Date  . Anemia   . Arthritis   . Fibromyalgia   . Hypertension   . Lupus (HCC)   . PE (pulmonary embolism) 2009  . RA (rheumatoid arthritis) [facility])     Patient Active Problem List   Diagnosis Date Noted  . MDD (major depressive disorder), recurrent episode, moderate (HCC) 03/22/2017  . Encounter for therapeutic drug monitoring 01/11/2017  . Rheumatoid arthritis involving multiple sites with positive rheumatoid factor (HCC) 05/10/2016  . Fibromyalgia 05/10/2016  . Primary osteoarthritis of  both hands 05/10/2016  . Chronic pain syndrome/ in pain manaement 05/10/2016  . High risk medication use/ Plaquenil 05/10/2016  . Spondylosis of lumbar region without myelopathy or radiculopathy 05/10/2016  . Abnormal serum protein electrophoresis 05/10/2016  . Vitamin D deficiency 05/10/2016  . Uncontrolled type 2 diabetes mellitus with complication, without long-term current use of insulin (HCC) 03/13/2016  . Essential hypertension, benign 03/13/2016  . Morbid obesity (HCC) 03/13/2016  . Pulmonary embolism (HCC) 06/06/2011    Class: History of    Past Surgical History:  Procedure Laterality Date  . ABLATION    . CHOLECYSTECTOMY    . DILATION AND CURETTAGE OF UTERUS    . TUBAL LIGATION       OB History    Gravida  4   Para  2   Term      Preterm  2   AB  2   Living  2     SAB      TAB      Ectopic      Multiple      Live Births               Home Medications    Prior to Admission medications   Medication Sig Start Date End Date Taking? Authorizing Provider  AMITIZA 24 MCG capsule Take 24 mcg by mouth daily. 11/15/16   [provider]  amitriptyline (ELAVIL) 50 MG tablet Take 50 mg by mouth at bedtime. 11/17/16  [provider]  Buprenorphine (BUTRANS) 7.5 MCG/HR PTWK Place 7.5 mcg onto the skin every 7 (seven) days.    [provider]  Cholecalciferol (VITAMIN D3) 50000 units CAPS TAKE 1 CAPSULE BY MOUTH WEEKLY 04/20/16   [provider]  DULoxetine (CYMBALTA) 60 MG capsule TAKE ONE CAPSULE BY MOUTH TWICE DAILY 10/08/17   Myrlene Broker, MD  esomeprazole (NEXIUM) 40 MG capsule Take 40 mg by mouth daily. 04/20/16   [provider]  hydrALAZINE (APRESOLINE) 25 MG tablet Take 25 mg by mouth 4 (four) times daily. 10/04/16   [provider]  hydroxychloroquine (PLAQUENIL) 200 MG tablet Take 200 mg by mouth 2 (two) times daily.    [provider]  hydrOXYzine (VISTARIL) 50 MG capsule Take 50 mg by  mouth every 8 (eight) hours as needed.    [provider]  Insulin Aspart (NOVOLOG FLEXPEN [place]) Inject 10-16 Units into the skin 3 (three) times daily with meals.    [provider]  Insulin Detemir (LEVEMIR FLEXTOUCH) 100 UNIT/ML Pen Inject 30 Units into the skin at bedtime. Patient taking differently: Inject 30 Units into the skin at bedtime as needed.  07/07/16   Roma Kayser, MD  LORazepam (ATIVAN) 1 MG tablet Take 1 tablet (1 mg total) by mouth 2 (two) times daily as needed for anxiety. 10/08/17   Myrlene Broker, MD  NOVOLOG FLEXPEN 100 UNIT/ML FlexPen INJECT 90 UNITS INTO THE SKIN DAILY OR FOLLOW SLIDING SCALE 11/21/16   [provider]  oxyCODONE-acetaminophen (PERCOCET) 10-325 MG tablet Take 1 tablet by mouth every 8 (eight) hours as needed for pain.    [provider]  potassium chloride (K-DUR) 10 MEQ tablet Take 10 mEq by mouth 2 (two) times daily. 09/18/16   [provider]  rOPINIRole (REQUIP) 0.25 MG tablet Take 0.25 mg by mouth at bedtime. 11/20/16   [provider]  Suvorexant (BELSOMRA) 10 MG TABS Take 10 mg by mouth at bedtime.    [provider]  tiZANidine (ZANAFLEX) 4 MG tablet Take 4 mg by mouth every 6 (six) hours as needed for muscle spasms.    [provider]  Topiramate ER (TROKENDI XR) 100 MG CP24 Take 100 mg by mouth at bedtime.    [provider]  warfarin (COUMADIN) 7.5 MG tablet Take 1 tablet daily except 1/2 tablet on Wednesdays 04/25/17   Laqueta Linden, MD  ziprasidone (GEODON) 20 MG capsule TAKE ONE CAPSULE BY MOUTH EVERY DAY AFTER SUPPER 10/09/17   Myrlene Broker, MD    Family History Family History  Problem Relation Age of Onset  . Stroke Mother   . Cancer Other   . Diabetes Other   . Pseudochol deficiency Neg Hx   . Malignant hyperthermia Neg Hx   . Hypotension Neg Hx   . Anesthesia problems Neg Hx     Social History Social History   Tobacco Use  . Smoking  status: Never Smoker  . Smokeless tobacco: Never Used  Substance Use Topics  . Alcohol use: No    Comment: 08-21-2016 per pt no  . Drug use: No    Comment: 08-21-2016 per pt no     Allergies   Codeine; Compazine; Ivp dye [iodinated diagnostic agents]; Lisinopril; Nitrofurantoin monohyd macro; Penicillins; and Sulfasalazine   Review of Systems Review of Systems  Constitutional: Positive for appetite change, chills and fever. Negative for activity change.       All ROS Neg except as noted  in HPI  HENT: Positive for congestion, postnasal drip, sinus pressure, sore throat and trouble swallowing. Negative for nosebleeds.   Eyes: Negative for photophobia and discharge.  Respiratory: Positive for cough. Negative for shortness of breath and wheezing.   Cardiovascular: Negative for chest pain and palpitations.  Gastrointestinal: Negative for abdominal pain and blood in stool.  Genitourinary: Negative for dysuria, frequency and hematuria.  Musculoskeletal: Positive for myalgias. Negative for arthralgias, back pain and neck pain.  Skin: Negative.   Neurological: Negative for dizziness, seizures and speech difficulty.  Psychiatric/Behavioral: Negative for confusion and hallucinations.     Physical Exam Updated Vital Signs BP (!) 116/54 (BP Location: Right Arm)   Pulse 90   Temp 99.7 F (37.6 C) (Oral)   Resp 15   Ht 5\' 7"  (1.702 m)   Wt 107 kg   SpO2 97%   BMI 36.96 kg/m   Physical Exam  Constitutional: She is oriented to person, place, and time. She appears well-developed and well-nourished.  Non-toxic appearance.  HENT:  Head: Normocephalic.  Right Ear: Tympanic membrane and external ear normal.  Left Ear: Tympanic membrane and external ear normal.  There is tenderness to percussion over the  Sinuses. There is mild to moderate increased redness of the posterior pharynx.  The uvula is in the midline.  There is mucus noted in the back of the throat.  There is nasal congestion  present.  Eyes: Pupils are equal, round, and reactive to light. EOM and lids are normal.  Neck: Normal range of motion. Neck supple. Carotid bruit is not present.  Cardiovascular: Normal rate, regular rhythm, normal heart sounds, intact distal pulses and normal pulses.  Pulmonary/Chest: No respiratory distress. She has wheezes. She has rhonchi.  Abdominal: Soft. Bowel sounds are normal. There is no tenderness. There is no guarding.  Musculoskeletal: Normal range of motion.  Lymphadenopathy:       Head (right side): No submandibular adenopathy present.       Head (left side): No submandibular adenopathy present.    She has no cervical adenopathy.  Neurological: She is alert and oriented to person, place, and time. She has normal strength. No cranial nerve deficit or sensory deficit.  Skin: Skin is warm and dry. No rash noted.  Psychiatric: She has a normal mood and affect. Her speech is normal.  Nursing note and vitals reviewed.    ED Treatments / Results  Labs (all labs ordered are listed, but only abnormal results are displayed) Labs Reviewed - No data to display  EKG None  Radiology No results found.  Procedures Procedures (including critical care time)  Medications Ordered in ED Medications - No data to display   Initial Impression / Assessment and Plan / ED Course  I have reviewed the triage vital signs and the nursing notes.  Pertinent labs & imaging results that were available during my care of the patient were reviewed by me and considered in my medical decision making (see chart for details).       Final Clinical Impressions(s) / ED Diagnoses MDM  Vital signs reviewed.  Pulse oximetry is 97% on room air.  Within normal limits by my interpretation.  Patient has cough in the midst of attempting to finish a sentence and with taking a deep breath.  Pt receiving albuterol nebulizer. Chest xray and strep and flu testing pending. Pt care to be continued  By Dr  Judd Lien.   Final diagnoses:  Acute bronchitis, unspecified organism    ED Discharge  Orders    None       Ivery Quale, PA-C 11/09/17 1617    Geoffery Lyons, MD 11/09/17 [phone]

## 2017-11-09 NOTE — Discharge Instructions (Signed)
Zithromax as prescribed.  Tessalon Perles as prescribed.  Follow-up with your primary doctor if symptoms are not improving in the next 3 to 4 days, and return to the ER if symptoms significantly worsen or change.

## 2017-11-09 NOTE — ED Triage Notes (Signed)
Fever, cough, congestion since Sunday, pressure in her face and nose.

## 2017-11-28 ENCOUNTER — Telehealth: Payer: Self-pay | Admitting: Rheumatology

## 2017-11-28 NOTE — Telephone Encounter (Signed)
Patient called stating she was hospitalized and in rehab for a brain aneurism.  Patient scheduled an appointment for 01/01/18 and is checking if she needs labs prior to that appointment.  Patient states her last appointment was in October of last year.

## 2017-11-28 NOTE — Telephone Encounter (Signed)
Patient advised that she may wait until she comes in for appointment to have her labs drawn as it has been a while since her last appointment and Dr. Corliss Skains may want to add some more than just her standing labs. Patient verbalized understanding.

## 2017-12-05 DIAGNOSIS — Z794 Long term (current) use of insulin: Secondary | ICD-10-CM | POA: Diagnosis not present

## 2017-12-05 DIAGNOSIS — E119 Type 2 diabetes mellitus without complications: Secondary | ICD-10-CM | POA: Diagnosis not present

## 2017-12-05 DIAGNOSIS — Z23 Encounter for immunization: Secondary | ICD-10-CM | POA: Diagnosis not present

## 2017-12-11 DIAGNOSIS — G4700 Insomnia, unspecified: Secondary | ICD-10-CM | POA: Diagnosis not present

## 2017-12-11 DIAGNOSIS — M329 Systemic lupus erythematosus, unspecified: Secondary | ICD-10-CM | POA: Diagnosis not present

## 2017-12-11 DIAGNOSIS — Z79891 Long term (current) use of opiate analgesic: Secondary | ICD-10-CM | POA: Diagnosis not present

## 2017-12-11 DIAGNOSIS — M791 Myalgia, unspecified site: Secondary | ICD-10-CM | POA: Diagnosis not present

## 2017-12-18 NOTE — Progress Notes (Deleted)
Office Visit Note  Patient: Haley Goodman             Date of Birth: [birth year]           MRN: [id number]             PCP: Avon Gully, MD Referring: Avon Gully, MD Visit Date: 01/01/2018 Occupation: @GUAROCC @  Subjective:  No chief complaint on file.  Last seen in office on 11/30/16.  She was on Plaquenil 200 mg BID M-F. No documentation of last PLQ eye exam. Last CBC/CMP within normal limits on 11/30/16.  CBC/CMP ordered placed for today to monitor for  drug toxicity.  History of Present Illness: Haley Goodman is a 37 y.o. female ***   Activities of Daily Living:  Patient reports morning stiffness for *** {minute/hour:19697}.   Patient {ACTIONS;DENIES/REPORTS:21021675::"Denies"} nocturnal pain.  Difficulty dressing/grooming: {ACTIONS;DENIES/REPORTS:21021675::"Denies"} Difficulty climbing stairs: {ACTIONS;DENIES/REPORTS:21021675::"Denies"} Difficulty getting out of chair: {ACTIONS;DENIES/REPORTS:21021675::"Denies"} Difficulty using hands for taps, buttons, cutlery, and/or writing: {ACTIONS;DENIES/REPORTS:21021675::"Denies"}  No Rheumatology ROS completed.   PMFS History:  Patient Active Problem List   Diagnosis Date Noted  . MDD (major depressive disorder), recurrent episode, moderate (HCC) 03/22/2017  . Encounter for therapeutic drug monitoring 01/11/2017  . Rheumatoid arthritis involving multiple sites with positive rheumatoid factor (HCC) 05/10/2016  . Fibromyalgia 05/10/2016  . Primary osteoarthritis of both hands 05/10/2016  . Chronic pain syndrome/ in pain manaement 05/10/2016  . High risk medication use/ Plaquenil 05/10/2016  . Spondylosis of lumbar region without myelopathy or radiculopathy 05/10/2016  . Abnormal serum protein electrophoresis 05/10/2016  . Vitamin D deficiency 05/10/2016  . Uncontrolled type 2 diabetes mellitus with complication, without long-term current use of insulin (HCC) 03/13/2016  . Essential hypertension, benign  03/13/2016  . Morbid obesity (HCC) 03/13/2016  . Pulmonary embolism (HCC) 06/06/2011    Class: History of    Past Medical History:  Diagnosis Date  . Anemia   . Arthritis   . Fibromyalgia   . Hypertension   . Lupus (HCC)   . PE (pulmonary embolism) 2009  . RA (rheumatoid arthritis) (HCC)     Family History  Problem Relation Age of Onset  . Stroke Mother   . Cancer Other   . Diabetes Other   . Pseudochol deficiency Neg Hx   . Malignant hyperthermia Neg Hx   . Hypotension Neg Hx   . Anesthesia problems Neg Hx    Past Surgical History:  Procedure Laterality Date  . ABLATION    . CHOLECYSTECTOMY    . DILATION AND CURETTAGE OF UTERUS    . TUBAL LIGATION     Social History   Social History Narrative  . Not on file    Objective: Vital Signs: There were no vitals taken for this visit.   Physical Exam   Musculoskeletal Exam: ***  CDAI Exam: CDAI Score: Not documented Patient Global Assessment: Not documented; Provider Global Assessment: Not documented Swollen: Not documented; Tender: Not documented Joint Exam   Not documented   There is currently no information documented on the homunculus. Go to the Rheumatology activity and complete the homunculus joint exam.  Investigation: No additional findings.  Imaging: No results found.  Recent Labs: Lab Results  Component Value Date   WBC 8.8 11/30/2016   HGB 12.0 11/30/2016   PLT 247 11/30/2016   NA 134 (L) 11/30/2016   K 4.6 11/30/2016   CL 99 11/30/2016   CO2 29 11/30/2016   GLUCOSE 147 (H) 11/30/2016   BUN 8 11/30/2016  CREATININE 0.84 11/30/2016   BILITOT 0.3 11/30/2016   ALKPHOS 96 03/12/2009   AST 16 11/30/2016   ALT 18 11/30/2016   PROT 7.5 11/30/2016   ALBUMIN 4.0 03/12/2009   CALCIUM 8.8 11/30/2016   GFRAA 102 11/30/2016    Speciality Comments: No specialty comments available.  Procedures:  No procedures performed Allergies: Codeine; Compazine; Ivp dye [iodinated diagnostic agents];  Lisinopril; Nitrofurantoin monohyd macro; Penicillins; and Sulfasalazine   Assessment / Plan:     Visit Diagnoses: Rheumatoid arthritis involving multiple sites with positive rheumatoid factor (HCC)  High risk medication use - PLQ 200 mg BID M-F  Fibromyalgia  Trapezius muscle spasm  Chronic SI joint pain  Primary osteoarthritis of both hands  Abnormal serum protein electrophoresis  Spondylosis of lumbar region without myelopathy or radiculopathy  Chronic pain syndrome/ in pain manaement  Vitamin D deficiency   Orders: No orders of the defined types were placed in this encounter.  No orders of the defined types were placed in this encounter.   Face-to-face time spent with patient was *** minutes. Greater than 50% of time was spent in counseling and coordination of care.  Follow-Up Instructions: No follow-ups on file.   Gearldine Bienenstock, PA-C  Note - This record has been created using Dragon software.  Chart creation errors have been sought, but may not always  have been located. Such creation errors do not reflect on  the standard of medical care.

## 2018-01-01 ENCOUNTER — Ambulatory Visit: Payer: Medicare Other | Admitting: Physician Assistant

## 2018-01-11 DIAGNOSIS — B349 Viral infection, unspecified: Secondary | ICD-10-CM | POA: Diagnosis not present

## 2018-01-11 DIAGNOSIS — Z7982 Long term (current) use of aspirin: Secondary | ICD-10-CM | POA: Diagnosis not present

## 2018-01-11 DIAGNOSIS — M069 Rheumatoid arthritis, unspecified: Secondary | ICD-10-CM | POA: Diagnosis not present

## 2018-01-11 DIAGNOSIS — Z833 Family history of diabetes mellitus: Secondary | ICD-10-CM | POA: Diagnosis not present

## 2018-01-11 DIAGNOSIS — I10 Essential (primary) hypertension: Secondary | ICD-10-CM | POA: Diagnosis not present

## 2018-01-11 DIAGNOSIS — R05 Cough: Secondary | ICD-10-CM | POA: Diagnosis not present

## 2018-01-11 DIAGNOSIS — Z888 Allergy status to other drugs, medicaments and biological substances status: Secondary | ICD-10-CM | POA: Diagnosis not present

## 2018-01-11 DIAGNOSIS — Z79899 Other long term (current) drug therapy: Secondary | ICD-10-CM | POA: Diagnosis not present

## 2018-01-11 DIAGNOSIS — Z880 Allergy status to penicillin: Secondary | ICD-10-CM | POA: Diagnosis not present

## 2018-01-11 DIAGNOSIS — M797 Fibromyalgia: Secondary | ICD-10-CM | POA: Diagnosis not present

## 2018-01-11 DIAGNOSIS — Z8249 Family history of ischemic heart disease and other diseases of the circulatory system: Secondary | ICD-10-CM | POA: Diagnosis not present

## 2018-01-14 ENCOUNTER — Ambulatory Visit (INDEPENDENT_AMBULATORY_CARE_PROVIDER_SITE_OTHER): Payer: Medicare Other | Admitting: Rheumatology

## 2018-01-14 ENCOUNTER — Encounter: Payer: Self-pay | Admitting: Rheumatology

## 2018-01-14 VITALS — BP 123/80 | HR 72 | Resp 17 | Ht 67.0 in | Wt 229.6 lb

## 2018-01-14 DIAGNOSIS — M797 Fibromyalgia: Secondary | ICD-10-CM | POA: Diagnosis not present

## 2018-01-14 DIAGNOSIS — M19042 Primary osteoarthritis, left hand: Secondary | ICD-10-CM

## 2018-01-14 DIAGNOSIS — Z8659 Personal history of other mental and behavioral disorders: Secondary | ICD-10-CM | POA: Diagnosis not present

## 2018-01-14 DIAGNOSIS — Z8679 Personal history of other diseases of the circulatory system: Secondary | ICD-10-CM | POA: Diagnosis not present

## 2018-01-14 DIAGNOSIS — R7611 Nonspecific reaction to tuberculin skin test without active tuberculosis: Secondary | ICD-10-CM

## 2018-01-14 DIAGNOSIS — M0579 Rheumatoid arthritis with rheumatoid factor of multiple sites without organ or systems involvement: Secondary | ICD-10-CM

## 2018-01-14 DIAGNOSIS — M19041 Primary osteoarthritis, right hand: Secondary | ICD-10-CM

## 2018-01-14 DIAGNOSIS — Z79899 Other long term (current) drug therapy: Secondary | ICD-10-CM | POA: Diagnosis not present

## 2018-01-14 DIAGNOSIS — Z86711 Personal history of pulmonary embolism: Secondary | ICD-10-CM

## 2018-01-14 DIAGNOSIS — Z8639 Personal history of other endocrine, nutritional and metabolic disease: Secondary | ICD-10-CM

## 2018-01-14 DIAGNOSIS — F411 Generalized anxiety disorder: Secondary | ICD-10-CM | POA: Diagnosis not present

## 2018-01-14 MED ORDER — HYDROXYCHLOROQUINE SULFATE 200 MG PO TABS: 200.0000 mg | ORAL_TABLET | Freq: Two times a day (BID) | ORAL | 0 refills | Status: DC

## 2018-01-14 NOTE — Progress Notes (Signed)
Office Visit Note  Patient: Haley Goodman             Date of Birth: Oct 10, 1980           MRN: [id number]             PCP: Kela Millin, MD Referring: Avon Gully, MD Visit Date: 01/14/2018 Occupation: @GUAROCC @  Subjective:  Lower back pain   History of Present Illness: Haley Goodman is a 37 y.o. female with history of seropositive rheumatoid arthritis, fibromyalgia, and osteoarthritis.  Patient is taking Plaquenil 200 mg 1 tablet by mouth twice daily.  She denies any recent flares of rheumatoid arthritis.  She continues to have chronic pain in bilateral knee joints but denies any joint swelling.  She states that she continues to have chronic lower back pain and follows up at pain management on a regular basis.  She has had injections in the past.  She continues to have generalized muscle aches muscle tenderness due to fibromyalgia.  Activities of Daily Living:  Patient reports morning stiffness all day.   Patient Reports nocturnal pain.  Difficulty dressing/grooming: Denies Difficulty climbing stairs: Denies Difficulty getting out of chair: Denies Difficulty using hands for taps, buttons, cutlery, and/or writing: Reports  Review of Systems  Constitutional: Positive for fatigue.  HENT: Positive for mouth dryness. Negative for mouth sores, trouble swallowing, trouble swallowing and nose dryness.   Eyes: Positive for dryness. Negative for pain, redness, itching and visual disturbance.  Respiratory: Negative for cough, hemoptysis, shortness of breath, wheezing and difficulty breathing.   Cardiovascular: Negative for chest pain, palpitations, hypertension and swelling in legs/feet.  Gastrointestinal: Negative for blood in stool, constipation, diarrhea, nausea and vomiting.  Endocrine: Negative for increased urination.  Genitourinary: Negative for painful urination, nocturia and pelvic pain.  Musculoskeletal: Positive for arthralgias, joint pain and morning  stiffness. Negative for joint swelling, myalgias, muscle weakness, muscle tenderness and myalgias.  Skin: Negative for color change, pallor, rash, hair loss, nodules/bumps, skin tightness, ulcers and sensitivity to sunlight.  Allergic/Immunologic: Negative for susceptible to infections.  Neurological: Positive for headaches. Negative for dizziness, light-headedness, numbness and memory loss.  Hematological: Negative for bruising/bleeding tendency and swollen glands.  Psychiatric/Behavioral: Positive for sleep disturbance. Negative for depressed mood and confusion. The patient is not nervous/anxious.     PMFS History:  Patient Active Problem List   Diagnosis Date Noted  . MDD (major depressive disorder), recurrent episode, moderate (HCC) 03/22/2017  . Encounter for therapeutic drug monitoring 01/11/2017  . Rheumatoid arthritis involving multiple sites with positive rheumatoid factor (HCC) 05/10/2016  . Fibromyalgia 05/10/2016  . Primary osteoarthritis of both hands 05/10/2016  . Chronic pain syndrome/ in pain manaement 05/10/2016  . High risk medication use/ Plaquenil 05/10/2016  . Spondylosis of lumbar region without myelopathy or radiculopathy 05/10/2016  . Abnormal serum protein electrophoresis 05/10/2016  . Vitamin D deficiency 05/10/2016  . Uncontrolled type 2 diabetes mellitus with complication, without long-term current use of insulin (HCC) 03/13/2016  . Essential hypertension, benign 03/13/2016  . Morbid obesity (HCC) 03/13/2016  . Pulmonary embolism (HCC) 06/06/2011    Class: History of    Past Medical History:  Diagnosis Date  . Anemia   . Arthritis   . Fibromyalgia   . Hypertension   . Lupus (HCC)   . PE (pulmonary embolism) 2009  . RA (rheumatoid arthritis) (HCC)     Family History  Problem Relation Age of Onset  . Stroke Mother   . Cancer Other   .  Diabetes Other   . Pseudochol deficiency Neg Hx   . Malignant hyperthermia Neg Hx   . Hypotension Neg Hx   .  Anesthesia problems Neg Hx    Past Surgical History:  Procedure Laterality Date  . ABLATION    . CHOLECYSTECTOMY    . DILATION AND CURETTAGE OF UTERUS    . TUBAL LIGATION     Social History   Social History Narrative  . Not on file    Objective: Vital Signs: BP 123/80 (BP Location: Right Arm, Patient Position: Sitting, Cuff Size: Normal)   Pulse 72   Resp 17   Ht 5\' 7"  (1.702 m)   Wt 229 lb 9.6 oz (104.1 kg)   BMI 35.96 kg/m    Physical Exam  Constitutional: She is oriented to person, place, and time. She appears well-developed and well-nourished.  HENT:  Head: Normocephalic and atraumatic.  Eyes: Conjunctivae and EOM are normal.  Neck: Normal range of motion.  Cardiovascular: Normal rate, regular rhythm, normal heart sounds and intact distal pulses.  Pulmonary/Chest: Effort normal and breath sounds normal.  Abdominal: Soft. Bowel sounds are normal.  Lymphadenopathy:    She has no cervical adenopathy.  Neurological: She is alert and oriented to person, place, and time.  Skin: Skin is warm and dry. Capillary refill takes less than 2 seconds.  Psychiatric: She has a normal mood and affect. Her behavior is normal.  Nursing note and vitals reviewed.    Musculoskeletal Exam: C-spinne, thoracic spine, and lumbar spine good ROM.  Midline spinal tenderness in the lumbar region.  No SI joint tenderness.  Shoulder joints, elbow joints, wrist joints, MCPs, PIPs, and DIPs good ROM with no synovitis.  Hip joints, knee joints, ankle joints, MTPs, PIPs, and DIPs good ROM with no synovitis.  No warmth or effusion of knee joints.  No tenderness or swelling of ankle joints.    CDAI Exam: CDAI Score: Not documented Patient Global Assessment: Not documented; Provider Global Assessment: Not documented Swollen: Not documented; Tender: Not documented Joint Exam   Not documented   There is currently no information documented on the homunculus. Go to the Rheumatology activity and complete  the homunculus joint exam.  Investigation: No additional findings.  Imaging: No results found.  Recent Labs: Lab Results  Component Value Date   WBC 8.8 11/30/2016   HGB 12.0 11/30/2016   PLT 247 11/30/2016   NA 134 (L) 11/30/2016   K 4.6 11/30/2016   CL 99 11/30/2016   CO2 29 11/30/2016   GLUCOSE 147 (H) 11/30/2016   BUN 8 11/30/2016   CREATININE 0.84 11/30/2016   BILITOT 0.3 11/30/2016   ALKPHOS 96 03/12/2009   AST 16 11/30/2016   ALT 18 11/30/2016   PROT 7.5 11/30/2016   ALBUMIN 4.0 03/12/2009   CALCIUM 8.8 11/30/2016   GFRAA 102 11/30/2016    Speciality Comments: No specialty comments available.  Procedures:  No procedures performed Allergies: Codeine; Compazine; Ivp dye [iodinated diagnostic agents]; Lisinopril; Nitrofurantoin monohyd macro; Penicillins; and Sulfasalazine   Assessment / Plan:     Visit Diagnoses: Rheumatoid arthritis involving multiple sites with positive rheumatoid factor (HCC) - +CCP: She has no active synovitis.  She has not had any recent rheumatoid arthritis flares.  She is clinically doing well on Plaquenil 200 mg 1 tablet by mouth twice daily.  A refill of Plaquenil sent to the pharmacy today.  She was advised to notify 01/30/2017 if she develops increased joint pain or joint swelling.  She  will follow-up in the office in 5 months.  High risk medication use - PLQ 200 mg 1 tablet BID.  CBC and CMP were drawn today to monitor for drug toxicity.- Plan: COMPLETE METABOLIC PANEL WITH GFR, CBC with Differential/Platelet  Fibromyalgia: She has generalized hyperalgesia on exam.  She continues to have generalized muscle aches muscle tenderness due to fibromyalgia.  She is chronic fatigue and insomnia.  She was in and out of sleep during the office visit today.  She continues to take Cymbalta 60 mg 1 capsule by mouth daily.  She requested a prescription for Flexeril but is already prescribed Zanaflex.  She was advised to discuss switching to Flexeril with her  PCP.  Primary osteoarthritis of both hands: She hs no synovitis or discomfort at this time.  She has complete fist formation bilaterally.  Joint protection and muscle strengthening were discussed.   Other medical conditions are listed as follows:   Positive PPD  History of depression  History of vitamin D deficiency  History of hypertension  History of pulmonary embolism  History of psychosis   Orders: Orders Placed This Encounter  Procedures  . COMPLETE METABOLIC PANEL WITH GFR  . CBC with Differential/Platelet   Meds ordered this encounter  Medications  . hydroxychloroquine (PLAQUENIL) 200 MG tablet    Sig: Take 1 tablet (200 mg total) by mouth 2 (two) times daily.    Dispense:  180 tablet    Refill:  0    Face-to-face time spent with patient was 30 minutes. Greater than 50% of time was spent in counseling and coordination of care.  Follow-Up Instructions: Return in about 5 months (around 06/15/2018) for Rheumatoid arthritis.   Gearldine Bienenstock, PA-C   I examined and evaluated the patient with Haley Ales PA.  On my examination patient did not have any synovitis.  She is clinically doing well on Plaquenil.  She continues to have pain and discomfort from fibromyalgia.  The plan of care was discussed as noted above.  Haley Savoy, MD  Note - This record has been created using Animal nutritionist.  Chart creation errors have been sought, but may not always  have been located. Such creation errors do not reflect on  the standard of medical care.

## 2018-01-15 LAB — COMPLETE METABOLIC PANEL WITH GFR
AG Ratio: 1.3 (calc) (ref 1.0–2.5)
ALBUMIN MSPROF: 3.9 g/dL (ref 3.6–5.1)
ALT: 17 U/L (ref 6–29)
AST: 19 U/L (ref 10–30)
Alkaline phosphatase (APISO): 67 U/L (ref 33–115)
BILIRUBIN TOTAL: 0.2 mg/dL (ref 0.2–1.2)
BUN: 9 mg/dL (ref 7–25)
CHLORIDE: 106 mmol/L (ref 98–110)
CO2: 26 mmol/L (ref 20–32)
Calcium: 8.8 mg/dL (ref 8.6–10.2)
Creat: 0.92 mg/dL (ref 0.50–1.10)
GFR, EST AFRICAN AMERICAN: 91 mL/min/{1.73_m2} (ref >=60)
GFR, Est Non African American: 78 mL/min/{1.73_m2} (ref >=60)
Globulin: 2.9 g/dL (calc) (ref 1.9–3.7)
Glucose, Bld: 98 mg/dL (ref 65–99)
POTASSIUM: 4 mmol/L (ref 3.5–5.3)
Sodium: 139 mmol/L (ref 135–146)
TOTAL PROTEIN: 6.8 g/dL (ref 6.1–8.1)

## 2018-01-15 LAB — CBC WITH DIFFERENTIAL/PLATELET
Basophils Absolute: 51 cells/uL (ref 0–200)
Basophils Relative: 0.7 %
Eosinophils Absolute: 642 cells/uL — ABNORMAL HIGH (ref 15–500)
Eosinophils Relative: 8.8 %
HCT: 36.3 % (ref 35.0–45.0)
Hemoglobin: 11.8 g/dL (ref 11.7–15.5)
Lymphs Abs: 3869 cells/uL (ref 850–3900)
MCH: 26.5 pg — AB (ref 27.0–33.0)
MCHC: 32.5 g/dL (ref 32.0–36.0)
MCV: 81.6 fL (ref 80.0–100.0)
MONOS PCT: 6.8 %
MPV: 11 fL (ref 7.5–12.5)
NEUTROS PCT: 30.7 %
Neutro Abs: 2241 cells/uL (ref 1500–7800)
PLATELETS: 253 10*3/uL (ref 140–400)
RBC: 4.45 10*6/uL (ref 3.80–5.10)
RDW: 13.9 % (ref 11.0–15.0)
TOTAL LYMPHOCYTE: 53 %
WBC: 7.3 10*3/uL (ref 3.8–10.8)
WBCMIX: 496 {cells}/uL (ref 200–950)

## 2018-01-15 NOTE — Progress Notes (Signed)
CMP WNL. CBC stable.

## 2018-02-05 DIAGNOSIS — Z79891 Long term (current) use of opiate analgesic: Secondary | ICD-10-CM | POA: Diagnosis not present

## 2018-02-05 DIAGNOSIS — G4700 Insomnia, unspecified: Secondary | ICD-10-CM | POA: Diagnosis not present

## 2018-02-05 DIAGNOSIS — M791 Myalgia, unspecified site: Secondary | ICD-10-CM | POA: Diagnosis not present

## 2018-02-05 DIAGNOSIS — M329 Systemic lupus erythematosus, unspecified: Secondary | ICD-10-CM | POA: Diagnosis not present

## 2018-02-13 ENCOUNTER — Telehealth: Payer: Self-pay | Admitting: Rheumatology

## 2018-02-13 NOTE — Telephone Encounter (Signed)
Patient called stating Dr. Corliss Skains prescribed Tizanidine and she states it is making her gain weight.  Patient states that she has been trying to lose weight so she will qualify for a breast reduction to help with her back pain.  Patient is requesting a prescription of Flexeril to be sent to [facility] on [address].

## 2018-02-14 DIAGNOSIS — R0789 Other chest pain: Secondary | ICD-10-CM | POA: Diagnosis not present

## 2018-02-14 DIAGNOSIS — Z882 Allergy status to sulfonamides status: Secondary | ICD-10-CM | POA: Diagnosis not present

## 2018-02-14 DIAGNOSIS — R0602 Shortness of breath: Secondary | ICD-10-CM | POA: Diagnosis not present

## 2018-02-14 DIAGNOSIS — M797 Fibromyalgia: Secondary | ICD-10-CM | POA: Diagnosis not present

## 2018-02-14 DIAGNOSIS — Z91041 Radiographic dye allergy status: Secondary | ICD-10-CM | POA: Diagnosis not present

## 2018-02-14 DIAGNOSIS — Z79899 Other long term (current) drug therapy: Secondary | ICD-10-CM | POA: Diagnosis not present

## 2018-02-14 DIAGNOSIS — R079 Chest pain, unspecified: Secondary | ICD-10-CM | POA: Diagnosis not present

## 2018-02-14 DIAGNOSIS — Z7982 Long term (current) use of aspirin: Secondary | ICD-10-CM | POA: Diagnosis not present

## 2018-02-14 DIAGNOSIS — Z880 Allergy status to penicillin: Secondary | ICD-10-CM | POA: Diagnosis not present

## 2018-02-14 DIAGNOSIS — M069 Rheumatoid arthritis, unspecified: Secondary | ICD-10-CM | POA: Diagnosis not present

## 2018-02-14 DIAGNOSIS — Z888 Allergy status to other drugs, medicaments and biological substances status: Secondary | ICD-10-CM | POA: Diagnosis not present

## 2018-02-14 DIAGNOSIS — Z8249 Family history of ischemic heart disease and other diseases of the circulatory system: Secondary | ICD-10-CM | POA: Diagnosis not present

## 2018-02-14 DIAGNOSIS — I10 Essential (primary) hypertension: Secondary | ICD-10-CM | POA: Diagnosis not present

## 2018-02-14 NOTE — Telephone Encounter (Signed)
Attempted to contact the patient and left message for patient to call the office.  

## 2018-02-15 ENCOUNTER — Other Ambulatory Visit (HOSPITAL_COMMUNITY): Payer: Self-pay | Admitting: Psychiatry

## 2018-02-15 ENCOUNTER — Telehealth: Payer: Self-pay | Admitting: Rheumatology

## 2018-02-15 NOTE — Telephone Encounter (Signed)
Patient states she would like to change her medication. Patient states she went to the emergency room for a flare on Rheumatoid Arthritis and Fibromyalgia. D-Dimer test was abnormal. Patient states she was given Solu-Medrol and Toradol Injection. Patient was discharged with a prednisone prescription. Patient states she is on Zanaflex and believes this has caused her weight gain. Patient states she would like to change her prescription to Flexeril which she was on in the past.  Patient is trying to lose weight to qualify for a breast reduction. Patient needs the breast reduction due to her back. Please.

## 2018-02-15 NOTE — Telephone Encounter (Signed)
Patient left a voicemail stating she was returning Andrea's call.   °

## 2018-02-18 DIAGNOSIS — L930 Discoid lupus erythematosus: Secondary | ICD-10-CM | POA: Diagnosis not present

## 2018-02-18 DIAGNOSIS — R7989 Other specified abnormal findings of blood chemistry: Secondary | ICD-10-CM | POA: Diagnosis not present

## 2018-02-18 DIAGNOSIS — Z862 Personal history of diseases of the blood and blood-forming organs and certain disorders involving the immune mechanism: Secondary | ICD-10-CM | POA: Diagnosis not present

## 2018-02-18 DIAGNOSIS — Z794 Long term (current) use of insulin: Secondary | ICD-10-CM | POA: Diagnosis not present

## 2018-02-18 DIAGNOSIS — E119 Type 2 diabetes mellitus without complications: Secondary | ICD-10-CM | POA: Diagnosis not present

## 2018-02-18 NOTE — Telephone Encounter (Signed)
Patient advised Weight gain is not a side effect of Zanaflex.  Patient advised per Dr. Corliss Skains would advise her to stay on Zanaflex and not Flexeril as Flexeril interferes with several of her medications. Patient verbalized understanding.

## 2018-02-18 NOTE — Telephone Encounter (Signed)
Weight gain is not a side effect of Zanaflex.  I would advise her to stay on Zanaflex and not Flexeril as Flexeril interferes with several of her medications.

## 2018-02-24 DIAGNOSIS — M25511 Pain in right shoulder: Secondary | ICD-10-CM | POA: Diagnosis not present

## 2018-02-24 DIAGNOSIS — S40011A Contusion of right shoulder, initial encounter: Secondary | ICD-10-CM | POA: Diagnosis not present

## 2018-02-24 DIAGNOSIS — W108XXA Fall (on) (from) other stairs and steps, initial encounter: Secondary | ICD-10-CM | POA: Diagnosis not present

## 2018-02-24 DIAGNOSIS — I10 Essential (primary) hypertension: Secondary | ICD-10-CM | POA: Diagnosis not present

## 2018-02-24 DIAGNOSIS — Z7982 Long term (current) use of aspirin: Secondary | ICD-10-CM | POA: Diagnosis not present

## 2018-02-24 DIAGNOSIS — M797 Fibromyalgia: Secondary | ICD-10-CM | POA: Diagnosis not present

## 2018-02-24 DIAGNOSIS — Z79899 Other long term (current) drug therapy: Secondary | ICD-10-CM | POA: Diagnosis not present

## 2018-03-13 DIAGNOSIS — Z794 Long term (current) use of insulin: Secondary | ICD-10-CM | POA: Diagnosis not present

## 2018-03-13 DIAGNOSIS — M546 Pain in thoracic spine: Secondary | ICD-10-CM | POA: Diagnosis not present

## 2018-03-13 DIAGNOSIS — N6481 Ptosis of breast: Secondary | ICD-10-CM | POA: Diagnosis not present

## 2018-03-13 DIAGNOSIS — G8929 Other chronic pain: Secondary | ICD-10-CM | POA: Diagnosis not present

## 2018-03-13 DIAGNOSIS — E119 Type 2 diabetes mellitus without complications: Secondary | ICD-10-CM | POA: Diagnosis not present

## 2018-03-26 DIAGNOSIS — R0789 Other chest pain: Secondary | ICD-10-CM | POA: Diagnosis not present

## 2018-03-26 DIAGNOSIS — I10 Essential (primary) hypertension: Secondary | ICD-10-CM | POA: Diagnosis not present

## 2018-03-26 DIAGNOSIS — Z79891 Long term (current) use of opiate analgesic: Secondary | ICD-10-CM | POA: Diagnosis not present

## 2018-03-26 DIAGNOSIS — Z7982 Long term (current) use of aspirin: Secondary | ICD-10-CM | POA: Diagnosis not present

## 2018-03-26 DIAGNOSIS — R51 Headache: Secondary | ICD-10-CM | POA: Diagnosis not present

## 2018-03-26 DIAGNOSIS — G44209 Tension-type headache, unspecified, not intractable: Secondary | ICD-10-CM | POA: Diagnosis not present

## 2018-03-26 DIAGNOSIS — F411 Generalized anxiety disorder: Secondary | ICD-10-CM | POA: Diagnosis not present

## 2018-03-26 DIAGNOSIS — Z79899 Other long term (current) drug therapy: Secondary | ICD-10-CM | POA: Diagnosis not present

## 2018-03-28 DIAGNOSIS — I6200 Nontraumatic subdural hemorrhage, unspecified: Secondary | ICD-10-CM | POA: Diagnosis not present

## 2018-03-28 DIAGNOSIS — S065X9A Traumatic subdural hemorrhage with loss of consciousness of unspecified duration, initial encounter: Secondary | ICD-10-CM | POA: Diagnosis not present

## 2018-03-28 DIAGNOSIS — S065X9D Traumatic subdural hemorrhage with loss of consciousness of unspecified duration, subsequent encounter: Secondary | ICD-10-CM | POA: Diagnosis not present

## 2018-04-01 DIAGNOSIS — M545 Low back pain: Secondary | ICD-10-CM | POA: Diagnosis not present

## 2018-04-01 DIAGNOSIS — G43719 Chronic migraine without aura, intractable, without status migrainosus: Secondary | ICD-10-CM | POA: Diagnosis not present

## 2018-04-01 DIAGNOSIS — Z79891 Long term (current) use of opiate analgesic: Secondary | ICD-10-CM | POA: Diagnosis not present

## 2018-04-01 DIAGNOSIS — M791 Myalgia, unspecified site: Secondary | ICD-10-CM | POA: Diagnosis not present

## 2018-04-01 DIAGNOSIS — J069 Acute upper respiratory infection, unspecified: Secondary | ICD-10-CM | POA: Diagnosis not present

## 2018-04-01 DIAGNOSIS — M329 Systemic lupus erythematosus, unspecified: Secondary | ICD-10-CM | POA: Diagnosis not present

## 2018-04-13 ENCOUNTER — Other Ambulatory Visit: Payer: Self-pay | Admitting: Physician Assistant

## 2018-04-15 ENCOUNTER — Other Ambulatory Visit: Payer: Self-pay | Admitting: Physician Assistant

## 2018-04-15 NOTE — Telephone Encounter (Signed)
Last Visit: 01/14/18 Next Visit: 06/17/18 Labs: 01/14/18 CMP WNL. CBC stable.  Eye exam: 02/2016 WNL  Left message to advise patient we need update PLQ eye exam  Okay to refill 30 day supply per Dr. Corliss Skains

## 2018-04-18 ENCOUNTER — Telehealth: Payer: Self-pay | Admitting: Rheumatology

## 2018-04-18 NOTE — Telephone Encounter (Signed)
Patient left a voicemail stating she had an eye exam with her PCP and the results were sent to an opthamologist.  Patient states she also had a diabetic eye check up and is requesting a return call to let her know if Dr. Corliss Skains needs a copy of the results faxed to her office.

## 2018-04-18 NOTE — Telephone Encounter (Signed)
Advised patient that we need a plaquenil eye exam and patient states she will call to get that scheduled and then have the results faxed to our office.

## 2018-05-06 DIAGNOSIS — N912 Amenorrhea, unspecified: Secondary | ICD-10-CM | POA: Diagnosis not present

## 2018-05-06 DIAGNOSIS — R300 Dysuria: Secondary | ICD-10-CM | POA: Diagnosis not present

## 2018-05-06 DIAGNOSIS — R509 Fever, unspecified: Secondary | ICD-10-CM | POA: Diagnosis not present

## 2018-05-06 DIAGNOSIS — J029 Acute pharyngitis, unspecified: Secondary | ICD-10-CM | POA: Diagnosis not present

## 2018-05-13 DIAGNOSIS — G4700 Insomnia, unspecified: Secondary | ICD-10-CM | POA: Diagnosis not present

## 2018-05-13 DIAGNOSIS — M545 Low back pain: Secondary | ICD-10-CM | POA: Diagnosis not present

## 2018-05-13 DIAGNOSIS — Z79891 Long term (current) use of opiate analgesic: Secondary | ICD-10-CM | POA: Diagnosis not present

## 2018-05-13 DIAGNOSIS — G43719 Chronic migraine without aura, intractable, without status migrainosus: Secondary | ICD-10-CM | POA: Diagnosis not present

## 2018-06-03 ENCOUNTER — Telehealth: Payer: Self-pay | Admitting: Rheumatology

## 2018-06-03 DIAGNOSIS — M0579 Rheumatoid arthritis with rheumatoid factor of multiple sites without organ or systems involvement: Secondary | ICD-10-CM

## 2018-06-03 NOTE — Telephone Encounter (Signed)
Patient left a voicemail stating she was told she needs an eye exam before she can receive a refill of Plaquenil.  Patient states "she has been trying for months to get an eye exam, but no one is scheduling appointments right now."  Patient is requesting a return call.

## 2018-06-04 MED ORDER — HYDROXYCHLOROQUINE SULFATE 200 MG PO TABS: 200.0000 mg | ORAL_TABLET | Freq: Two times a day (BID) | ORAL | 0 refills | Status: DC

## 2018-06-04 NOTE — Telephone Encounter (Signed)
Last Visit: 01/14/18 Next Visit: 06/17/18 Labs: 01/14/18 CMP WNL. CBC stable.  Eye exam: 02/2016 WNL  Patient advised to have her PLQ eye exam as soon as she is able to schedule it. Patient advised we understand with the COVID 19 that she is having a difficult time getting her appointment scheduled.

## 2018-06-17 ENCOUNTER — Ambulatory Visit: Payer: Medicare Other | Admitting: Rheumatology

## 2018-08-22 ENCOUNTER — Telehealth: Payer: Self-pay | Admitting: Rheumatology

## 2018-08-22 NOTE — Telephone Encounter (Signed)
Spoke with patient and provided patient with number to Dr. Katy Fitch [phone] and Dr Gershon Crane [phone]. Patient will call today to make an appointment.

## 2018-08-22 NOTE — Telephone Encounter (Signed)
Patient called stating she has not been able to schedule an eye exam.  Patient requested a return call to let her know if there is an eye doctor in the [place] area who is seeing patients.

## 2018-09-04 DIAGNOSIS — N62 Hypertrophy of breast: Secondary | ICD-10-CM | POA: Insufficient documentation

## 2018-09-04 DIAGNOSIS — M51369 Other intervertebral disc degeneration, lumbar region without mention of lumbar back pain or lower extremity pain: Secondary | ICD-10-CM | POA: Insufficient documentation

## 2018-09-04 DIAGNOSIS — H04123 Dry eye syndrome of bilateral lacrimal glands: Secondary | ICD-10-CM | POA: Insufficient documentation

## 2018-09-04 DIAGNOSIS — M5136 Other intervertebral disc degeneration, lumbar region: Secondary | ICD-10-CM | POA: Insufficient documentation

## 2018-09-18 NOTE — Progress Notes (Deleted)
Office Visit Note  Patient: Haley Goodman             Date of Birth: [birth year]           MRN: [id number]             PCP: Sandi Mealy, MD Referring: Sandi Mealy, MD Visit Date: 10/01/2018 Occupation: @GUAROCC @  Subjective:  No chief complaint on file.  Plaquenil 200 mg 1 tablet twice daily.  Last Plaquenil eye exam normal on 09/04/2018.  Most recent CBC/CMP stable on 01/14/2018.  She is overdue for labs.  CBC/CMP ordered for today and will monitor every 5 months.  Standing orders placed.  She received a flu vaccine in October and previously Prevnar 13 and Pneumovax 23.  Recommend flu vaccine annually.  History of Present Illness: Haley Goodman is a 38 y.o. female ***   Activities of Daily Living:  Patient reports morning stiffness for *** {minute/hour:19697}.   Patient {ACTIONS;DENIES/REPORTS:21021675::"Denies"} nocturnal pain.  Difficulty dressing/grooming: {ACTIONS;DENIES/REPORTS:21021675::"Denies"} Difficulty climbing stairs: {ACTIONS;DENIES/REPORTS:21021675::"Denies"} Difficulty getting out of chair: {ACTIONS;DENIES/REPORTS:21021675::"Denies"} Difficulty using hands for taps, buttons, cutlery, and/or writing: {ACTIONS;DENIES/REPORTS:21021675::"Denies"}  No Rheumatology ROS completed.   PMFS History:  Patient Active Problem List   Diagnosis Date Noted  . MDD (major depressive disorder), recurrent episode, moderate ([place]) 03/22/2017  . Encounter for therapeutic drug monitoring 01/11/2017  . Rheumatoid arthritis involving multiple sites with positive rheumatoid factor ([place]) 05/10/2016  . Fibromyalgia 05/10/2016  . Primary osteoarthritis of both hands 05/10/2016  . Chronic pain syndrome/ in pain manaement 05/10/2016  . High risk medication use/ Plaquenil 05/10/2016  . Spondylosis of lumbar region without myelopathy or radiculopathy 05/10/2016  . Abnormal serum protein electrophoresis 05/10/2016  . Vitamin D deficiency 05/10/2016  . Uncontrolled  type 2 diabetes mellitus with complication, without long-term current use of insulin ([place]) 03/13/2016  . Essential hypertension, benign 03/13/2016  . Morbid obesity ([place]) 03/13/2016  . Pulmonary embolism ([place]) 06/06/2011    Class: History of    Past Medical History:  Diagnosis Date  . Anemia   . Arthritis   . Fibromyalgia   . Hypertension   . Lupus ([place])   . PE (pulmonary embolism) 2009  . RA (rheumatoid arthritis) (HCC)     Family History  Problem Relation Age of Onset  . Stroke Mother   . Cancer Other   . Diabetes Other   . Pseudochol deficiency Neg Hx   . Malignant hyperthermia Neg Hx   . Hypotension Neg Hx   . Anesthesia problems Neg Hx    Past Surgical History:  Procedure Laterality Date  . ABLATION    . CHOLECYSTECTOMY    . DILATION AND CURETTAGE OF UTERUS    . TUBAL LIGATION     Social History   Social History Narrative  . Not on file    There is no immunization history on file for this patient.   Objective: Vital Signs: There were no vitals taken for this visit.   Physical Exam   Musculoskeletal Exam: ***  CDAI Exam: CDAI Score: - Patient Global: -; Provider Global: - Swollen: -; Tender: - Joint Exam   No joint exam has been documented for this visit   There is currently no information documented on the homunculus. Go to the Rheumatology activity and complete the homunculus joint exam.  Investigation: No additional findings.  Imaging: No results found.  Recent Labs: Lab Results  Component Value Date   WBC 7.3 01/14/2018   HGB 11.8 01/14/2018   PLT  253 01/14/2018   NA 139 01/14/2018   K 4.0 01/14/2018   CL 106 01/14/2018   CO2 26 01/14/2018   GLUCOSE 98 01/14/2018   BUN 9 01/14/2018   CREATININE 0.92 01/14/2018   BILITOT 0.2 01/14/2018   ALKPHOS 96 03/12/2009   AST 19 01/14/2018   ALT 17 01/14/2018   PROT 6.8 01/14/2018   ALBUMIN 4.0 03/12/2009   CALCIUM 8.8 01/14/2018   GFRAA 91 01/14/2018    Speciality Comments: PLQ Eye  Exam: 09/04/18 WNL @ [facility] follow up in 1 year  Procedures:  No procedures performed Allergies: Codeine, Compazine, Ivp dye [iodinated diagnostic agents], Lisinopril, Nitrofurantoin monohyd macro, Penicillins, and Sulfasalazine   Assessment / Plan:     Visit Diagnoses: No diagnosis found.  Orders: No orders of the defined types were placed in this encounter.  No orders of the defined types were placed in this encounter.   Face-to-face time spent with patient was *** minutes. Greater than 50% of time was spent in counseling and coordination of care.  Follow-Up Instructions: No follow-ups on file.   Ellen Henri, CMA  Note - This record has been created using Animal nutritionist.  Chart creation errors have been sought, but may not always  have been located. Such creation errors do not reflect on  the standard of medical care.

## 2018-10-01 ENCOUNTER — Ambulatory Visit: Payer: Medicare Other | Admitting: Physician Assistant

## 2018-10-02 DIAGNOSIS — J069 Acute upper respiratory infection, unspecified: Secondary | ICD-10-CM

## 2018-10-02 HISTORY — DX: Acute upper respiratory infection, unspecified: J06.9

## 2018-10-02 NOTE — Progress Notes (Signed)
Virtual Visit via Video Note  I connected with Sophira Johnson-Kidd on 10/11/18 at 12:00 PM EDT by a video enabled telemedicine application and verified that I am speaking with the correct person using two identifiers.  Location: Patient: at home  Provider: office  This service was conducted via virtual visit.  Both audio and visual tools were used.  The patient was located at home. I was located in my office.  Consent was obtained prior to the virtual visit and is aware of possible charges through their insurance for this visit.  The patient is an established patient.  Dr. Corliss Skains, MD conducted the virtual visit.  Office staff helped with scheduling follow up visits after the service was conducted.    I discussed the limitations of evaluation and management by telemedicine and the availability of in person appointments. The patient expressed understanding and agreed to proceed.  CC: insomnia, back pain   History of Present Illness: Patient is a 38 year old female with a past medical history of rheumatoid arthritis, fibromyalgia, osteoarthritis of both hands. Patient complains of joint pain in bilateral knees, feet and back pain. Per patient, she is undergoing the process for a breast reduction. Patient will follow up with pain management regarding insomnia and back pain. Patient states her pain management doctor has prescribed diclofenac sodium 75mg  BID. Patient no longer takes geodon or ativan.   Review of Systems  Constitutional: Negative for fever and malaise/fatigue.  Eyes: Negative for photophobia, pain, discharge and redness.  Respiratory: Negative for cough, shortness of breath and wheezing.   Cardiovascular: Negative for chest pain and palpitations.  Gastrointestinal: Negative for blood in stool, constipation and diarrhea.  Genitourinary: Negative for dysuria.  Musculoskeletal: Positive for back pain and joint pain. Negative for myalgias and neck pain.  Skin: Negative for rash.   Neurological: Negative for dizziness and headaches.  Psychiatric/Behavioral: Negative for depression. The patient has insomnia. The patient is not nervous/anxious.       Observations/Objective: Physical Exam  Constitutional: She is oriented to person, place, and time and well-developed, well-nourished, and in no distress.  HENT:  Head: Normocephalic and atraumatic.  Eyes: Conjunctivae are normal.  Pulmonary/Chest: Effort normal.  Neurological: She is alert and oriented to person, place, and time.  Psychiatric: Mood, memory, affect and judgment normal.   Patient reports morning stiffness for 30-60 minutes.   Patient reports nocturnal pain.  Difficulty dressing/grooming: Denies Difficulty climbing stairs: Reports Difficulty getting out of chair: Reports Difficulty using hands for taps, buttons, cutlery, and/or writing: Denies   Assessment and Plan: Visit Diagnoses: Rheumatoid arthritis involving multiple sites with positive rheumatoid factor (HCC) - +CCP: patient complains of joint pain but denies joint swelling.   High risk medication use - PLQ 200 mg 1 tablet BID. eye exam: 09/04/2018. Patient is due to update labs. We will send lab order to [facility] hospital per patient's request.   Fibromyalgia: She continues to have generalized pain and discomfort.  She states the pain is all over and has been worse that she has not been sleeping well.  Primary osteoarthritis of both hands: patient denies pain or swelling.   Other medical conditions are listed as follows:   Positive PPD  History of depression  History of vitamin D deficiency  History of hypertension  History of pulmonary embolism  History of psychosis   Follow Up Instructions: She will follow up in 5 months.    I discussed the assessment and treatment plan with the patient. The patient was  provided an opportunity to ask questions and all were answered. The patient agreed with the plan and demonstrated an  understanding of the instructions.   The patient was advised to call back or seek an in-person evaluation if the symptoms worsen or if the condition fails to improve as anticipated.  I provided 15 minutes of non-face-to-face time during this encounter.   Bo Merino, MD

## 2018-10-11 ENCOUNTER — Telehealth (INDEPENDENT_AMBULATORY_CARE_PROVIDER_SITE_OTHER): Payer: Medicare Other | Admitting: Rheumatology

## 2018-10-11 ENCOUNTER — Telehealth: Payer: Self-pay | Admitting: Rheumatology

## 2018-10-11 ENCOUNTER — Other Ambulatory Visit: Payer: Self-pay

## 2018-10-11 DIAGNOSIS — M0579 Rheumatoid arthritis with rheumatoid factor of multiple sites without organ or systems involvement: Secondary | ICD-10-CM

## 2018-10-11 DIAGNOSIS — Z79899 Other long term (current) drug therapy: Secondary | ICD-10-CM

## 2018-10-11 DIAGNOSIS — M797 Fibromyalgia: Secondary | ICD-10-CM

## 2018-10-11 DIAGNOSIS — R7611 Nonspecific reaction to tuberculin skin test without active tuberculosis: Secondary | ICD-10-CM

## 2018-10-11 DIAGNOSIS — Z8679 Personal history of other diseases of the circulatory system: Secondary | ICD-10-CM

## 2018-10-11 DIAGNOSIS — Z8659 Personal history of other mental and behavioral disorders: Secondary | ICD-10-CM

## 2018-10-11 DIAGNOSIS — M19041 Primary osteoarthritis, right hand: Secondary | ICD-10-CM | POA: Diagnosis not present

## 2018-10-11 DIAGNOSIS — M19042 Primary osteoarthritis, left hand: Secondary | ICD-10-CM

## 2018-10-11 DIAGNOSIS — Z8639 Personal history of other endocrine, nutritional and metabolic disease: Secondary | ICD-10-CM

## 2018-10-11 DIAGNOSIS — Z86711 Personal history of pulmonary embolism: Secondary | ICD-10-CM

## 2018-10-11 DIAGNOSIS — M62838 Other muscle spasm: Secondary | ICD-10-CM

## 2018-10-11 NOTE — Telephone Encounter (Signed)
-----   Message from [place] sent at 10/11/2018  2:34 PM EDT ----- Patient had a virtual visit today with Dr. Estanislado Pandy. Please call to schedule 5 month follow up. Thanks!

## 2018-10-11 NOTE — Telephone Encounter (Signed)
LMOM for patient to call and schedule 5 month follow-up appointment. 

## 2018-10-11 NOTE — Progress Notes (Signed)
Per patient's request, lab order will be mailed to patient's home address on file. Patient will go to [facility] hospital to have labs drawn.

## 2018-10-25 NOTE — Telephone Encounter (Signed)
Spoke with Haley Goodman who stated she will call back to schedule her follow-up appointment.

## 2018-12-12 DIAGNOSIS — R0789 Other chest pain: Secondary | ICD-10-CM | POA: Diagnosis not present

## 2018-12-12 DIAGNOSIS — R079 Chest pain, unspecified: Secondary | ICD-10-CM | POA: Diagnosis not present

## 2019-01-20 ENCOUNTER — Telehealth: Payer: Self-pay | Admitting: Rheumatology

## 2019-01-20 NOTE — Telephone Encounter (Signed)
Patient left a voicemail requesting to speak with Haley Goodman directly "regarding an Important matter."  Patient requested a return call.

## 2019-01-20 NOTE — Telephone Encounter (Signed)
Attempted to contact patient and advised patient to call the office.

## 2019-01-22 ENCOUNTER — Other Ambulatory Visit: Payer: Self-pay | Admitting: Rheumatology

## 2019-01-22 ENCOUNTER — Telehealth: Payer: Self-pay | Admitting: Rheumatology

## 2019-01-22 DIAGNOSIS — M0579 Rheumatoid arthritis with rheumatoid factor of multiple sites without organ or systems involvement: Secondary | ICD-10-CM

## 2019-01-22 NOTE — Telephone Encounter (Signed)
Last Visit: 10/11/2018 telemedicine  Next Visit: message sent to the front desk to schedule.  Labs: 11/28/2018 Eye exam: 09/04/2018  Attempted to contact patient and left message on machine for patient to call the office. PLQ has not been refilled since 05/2018.

## 2019-01-22 NOTE — Telephone Encounter (Signed)
-----   Message from [place] sent at 01/22/2019 10:06 AM EST ----- Please call patient to schedule follow up, patient is due Jan 2021. Thanks!

## 2019-01-22 NOTE — Telephone Encounter (Signed)
LMOM for patient to call and schedule follow-up appointment.   °

## 2019-02-06 ENCOUNTER — Telehealth: Payer: Self-pay | Admitting: Rheumatology

## 2019-02-06 MED ORDER — HYDROXYCHLOROQUINE SULFATE 200 MG PO TABS: 200.0000 mg | ORAL_TABLET | Freq: Two times a day (BID) | ORAL | 0 refills | Status: DC

## 2019-02-06 NOTE — Telephone Encounter (Signed)
Patient calling in refernece to Dioxycycline. Patient is having lesions on scalp, and needs medication to treat this. Patient uses [facility] in [place]. Patient is leaving today at 1:45pm to go out of town. Was hoping to get medication before then.

## 2019-02-06 NOTE — Telephone Encounter (Signed)
Patient should see her PCP for evaluation if she cannot see dermatologist immediately.

## 2019-02-06 NOTE — Telephone Encounter (Signed)
Patient returned the call and states she is taking PLQ as prescribed.

## 2019-02-06 NOTE — Telephone Encounter (Signed)
Patient states she noticed her scalp was painful on 02/04/2019 and now has 3 lesions on her scalp. Patient states they are very sore and she hasn't had these lesions in "years." patient states she previously saw a dermatologist years ago who treated them. Patient has not contacted the dermatologist. Patient is requesting doxycycline. Please advise.   Patient has RA and is on PLQ.

## 2019-02-06 NOTE — Telephone Encounter (Signed)
Spoke with patient and advised patient she should see her PCP for evaluation if she cannot see dermatologist immediately. Patient states she contacted her PCP first who advised her to call Dr. Estanislado Pandy. Patient reports she has not seen the dermatologist in about 8 years.  Spoke with Dr. Estanislado Pandy and advised patient to go to urgent care for evaluation and antibiotics she is requesting. Patient verbalized understanding.

## 2019-02-06 NOTE — Addendum Note (Signed)
Addended by: Earnestine Mealing on: 02/06/2019 10:53 AM   Modules accepted: Orders

## 2019-03-25 NOTE — Progress Notes (Signed)
Virtual Visit via Video Note  I connected with Haley Goodman on 04/02/19 at 10:45 AM EST by a video enabled telemedicine application and verified that I am speaking with the correct person using two identifiers.  Location: Patient: Home Provider: Clinic  This service was conducted via virtual visit.  Both audio and visual tools were used.  The patient was located at home. I was located in my office.  Consent was obtained prior to the virtual visit and is aware of possible charges through their insurance for this visit.  The patient is an established patient.  Dr. Corliss Skains, MD conducted the virtual visit and Sherron Ales, PA-C acted as scribe during the service.  Office staff helped with scheduling follow up visits after the service was conducted.     I discussed the limitations of evaluation and management by telemedicine and the availability of in person appointments. The patient expressed understanding and agreed to proceed.  CC: Cough  History of Present Illness: Patient is a 39 year old female with a past medical history of seropositive rheumatoid arthritis, osteoarthritis, and fibromyalgia. She is taking plaquenil 200 mg 1 tablet by mouth twice daily. She denies any recent rheumatoid arthritis flares. She is not having any joint pain or joint swelling.  She has morning stiffness for 1 hour at least daily. She continues to have generalized muscle aches and muscle tenderness due to fibromyalgia.  She states she has been having difficulty breathing and coughing due to a recent  Asthma exacerbation.  She was evaluated by her PCP and was prescribed prednisone and albuterol nebulizer refills.  She has not been evaluated by a pulmonologist yet. She will going to stay in [place] until May to be in warmer weather since the cooler weather has been a trigger for her exacerbations.   Review of Systems  Constitutional: Positive for malaise/fatigue. Negative for fever.  Eyes: Negative for  photophobia, pain, discharge and redness.  Respiratory: Positive for cough and wheezing. Negative for shortness of breath.   Cardiovascular: Negative for chest pain and palpitations.  Gastrointestinal: Negative for blood in stool, constipation and diarrhea.  Genitourinary: Negative for dysuria.  Musculoskeletal: Positive for myalgias. Negative for back pain, joint pain and neck pain.       +Morning stiffness   Skin: Negative for rash.  Neurological: Negative for dizziness and headaches.  Psychiatric/Behavioral: Negative for depression. The patient has insomnia. The patient is not nervous/anxious.       Observations/Objective: Physical Exam  Constitutional: She is oriented to person, place, and time and well-developed, well-nourished, and in no distress.  HENT:  Head: Normocephalic and atraumatic.  Eyes: Conjunctivae are normal.  Pulmonary/Chest: Effort normal.  Neurological: She is alert and oriented to person, place, and time.  Psychiatric: Mood, memory, affect and judgment normal.   Patient reports morning stiffness for 1  hour.   Patient reports nocturnal pain.  Difficulty dressing/grooming: Denies Difficulty climbing stairs: Reports Difficulty getting out of chair: Reports Difficulty using hands for taps, buttons, cutlery, and/or writing: Denies   Assessment and Plan: Visit Diagnoses:Rheumatoid arthritis involving multiple sites with positive rheumatoid factor (HCC)- +CCP:She has not had any recent rheumatoid arthritis flares. She has no joint pain or joint swelling at this time. She is clinically doing well plaquenil 200 mg 1 tablet by mouth twice daily.  She will continue on the current treatment regimen.  She was advised to notify us if she develops increased joint pain or joint swelling. She will follow up in 5 months.  High risk medication use - PLQ 200 mg 1 tablet BID.PLQ eye exam: 09/04/2018. CBC and CMP were drawn on 01/09/19.  She will be due to update lab work in  April 2021 and every 5 months.   Fibromyalgia: She has generalized muscle aches and muscle tenderness due to fibromyalgia.  She takes Cymbalta 60 mg 1 capsule by mouth daily.  She continues to take percocet as needed for pain relief and belsomra 10 mg po at bedtime for insomnia.   Primary osteoarthritis of both hands: She is not having any joint pain or inflammation at this time. Joint protection and muscle strengthening were discussed.   Moderate persistent asthma with acute exacerbation: She has been experiencing coughing, wheezing, and difficulty breathing due to a recent asthma exacerbation.  She was evaluated in the ED and by her PCP recently. She was prescribed a prednisone taper and was given refills of albuterol for her nebulizer.  She has not established care with a pulmonologist yet. She will be going to [place] until May 2021 to be in warmer weather to see if it helps with her asthma.  Other medical conditions are listed as follows:  Positive PPD  History of depression  History of vitamin D deficiency  History of hypertension  History of pulmonary embolism  History of psychosis  Follow Up Instructions: She will follow up in 5 months.   I discussed the assessment and treatment plan with the patient. The patient was provided an opportunity to ask questions and all were answered. The patient agreed with the plan and demonstrated an understanding of the instructions.   The patient was advised to call back or seek an in-person evaluation if the symptoms worsen or if the condition fails to improve as anticipated.  I provided 20 minutes of non-face-to-face time during this encounter.   Bo Merino, MD   Scribed byLovena Le Dale,PA-C

## 2019-03-26 ENCOUNTER — Telehealth: Payer: Self-pay | Admitting: Rheumatology

## 2019-03-26 NOTE — Telephone Encounter (Signed)
I returned patient's call and discussed that she needs to be evaluated before she can get prednisone taper.  She has an appointment with her PCP tomorrow.  If her symptoms get worse she can be seen at urgent care or emergency room.

## 2019-03-26 NOTE — Telephone Encounter (Signed)
Patient states she is having "inflammation in her chest and lungs." Patient states she was hospitalized 02/12/19-02/14/19 in [place]. Patient states she was having a flare that severe and interfered with her breathing. Patient states she was tested for Covid and it was negative. Patient she is having tightness in her chest wall. Patient states she has a cough. Patient states she was also recently diagnosed with Asthma. Patient has a follow up with PCP tomorrow 03/27/19. Patient is requesting a prednisone taper.  Please advise.

## 2019-03-26 NOTE — Telephone Encounter (Signed)
Patient called requesting prescription of Prednisone taper to be sent to [facility] at [address] in [place].  Patient states she has inflammation in her chest and lungs due to flair-up.  Patient states she had a COVID test which was negative.

## 2019-04-02 ENCOUNTER — Encounter: Payer: Self-pay | Admitting: Rheumatology

## 2019-04-02 ENCOUNTER — Other Ambulatory Visit: Payer: Self-pay

## 2019-04-02 ENCOUNTER — Telehealth (INDEPENDENT_AMBULATORY_CARE_PROVIDER_SITE_OTHER): Payer: Medicare Other | Admitting: Rheumatology

## 2019-04-02 DIAGNOSIS — Z8679 Personal history of other diseases of the circulatory system: Secondary | ICD-10-CM

## 2019-04-02 DIAGNOSIS — M797 Fibromyalgia: Secondary | ICD-10-CM | POA: Diagnosis not present

## 2019-04-02 DIAGNOSIS — J4541 Moderate persistent asthma with (acute) exacerbation: Secondary | ICD-10-CM

## 2019-04-02 DIAGNOSIS — M19041 Primary osteoarthritis, right hand: Secondary | ICD-10-CM

## 2019-04-02 DIAGNOSIS — M0579 Rheumatoid arthritis with rheumatoid factor of multiple sites without organ or systems involvement: Secondary | ICD-10-CM

## 2019-04-02 DIAGNOSIS — Z79899 Other long term (current) drug therapy: Secondary | ICD-10-CM | POA: Diagnosis not present

## 2019-04-02 DIAGNOSIS — M62838 Other muscle spasm: Secondary | ICD-10-CM

## 2019-04-02 DIAGNOSIS — R7611 Nonspecific reaction to tuberculin skin test without active tuberculosis: Secondary | ICD-10-CM

## 2019-04-02 DIAGNOSIS — Z8659 Personal history of other mental and behavioral disorders: Secondary | ICD-10-CM

## 2019-04-02 DIAGNOSIS — Z86711 Personal history of pulmonary embolism: Secondary | ICD-10-CM

## 2019-04-02 DIAGNOSIS — M19042 Primary osteoarthritis, left hand: Secondary | ICD-10-CM

## 2019-04-02 DIAGNOSIS — Z8639 Personal history of other endocrine, nutritional and metabolic disease: Secondary | ICD-10-CM

## 2019-06-11 DIAGNOSIS — M545 Low back pain, unspecified: Secondary | ICD-10-CM

## 2019-06-11 DIAGNOSIS — G8929 Other chronic pain: Secondary | ICD-10-CM | POA: Insufficient documentation

## 2019-06-11 HISTORY — DX: Low back pain, unspecified: M54.50

## 2019-07-14 DIAGNOSIS — N719 Inflammatory disease of uterus, unspecified: Secondary | ICD-10-CM | POA: Insufficient documentation

## 2019-07-23 ENCOUNTER — Telehealth: Payer: Self-pay | Admitting: Rheumatology

## 2019-07-23 DIAGNOSIS — M0579 Rheumatoid arthritis with rheumatoid factor of multiple sites without organ or systems involvement: Secondary | ICD-10-CM

## 2019-07-23 MED ORDER — HYDROXYCHLOROQUINE SULFATE 200 MG PO TABS: 200.0000 mg | ORAL_TABLET | Freq: Two times a day (BID) | ORAL | 0 refills | Status: DC

## 2019-07-23 NOTE — Telephone Encounter (Signed)
Patient called requesting prescription refill of Hydroxychloroquine to be sent to [facility] at [address] in [place].

## 2019-07-23 NOTE — Telephone Encounter (Addendum)
Last visit: 04/02/2019 Next visit: 09/02/2019 Labs: 03/07/2019 RDW 14.9, MCHC 30.6, CO2 20.8, Glucose 141 PLQ Eye Exam: 09/04/2018 WNL  Current Dose per office note on 04/02/2019:  PLQ 200 mg 1 tablet BID

## 2019-07-25 DIAGNOSIS — K219 Gastro-esophageal reflux disease without esophagitis: Secondary | ICD-10-CM | POA: Insufficient documentation

## 2019-07-25 DIAGNOSIS — J309 Allergic rhinitis, unspecified: Secondary | ICD-10-CM | POA: Insufficient documentation

## 2019-07-25 HISTORY — DX: Allergic rhinitis, unspecified: J30.9

## 2019-07-25 HISTORY — DX: Gastro-esophageal reflux disease without esophagitis: K21.9

## 2019-08-25 NOTE — Progress Notes (Deleted)
Office Visit Note  Patient: Haley Goodman             Date of Birth: 1980-09-18           MRN: [id number]             PCP: Suzan Slick, MD Referring: Suzan Slick, MD Visit Date: 09/02/2019 Occupation: @GUAROCC @  Subjective:  No chief complaint on file.   History of Present Illness: Haley Goodman is a 39 y.o. female ***   Activities of Daily Living:  Patient reports morning stiffness for *** {minute/hour:19697}.   Patient {ACTIONS;DENIES/REPORTS:21021675::"Denies"} nocturnal pain.  Difficulty dressing/grooming: {ACTIONS;DENIES/REPORTS:21021675::"Denies"} Difficulty climbing stairs: {ACTIONS;DENIES/REPORTS:21021675::"Denies"} Difficulty getting out of chair: {ACTIONS;DENIES/REPORTS:21021675::"Denies"} Difficulty using hands for taps, buttons, cutlery, and/or writing: {ACTIONS;DENIES/REPORTS:21021675::"Denies"}  No Rheumatology ROS completed.   PMFS History:  Patient Active Problem List   Diagnosis Date Noted  . MDD (major depressive disorder), recurrent episode, moderate (HCC) 03/22/2017  . Encounter for therapeutic drug monitoring 01/11/2017  . Rheumatoid arthritis involving multiple sites with positive rheumatoid factor (HCC) 05/10/2016  . Fibromyalgia 05/10/2016  . Primary osteoarthritis of both hands 05/10/2016  . Chronic pain syndrome/ in pain manaement 05/10/2016  . High risk medication use/ Plaquenil 05/10/2016  . Spondylosis of lumbar region without myelopathy or radiculopathy 05/10/2016  . Abnormal serum protein electrophoresis 05/10/2016  . Vitamin D deficiency 05/10/2016  . Uncontrolled type 2 diabetes mellitus with complication, without long-term current use of insulin (HCC) 03/13/2016  . Essential hypertension, benign 03/13/2016  . Morbid obesity (HCC) 03/13/2016  . Pulmonary embolism (HCC) 06/06/2011    Class: History of    Past Medical History:  Diagnosis Date  . Anemia   . Arthritis   . Fibromyalgia   . Hypertension   .  Lupus (HCC)   . PE (pulmonary embolism) 2009  . RA (rheumatoid arthritis) (HCC)     Family History  Problem Relation Age of Onset  . Stroke Mother   . Cancer Other   . Diabetes Other   . Pseudochol deficiency Neg Hx   . Malignant hyperthermia Neg Hx   . Hypotension Neg Hx   . Anesthesia problems Neg Hx    Past Surgical History:  Procedure Laterality Date  . ABLATION    . CHOLECYSTECTOMY    . DILATION AND CURETTAGE OF UTERUS    . TUBAL LIGATION     Social History   Social History Narrative  . Not on file   Immunization History  Administered Date(s) Administered  . Influenza Split 01/26/2014  . Influenza, Seasonal, Injecte, Preservative Fre 12/05/2010, 12/23/2015  . Influenza,inj,Quad PF,6+ Mos 12/05/2017  . Influenza-Unspecified 03/30/2008, 11/22/2009, 02/11/2013, 12/18/2013, 11/25/2014  . PPD Test 10/03/2017  . Pneumococcal Conjugate-13 02/27/2013  . Pneumococcal Polysaccharide-23 04/07/2011  . Tdap 09/10/2013     Objective: Vital Signs: There were no vitals taken for this visit.   Physical Exam   Musculoskeletal Exam: ***  CDAI Exam: CDAI Score: -- Patient Global: --; Provider Global: -- Swollen: --; Tender: -- Joint Exam 09/02/2019   No joint exam has been documented for this visit   There is currently no information documented on the homunculus. Go to the Rheumatology activity and complete the homunculus joint exam.  Investigation: No additional findings.  Imaging: No results found.  Recent Labs: Lab Results  Component Value Date   WBC 7.3 01/14/2018   HGB 11.8 01/14/2018   PLT 253 01/14/2018   NA 139 01/14/2018   K 4.0 01/14/2018   CL 106 01/14/2018  CO2 26 01/14/2018   GLUCOSE 98 01/14/2018   BUN 9 01/14/2018   CREATININE 0.92 01/14/2018   BILITOT 0.2 01/14/2018   ALKPHOS 96 03/12/2009   AST 19 01/14/2018   ALT 17 01/14/2018   PROT 6.8 01/14/2018   ALBUMIN 4.0 03/12/2009   CALCIUM 8.8 01/14/2018   GFRAA 91 01/14/2018     Speciality Comments: PLQ Eye Exam: 09/04/18 WNL @ [facility] follow up in 1 year  Procedures:  No procedures performed Allergies: Codeine, Compazine, Ivp dye [iodinated diagnostic agents], Lisinopril, Nitrofurantoin monohyd macro, Penicillins, and Sulfasalazine   Assessment / Plan:     Visit Diagnoses: No diagnosis found.  Orders: No orders of the defined types were placed in this encounter.  No orders of the defined types were placed in this encounter.   Face-to-face time spent with patient was *** minutes. Greater than 50% of time was spent in counseling and coordination of care.  Follow-Up Instructions: No follow-ups on file.   Earnestine Mealing, CMA  Note - This record has been created using Editor, commissioning.  Chart creation errors have been sought, but may not always  have been located. Such creation errors do not reflect on  the standard of medical care.

## 2019-09-02 ENCOUNTER — Ambulatory Visit: Payer: Medicare Other | Admitting: Physician Assistant

## 2019-09-12 ENCOUNTER — Encounter: Payer: Self-pay | Admitting: Physician Assistant

## 2019-09-12 ENCOUNTER — Other Ambulatory Visit: Payer: Self-pay

## 2019-09-12 ENCOUNTER — Ambulatory Visit: Payer: Self-pay

## 2019-09-12 ENCOUNTER — Ambulatory Visit (INDEPENDENT_AMBULATORY_CARE_PROVIDER_SITE_OTHER): Payer: 59 | Admitting: Physician Assistant

## 2019-09-12 VITALS — BP 124/91 | HR 91 | Resp 14 | Ht 67.0 in | Wt 219.4 lb

## 2019-09-12 DIAGNOSIS — M79671 Pain in right foot: Secondary | ICD-10-CM | POA: Diagnosis not present

## 2019-09-12 DIAGNOSIS — M79672 Pain in left foot: Secondary | ICD-10-CM | POA: Diagnosis not present

## 2019-09-12 DIAGNOSIS — Z8659 Personal history of other mental and behavioral disorders: Secondary | ICD-10-CM

## 2019-09-12 DIAGNOSIS — Z8639 Personal history of other endocrine, nutritional and metabolic disease: Secondary | ICD-10-CM

## 2019-09-12 DIAGNOSIS — Z79899 Other long term (current) drug therapy: Secondary | ICD-10-CM

## 2019-09-12 DIAGNOSIS — M19042 Primary osteoarthritis, left hand: Secondary | ICD-10-CM

## 2019-09-12 DIAGNOSIS — Z86711 Personal history of pulmonary embolism: Secondary | ICD-10-CM

## 2019-09-12 DIAGNOSIS — J4541 Moderate persistent asthma with (acute) exacerbation: Secondary | ICD-10-CM

## 2019-09-12 DIAGNOSIS — M25551 Pain in right hip: Secondary | ICD-10-CM

## 2019-09-12 DIAGNOSIS — R7611 Nonspecific reaction to tuberculin skin test without active tuberculosis: Secondary | ICD-10-CM

## 2019-09-12 DIAGNOSIS — M19041 Primary osteoarthritis, right hand: Secondary | ICD-10-CM

## 2019-09-12 DIAGNOSIS — M62838 Other muscle spasm: Secondary | ICD-10-CM

## 2019-09-12 DIAGNOSIS — Z8679 Personal history of other diseases of the circulatory system: Secondary | ICD-10-CM

## 2019-09-12 DIAGNOSIS — M797 Fibromyalgia: Secondary | ICD-10-CM | POA: Diagnosis not present

## 2019-09-12 DIAGNOSIS — M0579 Rheumatoid arthritis with rheumatoid factor of multiple sites without organ or systems involvement: Secondary | ICD-10-CM

## 2019-09-12 NOTE — Progress Notes (Signed)
Office Visit Note  Patient: Haley Goodman             Date of Birth: 1980-03-11           MRN: [id number]             PCP: Suzan Slick, MD Referring: Suzan Slick, MD Visit Date: 09/12/2019 Occupation: @GUAROCC @  Subjective:  Right hip pain   History of Present Illness: Yaqueline Gomm is a 39 y.o. female with history of seropositive rheumatoid arthritis.  She is taking plaquenil 200 mg 1 tablet by mouth twice daily.  She denies any recent rheumatoid arthritis flares.  She states for the past 5 days she has been experiencing severe right hip pain. She states she woke up Monday morning with the discomfort. She states the pain is radiating down the right leg but she denies any groin pain. She is having trapezius muscle tension and tenderness bilaterally.  She denies any other joint pain or joint swelling.   Activities of Daily Living:  Patient reports joint stiffness all day  Patient Reports nocturnal pain.  Difficulty dressing/grooming: Denies Difficulty climbing stairs: Reports Difficulty getting out of chair: Denies Difficulty using hands for taps, buttons, cutlery, and/or writing: Denies  Review of Systems  Constitutional: Negative for fatigue.  HENT: Positive for mouth dryness. Negative for mouth sores and nose dryness.   Eyes: Positive for dryness. Negative for pain and visual disturbance.  Respiratory: Negative for cough, hemoptysis, shortness of breath and difficulty breathing.   Cardiovascular: Negative for chest pain, palpitations, hypertension and swelling in legs/feet.  Gastrointestinal: Positive for constipation. Negative for blood in stool and diarrhea.  Endocrine: Positive for heat intolerance. Negative for increased urination.  Genitourinary: Negative for difficulty urinating and painful urination.  Musculoskeletal: Positive for arthralgias, joint pain, joint swelling, muscle weakness, morning stiffness and muscle tenderness. Negative for  myalgias and myalgias.  Skin: Negative for color change, pallor, rash, hair loss, nodules/bumps, skin tightness, ulcers and sensitivity to sunlight.  Allergic/Immunologic: Negative for susceptible to infections.  Neurological: Negative for dizziness, numbness and headaches.  Hematological: Positive for bruising/bleeding tendency. Negative for swollen glands.  Psychiatric/Behavioral: Positive for sleep disturbance. Negative for depressed mood. The patient is not nervous/anxious.     PMFS History:  Patient Active Problem List   Diagnosis Date Noted  . MDD (major depressive disorder), recurrent episode, moderate (HCC) 03/22/2017  . Encounter for therapeutic drug monitoring 01/11/2017  . Rheumatoid arthritis involving multiple sites with positive rheumatoid factor (HCC) 05/10/2016  . Fibromyalgia 05/10/2016  . Primary osteoarthritis of both hands 05/10/2016  . Chronic pain syndrome/ in pain manaement 05/10/2016  . High risk medication use/ Plaquenil 05/10/2016  . Spondylosis of lumbar region without myelopathy or radiculopathy 05/10/2016  . Abnormal serum protein electrophoresis 05/10/2016  . Vitamin D deficiency 05/10/2016  . Uncontrolled type 2 diabetes mellitus with complication, without long-term current use of insulin (HCC) 03/13/2016  . Essential hypertension, benign 03/13/2016  . Morbid obesity (HCC) 03/13/2016  . Pulmonary embolism (HCC) 06/06/2011    Class: History of    Past Medical History:  Diagnosis Date  . Anemia   . Aneurysm (HCC)   . Arthritis   . Fibromyalgia   . Hypertension   . Lupus (HCC)   . PE (pulmonary embolism) 2009  . RA (rheumatoid arthritis) (HCC)     Family History  Problem Relation Age of Onset  . Stroke Mother   . Cancer Mother   . Cancer Other   .  Diabetes Other   . Cancer Sister   . Pseudochol deficiency Neg Hx   . Malignant hyperthermia Neg Hx   . Hypotension Neg Hx   . Anesthesia problems Neg Hx    Past Surgical History:  Procedure  Laterality Date  . ABLATION    . CHOLECYSTECTOMY    . DILATION AND CURETTAGE OF UTERUS    . TUBAL LIGATION     Social History   Social History Narrative  . Not on file   Immunization History  Administered Date(s) Administered  . Influenza Split 01/26/2014  . Influenza, Seasonal, Injecte, Preservative Fre 12/05/2010, 12/23/2015  . Influenza,inj,Quad PF,6+ Mos 12/05/2017  . Influenza-Unspecified 03/30/2008, 11/22/2009, 02/11/2013, 12/18/2013, 11/25/2014  . PPD Test 10/03/2017  . Pneumococcal Conjugate-13 02/27/2013  . Pneumococcal Polysaccharide-23 04/07/2011  . Tdap 09/10/2013     Objective: Vital Signs: BP (!) 124/91 (BP Location: Left Arm, Patient Position: Sitting, Cuff Size: Normal)   Pulse 91   Resp 14   Ht 5\' 7"  (1.702 m)   Wt 219 lb 6.4 oz (99.5 kg)   BMI 34.36 kg/m    Physical Exam Vitals and nursing note reviewed.  Constitutional:      Appearance: She is well-developed.  HENT:     Head: Normocephalic and atraumatic.  Eyes:     Conjunctiva/sclera: Conjunctivae normal.  Pulmonary:     Effort: Pulmonary effort is normal.  Abdominal:     General: Bowel sounds are normal.     Palpations: Abdomen is soft.  Musculoskeletal:     Cervical back: Normal range of motion.  Lymphadenopathy:     Cervical: No cervical adenopathy.  Skin:    General: Skin is warm and dry.     Capillary Refill: Capillary refill takes less than 2 seconds.  Neurological:     Mental Status: She is alert and oriented to person, place, and time.  Psychiatric:        Behavior: Behavior normal.      Musculoskeletal Exam: C-spine, thoracic spine, and lumbar spine good ROM.  No midline spinal tenderness.  No SI joint tenderness.  Shoulder joints, elbow joints, wrist joints, MCPs, PIPs, and DIPs good ROM with no synovitis.  Complete fist formation bilaterally.  Right hip has limited ROM with discomfort.  Tenderness over the right trochanteric bursa.  Left hip has good ROM with no discomfort.   Knee joints and ankle joints have good ROM with no discomfort.  No warmth or effusion of knee joints.  No tenderness or swelling of ankle joints.   CDAI Exam: CDAI Score: -- Patient Global: --; Provider Global: -- Swollen: --; Tender: -- Joint Exam 09/12/2019   No joint exam has been documented for this visit   There is currently no information documented on the homunculus. Go to the Rheumatology activity and complete the homunculus joint exam.  Investigation: No additional findings.  Imaging: No results found.  Recent Labs: Lab Results  Component Value Date   WBC 7.3 01/14/2018   HGB 11.8 01/14/2018   PLT 253 01/14/2018   NA 139 01/14/2018   K 4.0 01/14/2018   CL 106 01/14/2018   CO2 26 01/14/2018   GLUCOSE 98 01/14/2018   BUN 9 01/14/2018   CREATININE 0.92 01/14/2018   BILITOT 0.2 01/14/2018   ALKPHOS 96 03/12/2009   AST 19 01/14/2018   ALT 17 01/14/2018   PROT 6.8 01/14/2018   ALBUMIN 4.0 03/12/2009   CALCIUM 8.8 01/14/2018   GFRAA 91 01/14/2018    Speciality Comments:  PLQ Eye Exam: 09/04/18 WNL @ [facility] follow up in 1 year  Procedures:  No procedures performed Allergies: Codeine, Lacosamide, Penicillins, Prochlorperazine, Sulfa antibiotics, Cetirizine, Iodinated diagnostic agents, Lisinopril, Naproxen, Nitrofurantoin, Other, Pregabalin, Compazine, Gabapentin, Nitrofurantoin monohyd macro, Sulfasalazine, and Dexamethasone   Assessment / Plan:     Visit Diagnoses: Rheumatoid arthritis involving multiple sites with positive rheumatoid factor (HCC) - She has no synovitis on exam.  She has not had any recent rheumatoid arthritis flares.  She is clinically doing well on Plaquenil 200 mg 1 tablet by mouth twice daily.  She presents today with right hip pain which started about 5 days ago.  She has tenderness over the right trochanteric bursa but does have limited range of motion of the right hip.  X-rays of the right hip joint were obtained today.  She is  not having any other joint pain or inflammation at this time.  X-rays of both hands and feet were updated today to assess for interval change.  She will continue taking Plaquenil 200 mg 1 tablet by mouth twice daily.  She was advised to notify us if she develops increased joint pain or joint swelling.  She will follow-up in the office in 5 months.  Plan: XR Hand 2 View Left, XR Hand 2 View Right, XR Foot 2 Views Right, XR Foot 2 Views Left.  X-ray of bilateral hands showed mild juxta-articular osteopenia.  X-ray of bilateral feet showed some osteoarthritic changes.  No erosive changes were noted.  High risk medication use -Plaquenil 200 mg 1 tablet by mouth twice daily. PLQ Eye Exam: 09/04/18 WNL @ [facility] follow up in 1 year.  She is due to update Plaquenil eye exam.  CBC and CMP were drawn on 11/28/2018.  She is due to update lab work today.  Orders for CBC and CMP were released.  She will be due to update lab work every 5 months to monitor for drug toxicity.  Plan: CBC with Differential/Platelet, COMPLETE METABOLIC PANEL WITH GFR  Fibromyalgia: She has generalized hyperalgesia and positive tender points on exam.  She is experiencing trapezius muscle tension and muscle tenderness bilaterally.  She is having spasms intermittently.  She is also having discomfort due to right trochanteric bursitis.  She was given a handout of exercises to perform.  We discussed the importance of regular exercise and good sleep hygiene.   Primary osteoarthritis of both hands: She is not having any discomfort in her hands at this time.  She has no inflammation on exam.  Joint protection and muscle strengthening were discussed.  Pain in right hip - She has been experiencing pain in the right hip for the past 5 days.  According to the patient she woke up with the discomfort.  She states that she typically either sleeps on her right side or on her back.  She has tenderness to palpation over the right trochanteric  bursa.  She also has limited range of motion of the right hip joint.  She has not been experiencing any groin pain recently.  X-rays of the right hip were obtained today.  X-ray of the hip joint was unremarkable.  She was given a handout of exercises to perform.  She was advised to notify us if her symptoms persist or worsen.  Plan: XR HIP UNILAT W OR W/O PELVIS 2-3 VIEWS RIGHT  Positive PPD  History of vitamin D deficiency: She is taking a vitamin D supplement.  Trapezius muscle spasm: She is experiencing  trapezius muscle tension and muscle tenderness bilaterally.  She has been having spasms intermittently.  She takes Flexeril 10 mg every 8 hours as needed for muscle spasms.  Other medical conditions are listed as follows:   History of hypertension  History of pulmonary embolism  History of depression  Moderate persistent asthma with acute exacerbation  History of psychosis    Orders: Orders Placed This Encounter  Procedures  . XR HIP UNILAT W OR W/O PELVIS 2-3 VIEWS RIGHT  . XR Hand 2 View Left  . XR Hand 2 View Right  . XR Foot 2 Views Right  . XR Foot 2 Views Left  . CBC with Differential/Platelet  . COMPLETE METABOLIC PANEL WITH GFR   No orders of the defined types were placed in this encounter.   Face-to-face time spent with patient was 30 minutes. Greater than 50% of time was spent in counseling and coordination of care.  Follow-Up Instructions: Return in about 5 months (around 02/12/2020) for Rheumatoid arthritis, Fibromyalgia.   Gearldine Bienenstock, PA-C  Note - This record has been created using Dragon software.  Chart creation errors have been sought, but may not always  have been located. Such creation errors do not reflect on  the standard of medical care.

## 2019-09-12 NOTE — Progress Notes (Signed)
I called the patient to discuss X-ray results.  We will repeat x-rays of both hands and both feet in 2-3 years to assess for radiographic progression.  All questions were addressed.

## 2019-09-12 NOTE — Patient Instructions (Addendum)
Hip Bursitis Rehab Ask your health care provider which exercises are safe for you. Do exercises exactly as told by your health care provider and adjust them as directed. It is normal to feel mild stretching, pulling, tightness, or discomfort as you do these exercises. Stop right away if you feel sudden pain or your pain gets worse. Do not begin these exercises until told by your health care provider. Stretching exercise This exercise warms up your muscles and joints and improves the movement and flexibility of your hip. This exercise also helps to relieve pain and stiffness. Iliotibial band stretch An iliotibial band is a strong band of muscle tissue that runs from the outer side of your hip to the outer side of your thigh and knee. 1. Lie on your side with your left / right leg in the top position. 2. Bend your left / right knee and grab your ankle. Stretch out your bottom arm to help you balance. 3. Slowly bring your knee back so your thigh is behind your body. 4. Slowly lower your knee toward the floor until you feel a gentle stretch on the outside of your left / right thigh. If you do not feel a stretch and your knee will not fall farther, place the heel of your other foot on top of your knee and pull your knee down toward the floor with your foot. 5. Hold this position for __________ seconds. 6. Slowly return to the starting position. Repeat __________ times. Complete this exercise __________ times a day. Strengthening exercises These exercises build strength and endurance in your hip and pelvis. Endurance is the ability to use your muscles for a long time, even after they get tired. Bridge This exercise strengthens the muscles that move your thigh backward (hip extensors). 1. Lie on your back on a firm surface with your knees bent and your feet flat on the floor. 2. Tighten your buttocks muscles and lift your buttocks off the floor until your trunk is level with your thighs. ? Do not arch  your back. ? You should feel the muscles working in your buttocks and the back of your thighs. If you do not feel these muscles, slide your feet 1-2 inches (2.5-5 cm) farther away from your buttocks. ? If this exercise is too easy, try doing it with your arms crossed over your chest. 3. Hold this position for __________ seconds. 4. Slowly lower your hips to the starting position. 5. Let your muscles relax completely after each repetition. Repeat __________ times. Complete this exercise __________ times a day. Squats This exercise strengthens the muscles in front of your thigh and knee (quadriceps). 1. Stand in front of a table, with your feet and knees pointing straight ahead. You may rest your hands on the table for balance but not for support. 2. Slowly bend your knees and lower your hips like you are going to sit in a chair. ? Keep your weight over your heels, not over your toes. ? Keep your lower legs upright so they are parallel with the table legs. ? Do not let your hips go lower than your knees. ? Do not bend lower than told by your health care provider. ? If your hip pain increases, do not bend as low. 3. Hold the squat position for __________ seconds. 4. Slowly push with your legs to return to standing. Do not use your hands to pull yourself to standing. Repeat __________ times. Complete this exercise __________ times a day. Hip hike 1. Stand   sideways on a bottom step. Stand on your left / right leg with your other foot unsupported next to the step. You can hold on to the railing or wall for balance if needed. 2. Keep your knees straight and your torso square. Then lift your left / right hip up toward the ceiling. 3. Hold this position for __________ seconds. 4. Slowly let your left / right hip lower toward the floor, past the starting position. Your foot should get closer to the floor. Do not lean or bend your knees. Repeat __________ times. Complete this exercise __________ times a  day. Single leg stand 1. Without shoes, stand near a railing or in a doorway. You may hold on to the railing or door frame as needed for balance. 2. Squeeze your left / right buttock muscles, then lift up your other foot. ? Do not let your left / right hip push out to the side. ? It is helpful to stand in front of a mirror for this exercise so you can watch your hip. 3. Hold this position for __________ seconds. Repeat __________ times. Complete this exercise __________ times a day. This information is not intended to replace advice given to you by your health care provider. Make sure you discuss any questions you have with your health care provider. Document Revised: 06/10/2018 Document Reviewed: 06/10/2018 Elsevier Patient Education  2020 [employer]  Hip Bursitis Rehab Ask your health care provider which exercises are safe for you. Do exercises exactly as told by your health care provider and adjust them as directed. It is normal to feel mild stretching, pulling, tightness, or discomfort as you do these exercises. Stop right away if you feel sudden pain or your pain gets worse. Do not begin these exercises until told by your health care provider. Stretching exercise This exercise warms up your muscles and joints and improves the movement and flexibility of your hip. This exercise also helps to relieve pain and stiffness. Iliotibial band stretch An iliotibial band is a strong band of muscle tissue that runs from the outer side of your hip to the outer side of your thigh and knee. 7. Lie on your side with your left / right leg in the top position. 8. Bend your left / right knee and grab your ankle. Stretch out your bottom arm to help you balance. 9. Slowly bring your knee back so your thigh is behind your body. 10. Slowly lower your knee toward the floor until you feel a gentle stretch on the outside of your left / right thigh. If you do not feel a stretch and your knee will not fall  farther, place the heel of your other foot on top of your knee and pull your knee down toward the floor with your foot. 11. Hold this position for __________ seconds. 12. Slowly return to the starting position. Repeat __________ times. Complete this exercise __________ times a day. Strengthening exercises These exercises build strength and endurance in your hip and pelvis. Endurance is the ability to use your muscles for a long time, even after they get tired. Bridge This exercise strengthens the muscles that move your thigh backward (hip extensors). 6. Lie on your back on a firm surface with your knees bent and your feet flat on the floor. 7. Tighten your buttocks muscles and lift your buttocks off the floor until your trunk is level with your thighs. ? Do not arch your back. ? You should feel the muscles working in your buttocks  and the back of your thighs. If you do not feel these muscles, slide your feet 1-2 inches (2.5-5 cm) farther away from your buttocks. ? If this exercise is too easy, try doing it with your arms crossed over your chest. 8. Hold this position for __________ seconds. 9. Slowly lower your hips to the starting position. 10. Let your muscles relax completely after each repetition. Repeat __________ times. Complete this exercise __________ times a day. Squats This exercise strengthens the muscles in front of your thigh and knee (quadriceps). 5. Stand in front of a table, with your feet and knees pointing straight ahead. You may rest your hands on the table for balance but not for support. 6. Slowly bend your knees and lower your hips like you are going to sit in a chair. ? Keep your weight over your heels, not over your toes. ? Keep your lower legs upright so they are parallel with the table legs. ? Do not let your hips go lower than your knees. ? Do not bend lower than told by your health care provider. ? If your hip pain increases, do not bend as low. 7. Hold the squat  position for __________ seconds. 8. Slowly push with your legs to return to standing. Do not use your hands to pull yourself to standing. Repeat __________ times. Complete this exercise __________ times a day. Hip hike 5. Stand sideways on a bottom step. Stand on your left / right leg with your other foot unsupported next to the step. You can hold on to the railing or wall for balance if needed. 6. Keep your knees straight and your torso square. Then lift your left / right hip up toward the ceiling. 7. Hold this position for __________ seconds. 8. Slowly let your left / right hip lower toward the floor, past the starting position. Your foot should get closer to the floor. Do not lean or bend your knees. Repeat __________ times. Complete this exercise __________ times a day. Single leg stand 4. Without shoes, stand near a railing or in a doorway. You may hold on to the railing or door frame as needed for balance. 5. Squeeze your left / right buttock muscles, then lift up your other foot. ? Do not let your left / right hip push out to the side. ? It is helpful to stand in front of a mirror for this exercise so you can watch your hip. 6. Hold this position for __________ seconds. Repeat __________ times. Complete this exercise __________ times a day. This information is not intended to replace advice given to you by your health care provider. Make sure you discuss any questions you have with your health care provider. Document Revised: 06/10/2018 Document Reviewed: 06/10/2018 Elsevier Patient Education  2020 [employer].

## 2019-09-13 LAB — CBC WITH DIFFERENTIAL/PLATELET
Absolute Monocytes: 512 cells/uL (ref 200–950)
Basophils Absolute: 56 cells/uL (ref 0–200)
Basophils Relative: 0.6 %
Eosinophils Absolute: 363 cells/uL (ref 15–500)
Eosinophils Relative: 3.9 %
HCT: 37 % (ref 35.0–45.0)
Hemoglobin: 12 g/dL (ref 11.7–15.5)
Lymphs Abs: 3618 cells/uL (ref 850–3900)
MCH: 27.3 pg (ref 27.0–33.0)
MCHC: 32.4 g/dL (ref 32.0–36.0)
MCV: 84.1 fL (ref 80.0–100.0)
MPV: 10.2 fL (ref 7.5–12.5)
Monocytes Relative: 5.5 %
Neutro Abs: 4752 cells/uL (ref 1500–7800)
Neutrophils Relative %: 51.1 %
Platelets: 275 10*3/uL (ref 140–400)
RBC: 4.4 10*6/uL (ref 3.80–5.10)
RDW: 13.3 % (ref 11.0–15.0)
Total Lymphocyte: 38.9 %
WBC: 9.3 10*3/uL (ref 3.8–10.8)

## 2019-09-13 LAB — COMPLETE METABOLIC PANEL WITH GFR
AG Ratio: 1.3 (calc) (ref 1.0–2.5)
ALT: 13 U/L (ref 6–29)
AST: 12 U/L (ref 10–30)
Albumin: 4.1 g/dL (ref 3.6–5.1)
Alkaline phosphatase (APISO): 71 U/L (ref 31–125)
BUN: 12 mg/dL (ref 7–25)
CO2: 22 mmol/L (ref 20–32)
Calcium: 9.4 mg/dL (ref 8.6–10.2)
Chloride: 104 mmol/L (ref 98–110)
Creat: 0.86 mg/dL (ref 0.50–1.10)
GFR, Est African American: 98 mL/min/{1.73_m2} (ref >=60)
GFR, Est Non African American: 84 mL/min/{1.73_m2} (ref >=60)
Globulin: 3.1 g/dL (calc) (ref 1.9–3.7)
Glucose, Bld: 126 mg/dL — ABNORMAL HIGH (ref 65–99)
Potassium: 4.4 mmol/L (ref 3.5–5.3)
Sodium: 138 mmol/L (ref 135–146)
Total Bilirubin: 0.5 mg/dL (ref 0.2–1.2)
Total Protein: 7.2 g/dL (ref 6.1–8.1)

## 2019-09-15 NOTE — Progress Notes (Signed)
Glucose is 126. Rest of CMP WNL.  CBC WNL.

## 2019-09-19 ENCOUNTER — Other Ambulatory Visit: Payer: Self-pay | Admitting: Rheumatology

## 2019-09-19 DIAGNOSIS — M0579 Rheumatoid arthritis with rheumatoid factor of multiple sites without organ or systems involvement: Secondary | ICD-10-CM

## 2019-11-13 ENCOUNTER — Encounter: Payer: Self-pay | Admitting: Rheumatology

## 2019-11-13 LAB — HM DIABETES EYE EXAM

## 2019-12-01 ENCOUNTER — Other Ambulatory Visit (HOSPITAL_COMMUNITY): Payer: Self-pay | Admitting: Neurology

## 2019-12-01 DIAGNOSIS — M542 Cervicalgia: Secondary | ICD-10-CM

## 2019-12-02 ENCOUNTER — Telehealth: Payer: Self-pay | Admitting: *Deleted

## 2019-12-02 NOTE — Progress Notes (Deleted)
Office Visit Note  Patient: Haley Goodman             Date of Birth: 24-Dec-1980           MRN: [id number]             PCP: Suzan Slick, MD Referring: Suzan Slick, MD Visit Date: 12/03/2019 Occupation: @GUAROCC @  Subjective:  No chief complaint on file.   History of Present Illness: Haley Goodman is a 39 y.o. female ***   Activities of Daily Living:  Patient reports morning stiffness for *** {minute/hour:19697}.   Patient {ACTIONS;DENIES/REPORTS:21021675::"Denies"} nocturnal pain.  Difficulty dressing/grooming: {ACTIONS;DENIES/REPORTS:21021675::"Denies"} Difficulty climbing stairs: {ACTIONS;DENIES/REPORTS:21021675::"Denies"} Difficulty getting out of chair: {ACTIONS;DENIES/REPORTS:21021675::"Denies"} Difficulty using hands for taps, buttons, cutlery, and/or writing: {ACTIONS;DENIES/REPORTS:21021675::"Denies"}  No Rheumatology ROS completed.   PMFS History:  Patient Active Problem List   Diagnosis Date Noted  . MDD (major depressive disorder), recurrent episode, moderate (HCC) 03/22/2017  . Encounter for therapeutic drug monitoring 01/11/2017  . Rheumatoid arthritis involving multiple sites with positive rheumatoid factor (HCC) 05/10/2016  . Fibromyalgia 05/10/2016  . Primary osteoarthritis of both hands 05/10/2016  . Chronic pain syndrome/ in pain manaement 05/10/2016  . High risk medication use/ Plaquenil 05/10/2016  . Spondylosis of lumbar region without myelopathy or radiculopathy 05/10/2016  . Abnormal serum protein electrophoresis 05/10/2016  . Vitamin D deficiency 05/10/2016  . Uncontrolled type 2 diabetes mellitus with complication, without long-term current use of insulin (HCC) 03/13/2016  . Essential hypertension, benign 03/13/2016  . Morbid obesity (HCC) 03/13/2016  . Pulmonary embolism (HCC) 06/06/2011    Class: History of    Past Medical History:  Diagnosis Date  . Anemia   . Aneurysm (HCC)   . Arthritis   . Fibromyalgia   .  Hypertension   . Lupus (HCC)   . PE (pulmonary embolism) 2009  . RA (rheumatoid arthritis) (HCC)     Family History  Problem Relation Age of Onset  . Stroke Mother   . Cancer Mother   . Cancer Other   . Diabetes Other   . Cancer Sister   . Pseudochol deficiency Neg Hx   . Malignant hyperthermia Neg Hx   . Hypotension Neg Hx   . Anesthesia problems Neg Hx    Past Surgical History:  Procedure Laterality Date  . ABLATION    . CHOLECYSTECTOMY    . DILATION AND CURETTAGE OF UTERUS    . TUBAL LIGATION     Social History   Social History Narrative  . Not on file   Immunization History  Administered Date(s) Administered  . Influenza Split 01/26/2014  . Influenza, Seasonal, Injecte, Preservative Fre 12/05/2010, 12/23/2015  . Influenza,inj,Quad PF,6+ Mos 12/05/2017  . Influenza-Unspecified 03/30/2008, 11/22/2009, 02/11/2013, 12/18/2013, 11/25/2014  . Moderna SARS-COVID-2 Vaccination 09/18/2019, 10/16/2019  . PPD Test 10/03/2017  . Pneumococcal Conjugate-13 02/27/2013  . Pneumococcal Polysaccharide-23 04/07/2011  . Tdap 09/10/2013     Objective: Vital Signs: There were no vitals taken for this visit.   Physical Exam   Musculoskeletal Exam: ***  CDAI Exam: CDAI Score: -- Patient Global: --; Provider Global: -- Swollen: --; Tender: -- Joint Exam 12/03/2019   No joint exam has been documented for this visit   There is currently no information documented on the homunculus. Go to the Rheumatology activity and complete the homunculus joint exam.  Investigation: No additional findings.  Imaging: No results found.  Recent Labs: Lab Results  Component Value Date   WBC 9.3 09/12/2019   HGB 12.0  09/12/2019   PLT 275 09/12/2019   NA 138 09/12/2019   K 4.4 09/12/2019   CL 104 09/12/2019   CO2 22 09/12/2019   GLUCOSE 126 (H) 09/12/2019   BUN 12 09/12/2019   CREATININE 0.86 09/12/2019   BILITOT 0.5 09/12/2019   ALKPHOS 96 03/12/2009   AST 12 09/12/2019   ALT 13  09/12/2019   PROT 7.2 09/12/2019   ALBUMIN 4.0 03/12/2009   CALCIUM 9.4 09/12/2019   GFRAA 98 09/12/2019    Speciality Comments: PLQ Eye Exam: 11/13/2019 WNL @ [facility] follow up in 1 year  Procedures:  No procedures performed Allergies: Codeine, Lacosamide, Penicillins, Prochlorperazine, Sulfa antibiotics, Cetirizine, Iodinated diagnostic agents, Lisinopril, Naproxen, Nitrofurantoin, Other, Pregabalin, Compazine, Gabapentin, Nitrofurantoin monohyd macro, Sulfasalazine, and Dexamethasone   Assessment / Plan:     Visit Diagnoses: No diagnosis found.  Orders: No orders of the defined types were placed in this encounter.  No orders of the defined types were placed in this encounter.   Face-to-face time spent with patient was *** minutes. Greater than 50% of time was spent in counseling and coordination of care.  Follow-Up Instructions: No follow-ups on file.   Ellen Henri, CMA  Note - This record has been created using Animal nutritionist.  Chart creation errors have been sought, but may not always  have been located. Such creation errors do not reflect on  the standard of medical care.

## 2019-12-02 NOTE — Telephone Encounter (Signed)
Patient reached out to Dr. Corliss Skains via the on call service stating she is having a flare of the scalp.  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 Patient states it started on 12/01/2019 with tender red spots on her scalp. Patient states she now has knots. Patient offered two appointments for today that she was unable to take. Patient scheduled for an evaluation on 12/03/2019 at 8:00 am.

## 2019-12-03 ENCOUNTER — Telehealth: Payer: Self-pay

## 2019-12-03 ENCOUNTER — Ambulatory Visit: Payer: Medicaid Other | Admitting: Rheumatology

## 2019-12-03 NOTE — Telephone Encounter (Signed)
I returned patient's call and discussed that she should see a dermatologist to rule out infection.  She is on immunosuppressive agents.  She was in agreement.  She will either schedule an appointment with her PCP or a dermatologist.

## 2019-12-03 NOTE — Telephone Encounter (Signed)
Patient called stating she missed her appointment this morning due to her grandfather who lives with her needing to get a COVID test.  Patient states her grandfather was diagnosed with double pneumonia.  Patient is requesting a return call to discuss having a virtual appointment.

## 2019-12-25 ENCOUNTER — Ambulatory Visit: Payer: Medicaid Other | Admitting: Family Medicine

## 2019-12-25 NOTE — Progress Notes (Deleted)
Cardiology Office Note  Date: 12/25/2019   ID: Haley Goodman, DOB [birth year], MRN [id number]  PCP:  Suzan Slick, MD  Cardiologist:  No primary care provider on file. Electrophysiologist:  None   Chief Complaint: Hospital follow-up chest pain  History of Present Illness: Haley Goodman is a 39 y.o. female with a history of DM2, GERD, HTN, lupus, PE, rheumatoid arthritis, seizure disorder, fibromyalgia, endometriosis, brain aneurysm 2019, asthma.  Presented to [facility] ER 12/05/2019 was chest pain since 2 AM the previous day.  Described as mid chest radiating around left breast the back.  Positive for shortness of breath.  Took a baby aspirin.  Chest x-ray was negative, CTA of chest was negative, EKG was normal sinus rhythm with a rate of 87, nonspecific ST and T wave abnormality.  Troponins were negative x2.  CBC unremarkable.  Sodium 134, glucose 116.  D-dimer 0.89.  Past Medical History:  Diagnosis Date  . Anemia   . Aneurysm (HCC)   . Arthritis   . Fibromyalgia   . Hypertension   . Lupus (HCC)   . PE (pulmonary embolism) 2009  . RA (rheumatoid arthritis) (HCC)     Past Surgical History:  Procedure Laterality Date  . ABLATION    . CHOLECYSTECTOMY    . DILATION AND CURETTAGE OF UTERUS    . TUBAL LIGATION      Current Outpatient Medications  Medication Sig Dispense Refill  . albuterol (VENTOLIN HFA) 108 (90 Base) MCG/ACT inhaler Inhale into the lungs.    Marland Kitchen ascorbic acid (VITAMIN C) 250 MG CHEW Chew by mouth.    Marland Kitchen aspirin 81 MG tablet Take 81 mg by mouth 2 (two) times daily.    Marland Kitchen b complex vitamins capsule Take by mouth.    . Biotin 1 MG CAPS Take by mouth.    . Blood Glucose Monitoring Suppl (GLUCOCOM BLOOD GLUCOSE MONITOR) DEVI OneTouch Verio Meter  USE TO CHECK BLOOD SUGARS BID    . Cholecalciferol (VITAMIN D3) 50000 units CAPS TAKE 1 CAPSULE BY MOUTH WEEKLY  0  . cyclobenzaprine (FLEXERIL) 10 MG tablet Take 10 mg by mouth every 8 (eight)  hours as needed.    . diazepam (VALIUM) 5 MG tablet Take 5 mg by mouth 2 (two) times daily as needed.    . docusate sodium (DOK) 100 MG capsule TAKE 1 CAPSULE BY MOUTH TWICE DAILY AS NEEDED FOR CONSTIPATION    . DULoxetine (CYMBALTA) 60 MG capsule TAKE ONE CAPSULE BY MOUTH TWICE DAILY 180 capsule 0  . ergocalciferol (VITAMIN D2) 1.25 MG (50000 UT) capsule ergocalciferol (vitamin D2) 1,250 mcg (50,000 unit) capsule  TK ONE CAPLET BY MOUTH ONCE A WEEK    . esomeprazole (NEXIUM) 40 MG capsule Take 40 mg by mouth daily.  0  . fluticasone (FLONASE) 50 MCG/ACT nasal spray 1 spray by Each Nare route daily.    . Fluticasone-Salmeterol (ADVAIR) 500-50 MCG/DOSE AEPB Inhale into the lungs.    Marland Kitchen glucose blood (ONETOUCH VERIO) test strip OneTouch Verio test strips  CHECK SUGARS THREE TIMES A DAY    . hydrALAZINE (APRESOLINE) 25 MG tablet Take 25 mg by mouth 2 (two) times daily.   0  . hydroxychloroquine (PLAQUENIL) 200 MG tablet Take 1 tablet (200 mg total) by mouth 2 (two) times daily. 180 tablet 0  . insulin detemir (LEVEMIR FLEXTOUCH) 100 UNIT/ML FlexPen Levemir FlexTouch U-100 Insulin 100 unit/mL (3 mL) subcutaneous pen    . ipratropium-albuterol (DUONEB) 0.5-2.5 (3) MG/3ML SOLN Inhale  into the lungs.    Marland Kitchen levocetirizine (XYZAL) 5 MG tablet TAKE 1 TABLET(5 MG) BY MOUTH EVERY EVENING    . lidocaine (LIDODERM) 5 % Apply to the lower back for 12 hours each 24 hour period    . Multiple Vitamins-Minerals (MULTI VITAMIN/MINERALS) TABS Take by mouth.    . oxyCODONE-acetaminophen (PERCOCET) 10-325 MG tablet Take 1 tablet by mouth every 8 (eight) hours as needed for pain.    . potassium chloride (K-DUR) 10 MEQ tablet Take 10 mEq by mouth 2 (two) times daily.  0  . promethazine (PHENERGAN) 25 MG tablet Take by mouth.    . senna (SENOKOT) 8.6 MG tablet Take by mouth.    . topiramate (TOPAMAX) 100 MG tablet Take by mouth.     No current facility-administered medications for this visit.   Allergies:  Codeine,  Lacosamide, Penicillins, Prochlorperazine, Sulfa antibiotics, Cetirizine, Iodinated diagnostic agents, Lisinopril, Naproxen, Nitrofurantoin, Other, Pregabalin, Compazine, Gabapentin, Nitrofurantoin monohyd macro, Sulfasalazine, and Dexamethasone   Social History: The patient  reports that she has never smoked. She has never used smokeless tobacco. She reports that she does not drink alcohol and does not use drugs.   Family History: The patient's family history includes Cancer in her mother, sister, and another family member; Diabetes in an other family member; Stroke in her mother.   ROS:  Please see the history of present illness. Otherwise, complete review of systems is positive for {NONE DEFAULTED:18576::"none"}.  All other systems are reviewed and negative.   Physical Exam: VS:  There were no vitals taken for this visit., BMI There is no height or weight on file to calculate BMI.  Wt Readings from Last 3 Encounters:  09/12/19 219 lb 6.4 oz (99.5 kg)  01/14/18 229 lb 9.6 oz (104.1 kg)  11/09/17 236 lb (107 kg)    General: Patient appears comfortable at rest. HEENT: Conjunctiva and lids normal, oropharynx clear with moist mucosa. Neck: Supple, no elevated JVP or carotid bruits, no thyromegaly. Lungs: Clear to auscultation, nonlabored breathing at rest. Cardiac: Regular rate and rhythm, no S3 or significant systolic murmur, no pericardial rub. Abdomen: Soft, nontender, no hepatomegaly, bowel sounds present, no guarding or rebound. Extremities: No pitting edema, distal pulses 2+. Skin: Warm and dry. Musculoskeletal: No kyphosis. Neuropsychiatric: Alert and oriented x3, affect grossly appropriate.  ECG:  {EKG/Telemetry Strips [phone]}  Recent Labwork: 09/12/2019: ALT 13; AST 12; BUN 12; Creat 0.86; Hemoglobin 12.0; Platelets 275; Potassium 4.4; Sodium 138  No results found for: CHOL, TRIG, HDL, CHOLHDL, VLDL, LDLCALC, LDLDIRECT  Other Studies Reviewed Today:  CT angio  pulmonary 12/05/2019 [facility] secondary to chest pain or shortness of breath. IMPRESSION:   1. No acute pulmonary embolus or thoracic aortic dissection identified.  2. No acute cardiopulmonary disease identified.   Assessment and Plan:  No diagnosis found.   Medication Adjustments/Labs and Tests Ordered: Current medicines are reviewed at length with the patient today.  Concerns regarding medicines are outlined above.   Disposition: Follow-up with ***  Signed, Rennis Harding, NP 12/25/2019 8:12 AM    [facility] [address] [place], [place], [place] [address] Phone: [phone]; Fax: [phone]

## 2020-01-01 ENCOUNTER — Ambulatory Visit: Payer: Medicaid Other | Admitting: Family Medicine

## 2020-01-02 ENCOUNTER — Telehealth: Payer: Self-pay

## 2020-01-02 ENCOUNTER — Other Ambulatory Visit: Payer: Self-pay | Admitting: Rheumatology

## 2020-01-02 DIAGNOSIS — Z860100 Personal history of colon polyps, unspecified: Secondary | ICD-10-CM | POA: Insufficient documentation

## 2020-01-02 DIAGNOSIS — M0579 Rheumatoid arthritis with rheumatoid factor of multiple sites without organ or systems involvement: Secondary | ICD-10-CM

## 2020-01-02 HISTORY — DX: Personal history of colonic polyps: Z86.010

## 2020-01-02 HISTORY — DX: Personal history of colon polyps, unspecified: Z86.0100

## 2020-01-02 NOTE — Telephone Encounter (Signed)
Last Visit: 09/12/2019 Next Visit: 02/12/2020 Labs: 09/12/2019 Glucose is 126. Rest of CMP WNL. CBC WNL.  Eye exam: 11/13/2019 WNL   Current Dose per office note 09/12/2019: Plaquenil 200 mg 1 tablet by mouth twice daily DX: Rheumatoid arthritis involving multiple sites with positive rheumatoid factor   Okay to refill per Dr. Corliss Skains

## 2020-01-02 NOTE — Telephone Encounter (Signed)
Patient left a voicemail requesting prescription refill of Hydroxychloroquine to be sent to [facility] at [address] in [place].

## 2020-01-02 NOTE — Telephone Encounter (Signed)
Patient advised prescription has been sent to the pharmacy.  

## 2020-01-06 ENCOUNTER — Ambulatory Visit: Payer: Medicaid Other | Admitting: Cardiology

## 2020-01-11 NOTE — Progress Notes (Signed)
Cardiology Clinic Note   Patient Name: Haley Goodman Date of Encounter: 01/12/2020  Primary Care Provider:  Suzan Slick, MD Primary Cardiologist:  No primary care provider on file.  Patient Profile    Haley Goodman 39 year old female presents the clinic today for follow-up evaluation of her essential hypertension and chest pain.  Past Medical History    Past Medical History:  Diagnosis Date   Anemia    Aneurysm (HCC)    Arthritis    Fibromyalgia    Hypertension    Lupus (HCC)    PE (pulmonary embolism) 2009   RA (rheumatoid arthritis) (HCC)    Past Surgical History:  Procedure Laterality Date   ABLATION     CHOLECYSTECTOMY     DILATION AND CURETTAGE OF UTERUS     TUBAL LIGATION      Allergies  Allergies  Allergen Reactions   Codeine Anaphylaxis and Swelling   Lacosamide Hives and Rash   Penicillins Hives and Anaphylaxis   Prochlorperazine Itching   Sulfa Antibiotics Anaphylaxis and Swelling   Cetirizine Other (See Comments)   Iodinated Diagnostic Agents Nausea Only, Other (See Comments) and Nausea And Vomiting    Other reaction(s): GI intolerance   Lisinopril Swelling    Tongue swelling   Naproxen Other (See Comments)   Nitrofurantoin Hives   Other Other (See Comments)   Pregabalin Other (See Comments)   Compazine     Not in right state of mind.    Gabapentin    Nitrofurantoin Monohyd Macro Hives and Swelling   Sulfasalazine Hives   Dexamethasone Rash    History of Present Illness    Haley Goodman is a PMH of RA, OA both hands, spondylosis, morbid obesity, diabetes, fibromyalgia, chronic pain, abnormal serum protein, vitamin D deficiency, essential hypertension, SLE, and PE.  She was last seen by Dr. Purvis Sheffield on 12/12/2016.  She was seen for an evaluation of her anticoagulation due to chronic PE.  She was on chronic anticoagulation.  Her CT angio chest 12/16 showed no evidence of PE.  She was in  normal sinus rhythm at that time with a heart rate of 74 and no ST or T wave deviation.  She denied chest pain, and shortness of breath.  She reported swelling in her chest wall when she would have lupus flares.  She denied lower extremity edema, orthopnea, and PND.  She presented to [facility] 10/21 with complaints of chest discomfort.  Troponins were low and flat, EKG showed normal sinus rhythm and chest CT showed no acute cardiopulmonary changes.  Chest pain was felt to be MSK in nature.  She presents the clinic today for follow-up evaluation states she feels well today.  She has had one episode/flare related to her SLE in the last year.  She states that she is no longer having chest discomfort.  We reviewed her labs, and cardiac tests from [facility] from October 2021.  She indicates that she is considering breast augmentation surgery.  I informed her to send cardiology a preoperative request form.  I will have her increase her physical activity as tolerated, continue her heart healthy diet, and have her follow-up in 1 year.  She is tolerating her medication well with no side effects at this time.  Today she denies chest pain, shortness of breath, lower extremity edema, fatigue, palpitations, melena, hematuria, hemoptysis, diaphoresis, weakness, presyncope, syncope, orthopnea, and PND.   Home Medications    Prior to Admission medications   Medication Sig Start Date End  Date Taking? Authorizing Provider  albuterol (VENTOLIN HFA) 108 (90 Base) MCG/ACT inhaler Inhale into the lungs. 11/05/18   [provider]  ascorbic acid (VITAMIN C) 250 MG CHEW Chew by mouth.    [provider]  aspirin 81 MG tablet Take 81 mg by mouth 2 (two) times daily.    [provider]  b complex vitamins capsule Take by mouth.    [provider]  Biotin 1 MG CAPS Take by mouth.    [provider]  Blood Glucose Monitoring Suppl (GLUCOCOM BLOOD GLUCOSE MONITOR) DEVI OneTouch Verio  Meter  USE TO CHECK BLOOD SUGARS BID    [provider]  Cholecalciferol (VITAMIN D3) 50000 units CAPS TAKE 1 CAPSULE BY MOUTH WEEKLY 04/20/16   [provider]  cyclobenzaprine (FLEXERIL) 10 MG tablet Take 10 mg by mouth every 8 (eight) hours as needed. 09/06/19   [provider]  diazepam (VALIUM) 5 MG tablet Take 5 mg by mouth 2 (two) times daily as needed. 06/13/19   [provider]  docusate sodium (DOK) 100 MG capsule TAKE 1 CAPSULE BY MOUTH TWICE DAILY AS NEEDED FOR CONSTIPATION 07/26/18   [provider]  DULoxetine (CYMBALTA) 60 MG capsule TAKE ONE CAPSULE BY MOUTH TWICE DAILY 10/08/17   Myrlene Broker, MD  ergocalciferol (VITAMIN D2) 1.25 MG (50000 UT) capsule ergocalciferol (vitamin D2) 1,250 mcg (50,000 unit) capsule  TK ONE CAPLET BY MOUTH ONCE A WEEK    [provider]  esomeprazole (NEXIUM) 40 MG capsule Take 40 mg by mouth daily. 04/20/16   [provider]  fluticasone (FLONASE) 50 MCG/ACT nasal spray 1 spray by Each Nare route daily. 11/05/18   [provider]  Fluticasone-Salmeterol (ADVAIR) 500-50 MCG/DOSE AEPB Inhale into the lungs. 08/05/19   [provider]  glucose blood (ONETOUCH VERIO) test strip OneTouch Verio test strips  CHECK SUGARS THREE TIMES A DAY    [provider]  hydrALAZINE (APRESOLINE) 25 MG tablet Take 25 mg by mouth 2 (two) times daily.  10/04/16   [provider]  hydroxychloroquine (PLAQUENIL) 200 MG tablet TAKE 1 TABLET(200 MG) BY MOUTH TWICE DAILY 01/02/20   Pollyann Savoy, MD  insulin detemir (LEVEMIR FLEXTOUCH) 100 UNIT/ML FlexPen Levemir FlexTouch U-100 Insulin 100 unit/mL (3 mL) subcutaneous pen    [provider]  ipratropium-albuterol (DUONEB) 0.5-2.5 (3) MG/3ML SOLN Inhale into the lungs. 03/27/19 03/26/20  [provider]  levocetirizine (XYZAL) 5 MG tablet TAKE 1 TABLET(5 MG) BY MOUTH EVERY EVENING 07/16/19   [provider]    lidocaine (LIDODERM) 5 % Apply to the lower back for 12 hours each 24 hour period 04/17/19   [provider]  Multiple Vitamins-Minerals (MULTI VITAMIN/MINERALS) TABS Take by mouth.    [provider]  oxyCODONE-acetaminophen (PERCOCET) 10-325 MG tablet Take 1 tablet by mouth every 8 (eight) hours as needed for pain.    [provider]  potassium chloride (K-DUR) 10 MEQ tablet Take 10 mEq by mouth 2 (two) times daily. 09/18/16   [provider]  promethazine (PHENERGAN) 25 MG tablet Take by mouth. 08/05/19   [provider]  senna (SENOKOT) 8.6 MG tablet Take by mouth.    [provider]  topiramate (TOPAMAX) 100 MG tablet Take by mouth. 05/26/19   [provider]    Family History    Family History  Problem Relation Age of Onset   Stroke Mother    Cancer Mother    Cancer Other  Diabetes Other    Cancer Sister    Pseudochol deficiency Neg Hx    Malignant hyperthermia Neg Hx    Hypotension Neg Hx    Anesthesia problems Neg Hx    She indicated that her mother is alive. She indicated that her father is alive. She indicated that all of her three sisters are alive. She indicated that her brother is alive. She indicated that the status of her neg hx is unknown. She indicated that the status of her other is unknown.  Social History    Social History   Socioeconomic History   Marital status: Married    Spouse name: Not on file   Number of children: Not on file   Years of education: Not on file   Highest education level: Not on file  Occupational History   Not on file  Tobacco Use   Smoking status: Never Smoker   Smokeless tobacco: Never Used  Vaping Use   Vaping Use: Never used  Substance and Sexual Activity   Alcohol use: No    Comment: 08-21-2016 per pt no   Drug use: No    Comment: 08-21-2016 per pt no   Sexual activity: Not on file  Other Topics Concern   Not on file  Social History  Narrative   Not on file   Social Determinants of Health   Financial Resource Strain:    Difficulty of Paying Living Expenses: Not on file  Food Insecurity:    Worried About Running Out of Food in the Last Year: Not on file   Ran Out of Food in the Last Year: Not on file  Transportation Needs:    Lack of Transportation (Medical): Not on file   Lack of Transportation (Non-Medical): Not on file  Physical Activity:    Days of Exercise per Week: Not on file   Minutes of Exercise per Session: Not on file  Stress:    Feeling of Stress : Not on file  Social Connections:    Frequency of Communication with Friends and Family: Not on file   Frequency of Social Gatherings with Friends and Family: Not on file   Attends Religious Services: Not on file   Active Member of Clubs or Organizations: Not on file   Attends Banker Meetings: Not on file   Marital Status: Not on file  Intimate Partner Violence:    Fear of Current or Ex-Partner: Not on file   Emotionally Abused: Not on file   Physically Abused: Not on file   Sexually Abused: Not on file     Review of Systems    General:  No chills, fever, night sweats or weight changes.  Cardiovascular:  No chest pain, dyspnea on exertion, edema, orthopnea, palpitations, paroxysmal nocturnal dyspnea. Dermatological: No rash, lesions/masses Respiratory: No cough, dyspnea Urologic: No hematuria, dysuria Abdominal:   No nausea, vomiting, diarrhea, bright red blood per rectum, melena, or hematemesis Neurologic:  No visual changes, wkns, changes in mental status. All other systems reviewed and are otherwise negative except as noted above.  Physical Exam    VS:  BP 130/80    Pulse 72    Ht 5\' 7"  (1.702 m)    Wt 225 lb (102.1 kg)    SpO2 99%    BMI 35.24 kg/m  , BMI Body mass index is 35.24 kg/m. GEN: Well nourished, well developed, in no acute distress. HEENT: normal. Neck: Supple, no JVD, carotid bruits, or  masses. Cardiac: RRR, no murmurs, rubs,  or gallops. No clubbing, cyanosis, edema.  Radials/DP/PT 2+ and equal bilaterally.  Respiratory:  Respirations regular and unlabored, clear to auscultation bilaterally. GI: Soft, nontender, nondistended, BS + x 4. MS: no deformity or atrophy. Skin: warm and dry, no rash. Neuro:  Strength and sensation are intact. Psych: Normal affect.  Accessory Clinical Findings    Recent Labs: 09/12/2019: ALT 13; BUN 12; Creat 0.86; Hemoglobin 12.0; Platelets 275; Potassium 4.4; Sodium 138   Recent Lipid Panel No results found for: CHOL, TRIG, HDL, CHOLHDL, VLDL, LDLCALC, LDLDIRECT  ECG personally reviewed by me today- none today.   EKG 12/12/2016 Normal sinus rhythm no ST or T wave deviation 74 bpm   Assessment & Plan   1.  Chest wall pain- No chest pain today. presented to [facility] 12/23/2019 with complaints of chest discomfort.  Was felt to be MSK related.  Troponins were low and flat.  EKG showed normal sinus rhythm.  CT angio showed no acute cardiopulmonary disease, PE or dissection. Continue to monitor  Patient reassured that her pain was not cardiac issues  Essential hypertension-BP today 130/80.  Well-controlled at home. Continue hydralazine Heart healthy low-sodium diet-salty 6 given Increase physical activity as tolerated  SLE- currently stable. Has only had one flare up in the last year.  Follows with rheumatology  History of pulmonary embolus-no recent episodes of shortness of breath or current dyspnea.  Had an isolated episode in 2008.  Reports compliance with anticoagulation. Continue to monitor.  Disposition: Follow-up with cardiology in 1 year.   Thomasene Ripple. Manika Hast NP-C    01/12/2020, 9:54 AM [facility] [address] Office [phone] Fax [phone]  Notice: This dictation was prepared with Dragon dictation along with smaller phrase technology. Any transcriptional errors that  result from this process are unintentional and may not be corrected upon review.

## 2020-01-12 ENCOUNTER — Encounter: Payer: Self-pay | Admitting: General Practice

## 2020-01-12 ENCOUNTER — Ambulatory Visit (INDEPENDENT_AMBULATORY_CARE_PROVIDER_SITE_OTHER): Payer: 59 | Admitting: General Practice

## 2020-01-12 ENCOUNTER — Other Ambulatory Visit: Payer: Self-pay

## 2020-01-12 ENCOUNTER — Encounter (INDEPENDENT_AMBULATORY_CARE_PROVIDER_SITE_OTHER): Payer: Self-pay

## 2020-01-12 VITALS — BP 130/80 | HR 72 | Ht 67.0 in | Wt 225.0 lb

## 2020-01-12 DIAGNOSIS — I10 Essential (primary) hypertension: Secondary | ICD-10-CM | POA: Diagnosis not present

## 2020-01-12 DIAGNOSIS — M329 Systemic lupus erythematosus, unspecified: Secondary | ICD-10-CM | POA: Diagnosis not present

## 2020-01-12 DIAGNOSIS — R0789 Other chest pain: Secondary | ICD-10-CM

## 2020-01-12 DIAGNOSIS — Z86711 Personal history of pulmonary embolism: Secondary | ICD-10-CM | POA: Diagnosis not present

## 2020-01-12 NOTE — Patient Instructions (Signed)
Medication Instructions:  Your physician recommends that you continue on your current medications as directed. Please refer to the Current Medication list given to you today.  *If you need a refill on your cardiac medications before your next appointment, please call your pharmacy*   Lab Work: NONE   If you have labs (blood work) drawn today and your tests are completely normal, you will receive your results only by: Marland Kitchen MyChart Message (if you have MyChart) OR . A paper copy in the mail If you have any lab test that is abnormal or we need to change your treatment, we will call you to review the results.   Testing/Procedures: NONE    Follow-Up: At [facility], you and your health needs are our priority.  As part of our continuing mission to provide you with exceptional heart care, we have created designated Provider Care Teams.  These Care Teams include your primary Cardiologist (physician) and Advanced Practice Providers (APPs -  Physician Assistants and Nurse Practitioners) who all work together to provide you with the care you need, when you need it.  We recommend signing up for the patient portal called "MyChart".  Sign up information is provided on this After Visit Summary.  MyChart is used to connect with patients for Virtual Visits (Telemedicine).  Patients are able to view lab/test results, encounter notes, upcoming appointments, etc.  Non-urgent messages can be sent to your provider as well.   To learn more about what you can do with MyChart, go to [email or web address].    Your next appointment:   1 year(s)  The format for your next appointment:   In Person  Provider:   With MD    Other Instructions  Increase activity as tolerated.   Thank you for choosing [facility]!

## 2020-01-29 NOTE — Progress Notes (Deleted)
Office Visit Note  Patient: Haley Goodman             Date of Birth: [birth year]           MRN: [id number]             PCP: Suzan Slick, MD Referring: Suzan Slick, MD Visit Date: 02/12/2020 Occupation: @GUAROCC @  Subjective:  No chief complaint on file.   History of Present Illness: Haley Goodman is a 39 y.o. female ***   Activities of Daily Living:  Patient reports morning stiffness for *** {minute/hour:19697}.   Patient {ACTIONS;DENIES/REPORTS:21021675::"Denies"} nocturnal pain.  Difficulty dressing/grooming: {ACTIONS;DENIES/REPORTS:21021675::"Denies"} Difficulty climbing stairs: {ACTIONS;DENIES/REPORTS:21021675::"Denies"} Difficulty getting out of chair: {ACTIONS;DENIES/REPORTS:21021675::"Denies"} Difficulty using hands for taps, buttons, cutlery, and/or writing: {ACTIONS;DENIES/REPORTS:21021675::"Denies"}  No Rheumatology ROS completed.   PMFS History:  Patient Active Problem List   Diagnosis Date Noted  . MDD (major depressive disorder), recurrent episode, moderate (HCC) 03/22/2017  . Encounter for therapeutic drug monitoring 01/11/2017  . Rheumatoid arthritis involving multiple sites with positive rheumatoid factor (HCC) 05/10/2016  . Fibromyalgia 05/10/2016  . Primary osteoarthritis of both hands 05/10/2016  . Chronic pain syndrome/ in pain manaement 05/10/2016  . High risk medication use/ Plaquenil 05/10/2016  . Spondylosis of lumbar region without myelopathy or radiculopathy 05/10/2016  . Abnormal serum protein electrophoresis 05/10/2016  . Vitamin D deficiency 05/10/2016  . Uncontrolled type 2 diabetes mellitus with complication, without long-term current use of insulin (HCC) 03/13/2016  . Essential hypertension, benign 03/13/2016  . Morbid obesity (HCC) 03/13/2016  . Pulmonary embolism (HCC) 06/06/2011    Class: History of    Past Medical History:  Diagnosis Date  . Anemia   . Aneurysm (HCC)   . Arthritis   . Fibromyalgia   .  Hypertension   . Lupus (HCC)   . PE (pulmonary embolism) 2009  . RA (rheumatoid arthritis) (HCC)     Family History  Problem Relation Age of Onset  . Stroke Mother   . Cancer Mother   . Cancer Other   . Diabetes Other   . Cancer Sister   . Pseudochol deficiency Neg Hx   . Malignant hyperthermia Neg Hx   . Hypotension Neg Hx   . Anesthesia problems Neg Hx    Past Surgical History:  Procedure Laterality Date  . ABLATION    . CHOLECYSTECTOMY    . DILATION AND CURETTAGE OF UTERUS    . TUBAL LIGATION     Social History   Social History Narrative  . Not on file   Immunization History  Administered Date(s) Administered  . Influenza Split 01/26/2014  . Influenza, Seasonal, Injecte, Preservative Fre 12/05/2010, 12/23/2015  . Influenza,inj,Quad PF,6+ Mos 12/05/2017  . Influenza-Unspecified 03/30/2008, 11/22/2009, 02/11/2013, 12/18/2013, 11/25/2014  . Moderna SARS-COVID-2 Vaccination 09/18/2019, 10/16/2019  . PPD Test 10/03/2017  . Pneumococcal Conjugate-13 02/27/2013  . Pneumococcal Polysaccharide-23 04/07/2011  . Tdap 09/10/2013     Objective: Vital Signs: There were no vitals taken for this visit.   Physical Exam   Musculoskeletal Exam: ***  CDAI Exam: CDAI Score: -- Patient Global: --; Provider Global: -- Swollen: --; Tender: -- Joint Exam 02/12/2020   No joint exam has been documented for this visit   There is currently no information documented on the homunculus. Go to the Rheumatology activity and complete the homunculus joint exam.  Investigation: No additional findings.  Imaging: No results found.  Recent Labs: Lab Results  Component Value Date   WBC 9.3 09/12/2019   HGB 12.0  09/12/2019   PLT 275 09/12/2019   NA 138 09/12/2019   K 4.4 09/12/2019   CL 104 09/12/2019   CO2 22 09/12/2019   GLUCOSE 126 (H) 09/12/2019   BUN 12 09/12/2019   CREATININE 0.86 09/12/2019   BILITOT 0.5 09/12/2019   ALKPHOS 96 03/12/2009   AST 12 09/12/2019   ALT 13  09/12/2019   PROT 7.2 09/12/2019   ALBUMIN 4.0 03/12/2009   CALCIUM 9.4 09/12/2019   GFRAA 98 09/12/2019    Speciality Comments: PLQ Eye Exam: 11/13/2019 WNL @ [facility] follow up in 1 year  Procedures:  No procedures performed Allergies: Codeine, Lacosamide, Penicillins, Prochlorperazine, Sulfa antibiotics, Cetirizine, Iodinated diagnostic agents, Lisinopril, Naproxen, Nitrofurantoin, Other, Pregabalin, Compazine, Gabapentin, Nitrofurantoin monohyd macro, Sulfasalazine, and Dexamethasone   Assessment / Plan:     Visit Diagnoses: No diagnosis found.  Orders: No orders of the defined types were placed in this encounter.  No orders of the defined types were placed in this encounter.   Face-to-face time spent with patient was *** minutes. Greater than 50% of time was spent in counseling and coordination of care.  Follow-Up Instructions: No follow-ups on file.   Ellen Henri, CMA  Note - This record has been created using Animal nutritionist.  Chart creation errors have been sought, but may not always  have been located. Such creation errors do not reflect on  the standard of medical care.

## 2020-02-12 ENCOUNTER — Ambulatory Visit: Payer: Medicaid Other | Admitting: Physician Assistant

## 2020-02-12 DIAGNOSIS — Z8679 Personal history of other diseases of the circulatory system: Secondary | ICD-10-CM

## 2020-02-12 DIAGNOSIS — M0579 Rheumatoid arthritis with rheumatoid factor of multiple sites without organ or systems involvement: Secondary | ICD-10-CM

## 2020-02-12 DIAGNOSIS — Z8659 Personal history of other mental and behavioral disorders: Secondary | ICD-10-CM

## 2020-02-12 DIAGNOSIS — Z79899 Other long term (current) drug therapy: Secondary | ICD-10-CM

## 2020-02-12 DIAGNOSIS — J4541 Moderate persistent asthma with (acute) exacerbation: Secondary | ICD-10-CM

## 2020-02-12 DIAGNOSIS — Z8639 Personal history of other endocrine, nutritional and metabolic disease: Secondary | ICD-10-CM

## 2020-02-12 DIAGNOSIS — M19041 Primary osteoarthritis, right hand: Secondary | ICD-10-CM

## 2020-02-12 DIAGNOSIS — M62838 Other muscle spasm: Secondary | ICD-10-CM

## 2020-02-12 DIAGNOSIS — M797 Fibromyalgia: Secondary | ICD-10-CM

## 2020-02-12 DIAGNOSIS — Z86711 Personal history of pulmonary embolism: Secondary | ICD-10-CM

## 2020-02-12 DIAGNOSIS — R7611 Nonspecific reaction to tuberculin skin test without active tuberculosis: Secondary | ICD-10-CM

## 2020-03-01 NOTE — Progress Notes (Deleted)
Office Visit Note  Patient: Haley Goodman             Date of Birth: 1980/04/13           MRN: [id number]             PCP: Suzan Slick, MD Referring: Suzan Slick, MD Visit Date: 03/15/2020 Occupation: @GUAROCC @  Subjective:  No chief complaint on file.   History of Present Illness: Haley Goodman is a 40 y.o. female ***   Activities of Daily Living:  Patient reports morning stiffness for *** {minute/hour:19697}.   Patient {ACTIONS;DENIES/REPORTS:21021675::"Denies"} nocturnal pain.  Difficulty dressing/grooming: {ACTIONS;DENIES/REPORTS:21021675::"Denies"} Difficulty climbing stairs: {ACTIONS;DENIES/REPORTS:21021675::"Denies"} Difficulty getting out of chair: {ACTIONS;DENIES/REPORTS:21021675::"Denies"} Difficulty using hands for taps, buttons, cutlery, and/or writing: {ACTIONS;DENIES/REPORTS:21021675::"Denies"}  No Rheumatology ROS completed.   PMFS History:  Patient Active Problem List   Diagnosis Date Noted  . MDD (major depressive disorder), recurrent episode, moderate (HCC) 03/22/2017  . Encounter for therapeutic drug monitoring 01/11/2017  . Rheumatoid arthritis involving multiple sites with positive rheumatoid factor (HCC) 05/10/2016  . Fibromyalgia 05/10/2016  . Primary osteoarthritis of both hands 05/10/2016  . Chronic pain syndrome/ in pain manaement 05/10/2016  . High risk medication use/ Plaquenil 05/10/2016  . Spondylosis of lumbar region without myelopathy or radiculopathy 05/10/2016  . Abnormal serum protein electrophoresis 05/10/2016  . Vitamin D deficiency 05/10/2016  . Uncontrolled type 2 diabetes mellitus with complication, without long-term current use of insulin (HCC) 03/13/2016  . Essential hypertension, benign 03/13/2016  . Morbid obesity (HCC) 03/13/2016  . Pulmonary embolism (HCC) 06/06/2011    Class: History of    Past Medical History:  Diagnosis Date  . Anemia   . Aneurysm (HCC)   . Arthritis   . Fibromyalgia   .  Hypertension   . Lupus (HCC)   . PE (pulmonary embolism) 2009  . RA (rheumatoid arthritis) (HCC)     Family History  Problem Relation Age of Onset  . Stroke Mother   . Cancer Mother   . Cancer Other   . Diabetes Other   . Cancer Sister   . Pseudochol deficiency Neg Hx   . Malignant hyperthermia Neg Hx   . Hypotension Neg Hx   . Anesthesia problems Neg Hx    Past Surgical History:  Procedure Laterality Date  . ABLATION    . CHOLECYSTECTOMY    . DILATION AND CURETTAGE OF UTERUS    . TUBAL LIGATION     Social History   Social History Narrative  . Not on file   Immunization History  Administered Date(s) Administered  . Influenza Split 01/26/2014  . Influenza, Seasonal, Injecte, Preservative Fre 12/05/2010, 12/23/2015  . Influenza,inj,Quad PF,6+ Mos 12/05/2017  . Influenza-Unspecified 03/30/2008, 11/22/2009, 02/11/2013, 12/18/2013, 11/25/2014  . Moderna Sars-Covid-2 Vaccination 09/18/2019, 10/16/2019  . PPD Test 10/03/2017  . Pneumococcal Conjugate-13 02/27/2013  . Pneumococcal Polysaccharide-23 04/07/2011  . Tdap 09/10/2013     Objective: Vital Signs: There were no vitals taken for this visit.   Physical Exam   Musculoskeletal Exam: ***  CDAI Exam: CDAI Score: -- Patient Global: --; Provider Global: -- Swollen: --; Tender: -- Joint Exam 03/15/2020   No joint exam has been documented for this visit   There is currently no information documented on the homunculus. Go to the Rheumatology activity and complete the homunculus joint exam.  Investigation: No additional findings.  Imaging: No results found.  Recent Labs: Lab Results  Component Value Date   WBC 9.3 09/12/2019   HGB 12.0  09/12/2019   PLT 275 09/12/2019   NA 138 09/12/2019   K 4.4 09/12/2019   CL 104 09/12/2019   CO2 22 09/12/2019   GLUCOSE 126 (H) 09/12/2019   BUN 12 09/12/2019   CREATININE 0.86 09/12/2019   BILITOT 0.5 09/12/2019   ALKPHOS 96 03/12/2009   AST 12 09/12/2019   ALT 13  09/12/2019   PROT 7.2 09/12/2019   ALBUMIN 4.0 03/12/2009   CALCIUM 9.4 09/12/2019   GFRAA 98 09/12/2019    Speciality Comments: PLQ Eye Exam: 11/13/2019 WNL @ [facility] follow up in 1 year  Procedures:  No procedures performed Allergies: Codeine, Lacosamide, Penicillins, Prochlorperazine, Sulfa antibiotics, Cetirizine, Iodinated diagnostic agents, Lisinopril, Naproxen, Nitrofurantoin, Other, Pregabalin, Compazine, Gabapentin, Nitrofurantoin monohyd macro, Sulfasalazine, and Dexamethasone   Assessment / Plan:     Visit Diagnoses: Rheumatoid arthritis involving multiple sites with positive rheumatoid factor (HCC)  High risk medication use  Fibromyalgia  Primary osteoarthritis of both hands  Positive PPD  History of vitamin D deficiency  Trapezius muscle spasm  History of hypertension  History of asthma  History of pulmonary embolism  History of depression  History of psychosis  Orders: No orders of the defined types were placed in this encounter.  No orders of the defined types were placed in this encounter.   Face-to-face time spent with patient was *** minutes. Greater than 50% of time was spent in counseling and coordination of care.  Follow-Up Instructions: No follow-ups on file.   Gearldine Bienenstock, PA-C  Note - This record has been created using Dragon software.  Chart creation errors have been sought, but may not always  have been located. Such creation errors do not reflect on  the standard of medical care.

## 2020-03-12 NOTE — Progress Notes (Unsigned)
Office Visit Note  Patient: Haley Goodman             Date of Birth: Dec 25, 1980           MRN: [id number]             PCP: Suzan Slick, MD Referring: Suzan Slick, MD Visit Date: 03/18/2020 Occupation: @GUAROCC @  Subjective:  Pain in both feet   History of Present Illness: Haley Goodman is a 40 y.o. female with history of seropositive rheumatoid arthritis and fibromyalgia.  She is taking plaquenil 200 mg 1 tablet by mouth twice daily.  She continues to tolerate Plaquenil without any side effects and has not missed any doses recently.  According to the patient she had a hysteroscopy in November 2021 and a breast reduction on 01/28/2020.   She states that she has healed from both surgeries without any complications.  She states that her back pain has improved significantly since having a breast reduction.  She states that her fibromyalgia has been flaring postoperatively. She has been having some increased myalgias and arthralgias which she attributes to cooler weather temperature.  She takes flexeril as needed for muscle spasms. She has also been using a heating pad with a vibrating massager for pain relief. She has also noticed increased pain in both hands and both feet.  She states at times it feels like she is having sharp shooting pains in her feet.  She has started to use diabetic cream on her feet which has been alleviating some of her discomfort.  She continues to take Percocet as needed and is followed closely by pain management.    Activities of Daily Living:  Patient reports morning stiffness for 3 hours.   Patient Reports nocturnal pain.  Difficulty dressing/grooming: Denies Difficulty climbing stairs: Denies Difficulty getting out of chair: Denies Difficulty using hands for taps, buttons, cutlery, and/or writing: Reports  Review of Systems  Constitutional: Positive for fatigue.  HENT: Positive for mouth dryness. Negative for mouth sores and nose  dryness.   Eyes: Positive for dryness. Negative for pain and visual disturbance.  Respiratory: Negative for cough, hemoptysis, shortness of breath and difficulty breathing.   Cardiovascular: Positive for hypertension. Negative for chest pain, palpitations and swelling in legs/feet.  Gastrointestinal: Positive for constipation. Negative for blood in stool and diarrhea.  Endocrine: Negative for increased urination.  Genitourinary: Negative for painful urination.  Musculoskeletal: Positive for arthralgias, joint pain, joint swelling, muscle weakness and morning stiffness. Negative for myalgias, muscle tenderness and myalgias.  Skin: Negative for color change, pallor, rash, hair loss, nodules/bumps, skin tightness, ulcers and sensitivity to sunlight.  Allergic/Immunologic: Negative for susceptible to infections.  Neurological: Positive for headaches. Negative for dizziness, numbness and weakness.  Hematological: Negative for swollen glands.  Psychiatric/Behavioral: Negative for depressed mood and sleep disturbance. The patient is not nervous/anxious.     PMFS History:  Patient Active Problem List   Diagnosis Date Noted  . MDD (major depressive disorder), recurrent episode, moderate (HCC) 03/22/2017  . Encounter for therapeutic drug monitoring 01/11/2017  . Rheumatoid arthritis involving multiple sites with positive rheumatoid factor (HCC) 05/10/2016  . Fibromyalgia 05/10/2016  . Primary osteoarthritis of both hands 05/10/2016  . Chronic pain syndrome/ in pain manaement 05/10/2016  . High risk medication use/ Plaquenil 05/10/2016  . Spondylosis of lumbar region without myelopathy or radiculopathy 05/10/2016  . Abnormal serum protein electrophoresis 05/10/2016  . Vitamin D deficiency 05/10/2016  . Uncontrolled type 2 diabetes mellitus with complication, without  long-term current use of insulin (HCC) 03/13/2016  . Essential hypertension, benign 03/13/2016  . Morbid obesity (HCC) 03/13/2016  .  Pulmonary embolism (HCC) 06/06/2011    Class: History of    Past Medical History:  Diagnosis Date  . Anemia   . Aneurysm (HCC)   . Arthritis   . Fibromyalgia   . Hypertension   . Lupus (HCC)   . PE (pulmonary embolism) 2009  . RA (rheumatoid arthritis) (HCC)     Family History  Problem Relation Age of Onset  . Stroke Mother   . Cancer Mother   . Cancer Other   . Diabetes Other   . Cancer Sister   . Pseudochol deficiency Neg Hx   . Malignant hyperthermia Neg Hx   . Hypotension Neg Hx   . Anesthesia problems Neg Hx    Past Surgical History:  Procedure Laterality Date  . ABLATION    . BREAST REDUCTION SURGERY  01/28/2020   Dr. Deforest Hoyles at [facility]  . CHOLECYSTECTOMY    . DILATION AND CURETTAGE OF UTERUS    . HYSTEROSCOPY  12/30/2019   Dr. Rosalia Hammers  . TUBAL LIGATION     Social History   Social History Narrative  . Not on file   Immunization History  Administered Date(s) Administered  . Influenza Split 01/26/2014  . Influenza, Seasonal, Injecte, Preservative Fre 12/05/2010, 12/23/2015  . Influenza,inj,Quad PF,6+ Mos 12/05/2017  . Influenza-Unspecified 03/30/2008, 11/22/2009, 02/11/2013, 12/18/2013, 11/25/2014  . Moderna Sars-Covid-2 Vaccination 09/18/2019, 10/16/2019  . PPD Test 10/03/2017  . Pneumococcal Conjugate-13 02/27/2013  . Pneumococcal Polysaccharide-23 04/07/2011  . Tdap 09/10/2013     Objective: Vital Signs: BP 110/73 (BP Location: Left Arm, Patient Position: Sitting, Cuff Size: Large)   Pulse 74   Ht 5\' 7"  (1.702 m)   Wt 217 lb 9.6 oz (98.7 kg)   BMI 34.08 kg/m    Physical Exam Vitals and nursing note reviewed.  Constitutional:      Appearance: She is well-developed and well-nourished.  HENT:     Head: Normocephalic and atraumatic.  Eyes:     Extraocular Movements: EOM normal.     Conjunctiva/sclera: Conjunctivae normal.  Cardiovascular:     Pulses: Intact distal pulses.  Pulmonary:     Effort: Pulmonary effort is  normal.  Abdominal:     Palpations: Abdomen is soft.  Musculoskeletal:     Cervical back: Normal range of motion.  Skin:    General: Skin is warm and dry.     Capillary Refill: Capillary refill takes less than 2 seconds.  Neurological:     Mental Status: She is alert and oriented to person, place, and time.  Psychiatric:        Mood and Affect: Mood and affect normal.        Behavior: Behavior normal.      Musculoskeletal Exam: Generalized hyperalgesia and positive tender points on exam.  Trapezius muscle tension and tenderness bilaterally. C-spine, thoracic spine, and lumbar spine good ROM.  Shoulder joints, elbow joints, wrist joints, MCPs, PIPs, and DIPs good ROM with no synovitis.  Mild PIP and DIP thickening consistent with osteoarthritis of both hands.  Knee joints good ROM with no warmth or effusion.  Ankle joints good ROM with no tenderness or inflammation.  Tenderness of MTP joints, dorsal aspect of both feet, and along the plantar fascia bilaterally.   CDAI Exam: CDAI Score: 1  Patient Global: 7 mm; Provider Global: 3 mm Swollen: 0 ; Tender: 0  Joint Exam 03/18/2020   No joint exam has been documented for this visit   There is currently no information documented on the homunculus. Go to the Rheumatology activity and complete the homunculus joint exam.  Investigation: No additional findings.  Imaging: No results found.  Recent Labs: Lab Results  Component Value Date   WBC 9.3 09/12/2019   HGB 12.0 09/12/2019   PLT 275 09/12/2019   NA 138 09/12/2019   K 4.4 09/12/2019   CL 104 09/12/2019   CO2 22 09/12/2019   GLUCOSE 126 (H) 09/12/2019   BUN 12 09/12/2019   CREATININE 0.86 09/12/2019   BILITOT 0.5 09/12/2019   ALKPHOS 96 03/12/2009   AST 12 09/12/2019   ALT 13 09/12/2019   PROT 7.2 09/12/2019   ALBUMIN 4.0 03/12/2009   CALCIUM 9.4 09/12/2019   GFRAA 98 09/12/2019    Speciality Comments: PLQ Eye Exam: 11/13/2019 WNL @ [facility] follow up  in 1 year  Procedures:  No procedures performed Allergies: Codeine, Lacosamide, Penicillins, Prochlorperazine, Sulfa antibiotics, Cetirizine, Iodinated diagnostic agents, Lisinopril, Naproxen, Nitrofurantoin, Other, Pregabalin, Compazine, Gabapentin, Nitrofurantoin monohyd macro, Sulfasalazine, and Dexamethasone   Assessment / Plan:     Visit Diagnoses: Rheumatoid arthritis involving multiple sites with positive rheumatoid factor (HCC): She has no synovitis on exam.  She has not had any recent rheumatoid arthritis flares.  She is clinically doing well on Plaquenil 200 mg 1 tablet by mouth twice daily.  She is tolerating Plaquenil without any side effects and has not missed any doses recently.  She has been experiencing increased pain and stiffness in both hands and both feet over the past several months.  She has been using diabetic foot cream on her feet which has alleviated some of her discomfort.  She has tenderness over the MTP joints but no synovitis was noted.  We discussed scheduling ultrasound of both feet to assess for synovitis but she declined at this time. She has generalized hyperalgesia on exam and has been experiencing increased myalgias and fatigue secondary to fibromyalgia.  She attributes her increased generalized pain to fibro flares due to cooler weather temperatures and recent surgery. She will continue to follow up with pain management. She will continue taking Plaquenil as prescribed.  She was advised to notify us if her joint pain persists or worsens.  She will follow-up in the office in 3 months to reassess.  High risk medication use - Plaquenil 200 mg 1 tablet by mouth twice daily. CBC and CMP drawn on 09/12/19.  She is due to update lab work.  Orders for CBC and CMP were released.  PLQ Eye Exam: 11/13/2019 WNL @ [facility] follow up in 1 year.   - Plan: CBC with Differential/Platelet, COMPLETE METABOLIC PANEL WITH GFR She has not had any recent infections. She has  received 2 moderna covid-19 vaccines.   Fibromyalgia: She has generalized hyperalgesia and positive tender points on exam.  Patient underwent a hysteroscopy in November 2021 and a breast reduction on 01/28/2020 and has been experiencing recurrent fibromyalgia flares since then.  Her neck and back pain have improved significantly since undergoing a breast reduction.  She has some trapezius muscle tension and muscle tenderness but overall her symptoms have improved significantly.  She has noticed increased fatigue as well as generalized myalgias.  She continues to follow-up with pain management and has been taking Percocet as needed and Flexeril 10 mg every 8 hours as needed for muscle spasms.  We discussed the importance  of regular exercise and good sleep hygiene.  Primary osteoarthritis of both hands: She has mild PIP and DIP thickening consistent with osteoarthritis of both hands.  She has no tenderness or inflammation on exam.  She is able to make a complete fist bilaterally.  Discussed the importance of joint protection and muscle strengthening.    Positive PPD  History of vitamin D deficiency: She is taking vitamin D 50,000 units by mouth once weekly.   Trapezius muscle spasm -She has trapezius muscle tension and tenderness today.  She has not had any trigger point since her breast reduction on 01/28/20.  She takes Flexeril 10 mg 1 tablet by mouth every 8 hours as needed for muscle spasms.  Other medical conditions are listed as follows:   History of pulmonary embolism  History of hypertension  History of depression  Moderate persistent asthma with acute exacerbation  History of psychosis  Orders: Orders Placed This Encounter  Procedures  . CBC with Differential/Platelet  . COMPLETE METABOLIC PANEL WITH GFR   No orders of the defined types were placed in this encounter.   Follow-Up Instructions: Return in about 3 months (around 06/16/2020) for Rheumatoid arthritis,  Fibromyalgia.   Gearldine Bienenstock, PA-C  Note - This record has been created using Dragon software.  Chart creation errors have been sought, but may not always  have been located. Such creation errors do not reflect on  the standard of medical care.

## 2020-03-15 ENCOUNTER — Ambulatory Visit: Payer: Medicaid Other | Admitting: Physician Assistant

## 2020-03-15 DIAGNOSIS — Z8659 Personal history of other mental and behavioral disorders: Secondary | ICD-10-CM

## 2020-03-15 DIAGNOSIS — M19041 Primary osteoarthritis, right hand: Secondary | ICD-10-CM

## 2020-03-15 DIAGNOSIS — R7611 Nonspecific reaction to tuberculin skin test without active tuberculosis: Secondary | ICD-10-CM

## 2020-03-15 DIAGNOSIS — Z8639 Personal history of other endocrine, nutritional and metabolic disease: Secondary | ICD-10-CM

## 2020-03-15 DIAGNOSIS — M0579 Rheumatoid arthritis with rheumatoid factor of multiple sites without organ or systems involvement: Secondary | ICD-10-CM

## 2020-03-15 DIAGNOSIS — M62838 Other muscle spasm: Secondary | ICD-10-CM

## 2020-03-15 DIAGNOSIS — M797 Fibromyalgia: Secondary | ICD-10-CM

## 2020-03-15 DIAGNOSIS — Z8709 Personal history of other diseases of the respiratory system: Secondary | ICD-10-CM

## 2020-03-15 DIAGNOSIS — Z86711 Personal history of pulmonary embolism: Secondary | ICD-10-CM

## 2020-03-15 DIAGNOSIS — Z79899 Other long term (current) drug therapy: Secondary | ICD-10-CM

## 2020-03-15 DIAGNOSIS — Z8679 Personal history of other diseases of the circulatory system: Secondary | ICD-10-CM

## 2020-03-17 ENCOUNTER — Other Ambulatory Visit: Payer: Self-pay | Admitting: Rheumatology

## 2020-03-17 ENCOUNTER — Encounter: Payer: Self-pay | Admitting: Counselor

## 2020-03-17 DIAGNOSIS — M0579 Rheumatoid arthritis with rheumatoid factor of multiple sites without organ or systems involvement: Secondary | ICD-10-CM

## 2020-03-17 NOTE — Telephone Encounter (Signed)
Patient will need to update lab work tomorrow at her appointment.  Ok to refill 30 day supply of PLQ.

## 2020-03-17 NOTE — Telephone Encounter (Signed)
Last Visit: 09/12/2019 Next Visit: 03/18/2020 Labs: 09/12/2019 Glucose is 126. Rest of CMP WNL. CBC WNL.  Eye exam: 11/13/2019 WNL  Current Dose per office note 09/12/2019: Plaquenil 200 mg 1 tablet by mouth twice daily. DX: Rheumatoid arthritis involving multiple sites with positive rheumatoid factor   Patient to update labs at her appointment on 03/18/2020  Okay to refill Plaquenil?

## 2020-03-18 ENCOUNTER — Encounter: Payer: Self-pay | Admitting: Physician Assistant

## 2020-03-18 ENCOUNTER — Other Ambulatory Visit: Payer: Self-pay

## 2020-03-18 ENCOUNTER — Telehealth: Payer: Self-pay | Admitting: Rheumatology

## 2020-03-18 ENCOUNTER — Ambulatory Visit (INDEPENDENT_AMBULATORY_CARE_PROVIDER_SITE_OTHER): Payer: 59 | Admitting: Physician Assistant

## 2020-03-18 VITALS — BP 110/73 | HR 74 | Ht 67.0 in | Wt 217.6 lb

## 2020-03-18 DIAGNOSIS — Z79899 Other long term (current) drug therapy: Secondary | ICD-10-CM | POA: Diagnosis not present

## 2020-03-18 DIAGNOSIS — M797 Fibromyalgia: Secondary | ICD-10-CM

## 2020-03-18 DIAGNOSIS — M19041 Primary osteoarthritis, right hand: Secondary | ICD-10-CM | POA: Diagnosis not present

## 2020-03-18 DIAGNOSIS — R7611 Nonspecific reaction to tuberculin skin test without active tuberculosis: Secondary | ICD-10-CM

## 2020-03-18 DIAGNOSIS — Z8659 Personal history of other mental and behavioral disorders: Secondary | ICD-10-CM

## 2020-03-18 DIAGNOSIS — Z8639 Personal history of other endocrine, nutritional and metabolic disease: Secondary | ICD-10-CM

## 2020-03-18 DIAGNOSIS — Z86711 Personal history of pulmonary embolism: Secondary | ICD-10-CM

## 2020-03-18 DIAGNOSIS — Z8679 Personal history of other diseases of the circulatory system: Secondary | ICD-10-CM

## 2020-03-18 DIAGNOSIS — J4541 Moderate persistent asthma with (acute) exacerbation: Secondary | ICD-10-CM

## 2020-03-18 DIAGNOSIS — M19042 Primary osteoarthritis, left hand: Secondary | ICD-10-CM

## 2020-03-18 DIAGNOSIS — M0579 Rheumatoid arthritis with rheumatoid factor of multiple sites without organ or systems involvement: Secondary | ICD-10-CM | POA: Diagnosis not present

## 2020-03-18 DIAGNOSIS — M62838 Other muscle spasm: Secondary | ICD-10-CM

## 2020-03-18 NOTE — Telephone Encounter (Signed)
Patient requesting when labs result, that a copy be faxed to her Pain Management doctor. Dr. Lowell Guitar at fax # [phone]. Phone # [phone]

## 2020-03-19 LAB — CBC WITH DIFFERENTIAL/PLATELET
Absolute Monocytes: 409 cells/uL (ref 200–950)
Basophils Absolute: 37 cells/uL (ref 0–200)
Basophils Relative: 0.5 %
Eosinophils Absolute: 307 cells/uL (ref 15–500)
Eosinophils Relative: 4.2 %
HCT: 35.8 % (ref 35.0–45.0)
Hemoglobin: 11.6 g/dL — ABNORMAL LOW (ref 11.7–15.5)
Lymphs Abs: 2738 cells/uL (ref 850–3900)
MCH: 26.9 pg — ABNORMAL LOW (ref 27.0–33.0)
MCHC: 32.4 g/dL (ref 32.0–36.0)
MCV: 82.9 fL (ref 80.0–100.0)
MPV: 10.5 fL (ref 7.5–12.5)
Monocytes Relative: 5.6 %
Neutro Abs: 3811 cells/uL (ref 1500–7800)
Neutrophils Relative %: 52.2 %
Platelets: 298 10*3/uL (ref 140–400)
RBC: 4.32 10*6/uL (ref 3.80–5.10)
RDW: 13.4 % (ref 11.0–15.0)
Total Lymphocyte: 37.5 %
WBC: 7.3 10*3/uL (ref 3.8–10.8)

## 2020-03-19 LAB — COMPLETE METABOLIC PANEL WITH GFR
AG Ratio: 1.4 (calc) (ref 1.0–2.5)
ALT: 11 U/L (ref 6–29)
AST: 13 U/L (ref 10–30)
Albumin: 4 g/dL (ref 3.6–5.1)
Alkaline phosphatase (APISO): 67 U/L (ref 31–125)
BUN: 11 mg/dL (ref 7–25)
CO2: 24 mmol/L (ref 20–32)
Calcium: 8.9 mg/dL (ref 8.6–10.2)
Chloride: 108 mmol/L (ref 98–110)
Creat: 0.8 mg/dL (ref 0.50–1.10)
GFR, Est African American: 106 mL/min/{1.73_m2} (ref >=60)
GFR, Est Non African American: 92 mL/min/{1.73_m2} (ref >=60)
Globulin: 2.9 g/dL (calc) (ref 1.9–3.7)
Glucose, Bld: 116 mg/dL — ABNORMAL HIGH (ref 65–99)
Potassium: 4.2 mmol/L (ref 3.5–5.3)
Sodium: 138 mmol/L (ref 135–146)
Total Bilirubin: 0.3 mg/dL (ref 0.2–1.2)
Total Protein: 6.9 g/dL (ref 6.1–8.1)

## 2020-03-19 NOTE — Telephone Encounter (Signed)
Labs faxed to Dr. Lowell Guitar as requested.

## 2020-03-19 NOTE — Progress Notes (Signed)
Glucose is elevated-116. Rest of CMP WNL.  Hemoglobin and MCH are borderline low but stable. Rest of CBC WNL.  We will continue to monitor.  No change in therapy recommended at this time.

## 2020-04-16 DIAGNOSIS — M1990 Unspecified osteoarthritis, unspecified site: Secondary | ICD-10-CM | POA: Insufficient documentation

## 2020-04-16 HISTORY — DX: Unspecified osteoarthritis, unspecified site: M19.90

## 2020-04-29 ENCOUNTER — Encounter: Payer: Medicaid Other | Admitting: Counselor

## 2020-05-07 ENCOUNTER — Encounter: Payer: Medicaid Other | Admitting: Counselor

## 2020-06-15 ENCOUNTER — Encounter: Payer: Medicaid Other | Admitting: Counselor

## 2020-06-21 ENCOUNTER — Ambulatory Visit: Payer: Self-pay

## 2020-06-21 ENCOUNTER — Encounter: Payer: Self-pay | Admitting: Rheumatology

## 2020-06-21 ENCOUNTER — Other Ambulatory Visit: Payer: Self-pay

## 2020-06-21 ENCOUNTER — Ambulatory Visit (INDEPENDENT_AMBULATORY_CARE_PROVIDER_SITE_OTHER): Payer: 59 | Admitting: Rheumatology

## 2020-06-21 VITALS — BP 126/85 | HR 69 | Resp 16 | Ht 67.0 in | Wt 215.0 lb

## 2020-06-21 DIAGNOSIS — J4541 Moderate persistent asthma with (acute) exacerbation: Secondary | ICD-10-CM

## 2020-06-21 DIAGNOSIS — Z8639 Personal history of other endocrine, nutritional and metabolic disease: Secondary | ICD-10-CM

## 2020-06-21 DIAGNOSIS — M25512 Pain in left shoulder: Secondary | ICD-10-CM

## 2020-06-21 DIAGNOSIS — M0579 Rheumatoid arthritis with rheumatoid factor of multiple sites without organ or systems involvement: Secondary | ICD-10-CM | POA: Diagnosis not present

## 2020-06-21 DIAGNOSIS — G8929 Other chronic pain: Secondary | ICD-10-CM

## 2020-06-21 DIAGNOSIS — M62838 Other muscle spasm: Secondary | ICD-10-CM

## 2020-06-21 DIAGNOSIS — R7611 Nonspecific reaction to tuberculin skin test without active tuberculosis: Secondary | ICD-10-CM

## 2020-06-21 DIAGNOSIS — M19042 Primary osteoarthritis, left hand: Secondary | ICD-10-CM

## 2020-06-21 DIAGNOSIS — Z79899 Other long term (current) drug therapy: Secondary | ICD-10-CM

## 2020-06-21 DIAGNOSIS — Z86711 Personal history of pulmonary embolism: Secondary | ICD-10-CM

## 2020-06-21 DIAGNOSIS — Z8679 Personal history of other diseases of the circulatory system: Secondary | ICD-10-CM

## 2020-06-21 DIAGNOSIS — Z8659 Personal history of other mental and behavioral disorders: Secondary | ICD-10-CM

## 2020-06-21 DIAGNOSIS — M19041 Primary osteoarthritis, right hand: Secondary | ICD-10-CM | POA: Diagnosis not present

## 2020-06-21 DIAGNOSIS — M797 Fibromyalgia: Secondary | ICD-10-CM

## 2020-06-21 NOTE — Patient Instructions (Signed)
Shoulder Exercises Ask your health care provider which exercises are safe for you. Do exercises exactly as told by your health care provider and adjust them as directed. It is normal to feel mild stretching, pulling, tightness, or discomfort as you do these exercises. Stop right away if you feel sudden pain or your pain gets worse. Do not begin these exercises until told by your health care provider. Stretching exercises External rotation and abduction This exercise is sometimes called corner stretch. This exercise rotates your arm outward (external rotation) and moves your arm out from your body (abduction). 1. Stand in a doorway with one of your feet slightly in front of the other. This is called a staggered stance. If you cannot reach your forearms to the door frame, stand facing a corner of a room. 2. Choose one of the following positions as told by your health care provider: ? Place your hands and forearms on the door frame above your head. ? Place your hands and forearms on the door frame at the height of your head. ? Place your hands on the door frame at the height of your elbows. 3. Slowly move your weight onto your front foot until you feel a stretch across your chest and in the front of your shoulders. Keep your head and chest upright and keep your abdominal muscles tight. 4. Hold for __________ seconds. 5. To release the stretch, shift your weight to your back foot. Repeat __________ times. Complete this exercise __________ times a day.   Extension, standing 1. Stand and hold a broomstick, a cane, or a similar object behind your back. ? Your hands should be a little wider than shoulder width apart. ? Your palms should face away from your back. 2. Keeping your elbows straight and your shoulder muscles relaxed, move the stick away from your body until you feel a stretch in your shoulders (extension). ? Avoid shrugging your shoulders while you move the stick. Keep your shoulder blades  tucked down toward the middle of your back. 3. Hold for __________ seconds. 4. Slowly return to the starting position. Repeat __________ times. Complete this exercise __________ times a day. Range-of-motion exercises Pendulum 1. Stand near a wall or a surface that you can hold onto for balance. 2. Bend at the waist and let your left / right arm hang straight down. Use your other arm to support you. Keep your back straight and do not lock your knees. 3. Relax your left / right arm and shoulder muscles, and move your hips and your trunk so your left / right arm swings freely. Your arm should swing because of the motion of your body, not because you are using your arm or shoulder muscles. 4. Keep moving your hips and trunk so your arm swings in the following directions, as told by your health care provider: ? Side to side. ? Forward and backward. ? In clockwise and counterclockwise circles. 5. Continue each motion for __________ seconds, or for as long as told by your health care provider. 6. Slowly return to the starting position. Repeat __________ times. Complete this exercise __________ times a day.   Shoulder flexion, standing 1. Stand and hold a broomstick, a cane, or a similar object. Place your hands a little more than shoulder width apart on the object. Your left / right hand should be palm up, and your other hand should be palm down. 2. Keep your elbow straight and your shoulder muscles relaxed. Push the stick up with your healthy   arm to raise your left / right arm in front of your body, and then over your head until you feel a stretch in your shoulder (flexion). ? Avoid shrugging your shoulder while you raise your arm. Keep your shoulder blade tucked down toward the middle of your back. 3. Hold for __________ seconds. 4. Slowly return to the starting position. Repeat __________ times. Complete this exercise __________ times a day.   Shoulder abduction, standing 1. Stand and hold a  broomstick, a cane, or a similar object. Place your hands a little more than shoulder width apart on the object. Your left / right hand should be palm up, and your other hand should be palm down. 2. Keep your elbow straight and your shoulder muscles relaxed. Push the object across your body toward your left / right side. Raise your left / right arm to the side of your body (abduction) until you feel a stretch in your shoulder. ? Do not raise your arm above shoulder height unless your health care provider tells you to do that. ? If directed, raise your arm over your head. ? Avoid shrugging your shoulder while you raise your arm. Keep your shoulder blade tucked down toward the middle of your back. 3. Hold for __________ seconds. 4. Slowly return to the starting position. Repeat __________ times. Complete this exercise __________ times a day. Internal rotation 1. Place your left / right hand behind your back, palm up. 2. Use your other hand to dangle an exercise band, a towel, or a similar object over your shoulder. Grasp the band with your left / right hand so you are holding on to both ends. 3. Gently pull up on the band until you feel a stretch in the front of your left / right shoulder. The movement of your arm toward the center of your body is called internal rotation. ? Avoid shrugging your shoulder while you raise your arm. Keep your shoulder blade tucked down toward the middle of your back. 4. Hold for __________ seconds. 5. Release the stretch by letting go of the band and lowering your hands. Repeat __________ times. Complete this exercise __________ times a day.   Strengthening exercises External rotation 1. Sit in a stable chair without armrests. 2. Secure an exercise band to a stable object at elbow height on your left / right side. 3. Place a soft object, such as a folded towel or a small pillow, between your left / right upper arm and your body to move your elbow about 4 inches (10  cm) away from your side. 4. Hold the end of the exercise band so it is tight and there is no slack. 5. Keeping your elbow pressed against the soft object, slowly move your forearm out, away from your abdomen (external rotation). Keep your body steady so only your forearm moves. 6. Hold for __________ seconds. 7. Slowly return to the starting position. Repeat __________ times. Complete this exercise __________ times a day.   Shoulder abduction 1. Sit in a stable chair without armrests, or stand up. 2. Hold a __________ weight in your left / right hand, or hold an exercise band with both hands. 3. Start with your arms straight down and your left / right palm facing in, toward your body. 4. Slowly lift your left / right hand out to your side (abduction). Do not lift your hand above shoulder height unless your health care provider tells you that this is safe. ? Keep your arms straight. ? Avoid   shrugging your shoulder while you do this movement. Keep your shoulder blade tucked down toward the middle of your back. 5. Hold for __________ seconds. 6. Slowly lower your arm, and return to the starting position. Repeat __________ times. Complete this exercise __________ times a day.   Shoulder extension 1. Sit in a stable chair without armrests, or stand up. 2. Secure an exercise band to a stable object in front of you so it is at shoulder height. 3. Hold one end of the exercise band in each hand. Your palms should face each other. 4. Straighten your elbows and lift your hands up to shoulder height. 5. Step back, away from the secured end of the exercise band, until the band is tight and there is no slack. 6. Squeeze your shoulder blades together as you pull your hands down to the sides of your thighs (extension). Stop when your hands are straight down by your sides. Do not let your hands go behind your body. 7. Hold for __________ seconds. 8. Slowly return to the starting position. Repeat __________  times. Complete this exercise __________ times a day. Shoulder row 1. Sit in a stable chair without armrests, or stand up. 2. Secure an exercise band to a stable object in front of you so it is at waist height. 3. Hold one end of the exercise band in each hand. Position your palms so that your thumbs are facing the ceiling (neutral position). 4. Bend each of your elbows to a 90-degree angle (right angle) and keep your upper arms at your sides. 5. Step back until the band is tight and there is no slack. 6. Slowly pull your elbows back behind you. 7. Hold for __________ seconds. 8. Slowly return to the starting position. Repeat __________ times. Complete this exercise __________ times a day. Shoulder press-ups 1. Sit in a stable chair that has armrests. Sit upright, with your feet flat on the floor. 2. Put your hands on the armrests so your elbows are bent and your fingers are pointing forward. Your hands should be about even with the sides of your body. 3. Push down on the armrests and use your arms to lift yourself off the chair. Straighten your elbows and lift yourself up as much as you comfortably can. ? Move your shoulder blades down, and avoid letting your shoulders move up toward your ears. ? Keep your feet on the ground. As you get stronger, your feet should support less of your body weight as you lift yourself up. 4. Hold for __________ seconds. 5. Slowly lower yourself back into the chair. Repeat __________ times. Complete this exercise __________ times a day.   Wall push-ups 1. Stand so you are facing a stable wall. Your feet should be about one arm-length away from the wall. 2. Lean forward and place your palms on the wall at shoulder height. 3. Keep your feet flat on the floor as you bend your elbows and lean forward toward the wall. 4. Hold for __________ seconds. 5. Straighten your elbows to push yourself back to the starting position. Repeat __________ times. Complete this  exercise __________ times a day.   This information is not intended to replace advice given to you by your health care provider. Make sure you discuss any questions you have with your health care provider. Document Revised: 06/07/2018 Document Reviewed: 03/15/2018 [employer]vier Patient Education  2021 [employer]  

## 2020-06-21 NOTE — Progress Notes (Signed)
Office Visit Note  Patient: Haley Goodman             Date of Birth: November 13, 1980           MRN: [id number]             PCP: Suzan Slick, MD Referring: Suzan Slick, MD Visit Date: 06/21/2020 Occupation: @  Subjective:  Other (Bilateral foot pain and bilateral arm pain/locking )   History of Present Illness: Dontasia Goodman is a 40 y.o. female with history of rheumatoid arthritis, osteoarthritis and fibromyalgia syndrome.  She states she has been having increased pain and discomfort in her bilateral shoulders and upper arms.  She continues to have some discomfort in her hands.  She denies any joint swelling.  She continues to have some generalized pain and discomfort from fibromyalgia.  Activities of Daily Living:  Patient reports morning stiffness for several  minutes.   Patient Denies nocturnal pain.  Difficulty dressing/grooming: Reports Difficulty climbing stairs: Denies Difficulty getting out of chair: Denies Difficulty using hands for taps, buttons, cutlery, and/or writing: Reports  Review of Systems  Constitutional: Positive for fatigue. Negative for night sweats, weight gain and weight loss.  HENT: Positive for mouth dryness. Negative for mouth sores, trouble swallowing, trouble swallowing and nose dryness.   Eyes: Positive for dryness. Negative for pain, redness, itching and visual disturbance.  Respiratory: Negative for cough, shortness of breath and difficulty breathing.   Cardiovascular: Negative for chest pain, palpitations, hypertension, irregular heartbeat and swelling in legs/feet.  Gastrointestinal: Negative for blood in stool, constipation and diarrhea.  Endocrine: Negative for increased urination.  Genitourinary: Negative for difficulty urinating and vaginal dryness.  Musculoskeletal: Positive for arthralgias, joint pain, myalgias, morning stiffness, muscle tenderness and myalgias. Negative for joint swelling and muscle weakness.   Skin: Positive for rash. Negative for color change, hair loss, redness, skin tightness, ulcers and sensitivity to sunlight.  Allergic/Immunologic: Positive for susceptible to infections.  Neurological: Positive for numbness, headaches, parasthesias, memory loss and weakness. Negative for dizziness and night sweats.  Hematological: Positive for bruising/bleeding tendency. Negative for swollen glands.  Psychiatric/Behavioral: Negative for depressed mood, confusion and sleep disturbance. The patient is not nervous/anxious.     PMFS History:  Patient Active Problem List   Diagnosis Date Noted  . MDD (major depressive disorder), recurrent episode, moderate (HCC) 03/22/2017  . Encounter for therapeutic drug monitoring 01/11/2017  . Rheumatoid arthritis involving multiple sites with positive rheumatoid factor (HCC) 05/10/2016  . Fibromyalgia 05/10/2016  . Primary osteoarthritis of both hands 05/10/2016  . Chronic pain syndrome/ in pain manaement 05/10/2016  . High risk medication use/ Plaquenil 05/10/2016  . Spondylosis of lumbar region without myelopathy or radiculopathy 05/10/2016  . Abnormal serum protein electrophoresis 05/10/2016  . Vitamin D deficiency 05/10/2016  . Uncontrolled type 2 diabetes mellitus with complication, without long-term current use of insulin (HCC) 03/13/2016  . Essential hypertension, benign 03/13/2016  . Morbid obesity (HCC) 03/13/2016  . Pulmonary embolism (HCC) 06/06/2011    Class: History of    Past Medical History:  Diagnosis Date  . Anemia   . Aneurysm (HCC)   . Arthritis   . Fibromyalgia   . Hypertension   . Lupus (HCC)   . PE (pulmonary embolism) 2009  . RA (rheumatoid arthritis) (HCC)     Family History  Problem Relation Age of Onset  . Stroke Mother   . Cancer Mother   . Cancer Other   . Diabetes Other   .  Cancer Sister   . Pseudochol deficiency Neg Hx   . Malignant hyperthermia Neg Hx   . Hypotension Neg Hx   . Anesthesia problems Neg Hx     Past Surgical History:  Procedure Laterality Date  . ABLATION    . BREAST REDUCTION SURGERY  01/28/2020   Dr. Deforest Hoyles at [facility]  . CHOLECYSTECTOMY    . DILATION AND CURETTAGE OF UTERUS    . HYSTEROSCOPY  12/30/2019   Dr. Rosalia Hammers  . TUBAL LIGATION     Social History   Social History Narrative  . Not on file   Immunization History  Administered Date(s) Administered  . Influenza Split 01/26/2014  . Influenza, Seasonal, Injecte, Preservative Fre 12/05/2010, 12/23/2015  . Influenza,inj,Quad PF,6+ Mos 12/05/2017  . Influenza-Unspecified 03/30/2008, 11/22/2009, 02/11/2013, 12/18/2013, 11/25/2014  . Moderna Sars-Covid-2 Vaccination 09/18/2019, 10/16/2019, 03/18/2020  . PPD Test 10/03/2017  . Pneumococcal Conjugate-13 02/27/2013  . Pneumococcal Polysaccharide-23 04/07/2011  . Tdap 09/10/2013     Objective: Vital Signs: BP 126/85 (BP Location: Left Arm, Patient Position: Sitting, Cuff Size: Large)   Pulse 69   Resp 16   Ht 5\' 7"  (1.702 m)   Wt 215 lb (97.5 kg)   BMI 33.67 kg/m    Physical Exam Vitals and nursing note reviewed.  Constitutional:      Appearance: She is well-developed.  HENT:     Head: Normocephalic and atraumatic.  Eyes:     Conjunctiva/sclera: Conjunctivae normal.  Cardiovascular:     Rate and Rhythm: Normal rate and regular rhythm.     Heart sounds: Normal heart sounds.  Pulmonary:     Effort: Pulmonary effort is normal.     Breath sounds: Normal breath sounds.  Abdominal:     General: Bowel sounds are normal.     Palpations: Abdomen is soft.  Musculoskeletal:     Cervical back: Normal range of motion.  Lymphadenopathy:     Cervical: No cervical adenopathy.  Skin:    General: Skin is warm and dry.     Capillary Refill: Capillary refill takes less than 2 seconds.  Neurological:     Mental Status: She is alert and oriented to person, place, and time.  Psychiatric:        Behavior: Behavior normal.      Musculoskeletal  Exam: C-spine was in good range of motion.  She had painful limited range of motion of her left shoulder joint with abduction about 70 degrees.  Right shoulder joint was in good range of motion.  Elbow joints, wrist joints, MCPs PIPs and DIPs with good range of motion with no synovitis.  Hip joints, knee joints, ankles, MTPs and PIPs with good range of motion with no synovitis.  CDAI Exam: CDAI Score: 1.6  Patient Global: 4 mm; Provider Global: 2 mm Swollen: 0 ; Tender: 1  Joint Exam 06/21/2020      Right  Left  Glenohumeral      Tender     Investigation: No additional findings.  Imaging: No results found.  Recent Labs: Lab Results  Component Value Date   WBC 7.3 03/18/2020   HGB 11.6 (L) 03/18/2020   PLT 298 03/18/2020   NA 138 03/18/2020   K 4.2 03/18/2020   CL 108 03/18/2020   CO2 24 03/18/2020   GLUCOSE 116 (H) 03/18/2020   BUN 11 03/18/2020   CREATININE 0.80 03/18/2020   BILITOT 0.3 03/18/2020   ALKPHOS 96 03/12/2009   AST 13 03/18/2020   ALT  11 03/18/2020   PROT 6.9 03/18/2020   ALBUMIN 4.0 03/12/2009   CALCIUM 8.9 03/18/2020   GFRAA 106 03/18/2020    Speciality Comments: PLQ Eye Exam: 11/13/2019 WNL @ [facility] follow up in 1 year  Procedures:  No procedures performed Allergies: Codeine, Lacosamide, Penicillins, Prochlorperazine, Sulfa antibiotics, Cetirizine, Iodinated diagnostic agents, Lisinopril, Naproxen, Nitrofurantoin, Other, Pregabalin, Compazine, Gabapentin, Lipitor [atorvastatin], Nitrofurantoin monohyd macro, Sulfasalazine, and Dexamethasone   Assessment / Plan:     Visit Diagnoses: Rheumatoid arthritis involving multiple sites with positive rheumatoid factor (HCC) -patient has no synovitis on examination.  She continues to have some arthralgias.  I will check sed rate today.  Plan: Sedimentation rate  High risk medication use - Plaquenil 200 mg 1 tablet by mouth twice daily. PLQ Eye Exam: 11/13/2019 -need for taking hydroxychloroquine  twice a day on a regular basis was emphasized.  We will check labs today.  Plan: CBC with Differential/Platelet, COMPLETE METABOLIC PANEL WITH GFR  Chronic left shoulder pain -she has been having pain and discomfort in the left shoulder joint.  She had limited range of motion.  Plan: XR Shoulder Left.  X-ray obtained today was unremarkable.  X-ray's were reviewed with the patient.  I offered physical therapy which she declined.  I have given her a handout on exercises.  I would avoid cortisone injection as she is diabetic.  She is advised to notify us if her symptoms do not improve.  Primary osteoarthritis of both hands-she had no synovitis on my examination  Fibromyalgia.-She continues to have some generalized pain and discomfort from fibromyalgia.  She has positive tender points.  Trapezius muscle spasm - She has not had any trigger point since her breast reduction on 01/28/20.  She takes Flexeril 10 mg 1 tablet by mouth every 8 hours as needed for muscle spasms.  Other medical problems are listed as follows:  Positive PPD  History of vitamin D deficiency  History of depression  History of pulmonary embolism  History of hypertension  History of psychosis  Moderate persistent asthma with acute exacerbation  Orders: Orders Placed This Encounter  Procedures  . XR Shoulder Left  . CBC with Differential/Platelet  . COMPLETE METABOLIC PANEL WITH GFR  . Sedimentation rate   No orders of the defined types were placed in this encounter.    Follow-Up Instructions: Return in about 5 months (around 11/21/2020) for Rheumatoid arthritis, Osteoarthritis.   Pollyann Savoy, MD  Note - This record has been created using Animal nutritionist.  Chart creation errors have been sought, but may not always  have been located. Such creation errors do not reflect on  the standard of medical care.

## 2020-06-22 ENCOUNTER — Encounter: Payer: Medicaid Other | Admitting: Counselor

## 2020-06-22 LAB — COMPLETE METABOLIC PANEL WITH GFR
AG Ratio: 1.3 (calc) (ref 1.0–2.5)
ALT: 13 U/L (ref 6–29)
AST: 14 U/L (ref 10–30)
Albumin: 4.1 g/dL (ref 3.6–5.1)
Alkaline phosphatase (APISO): 71 U/L (ref 31–125)
BUN: 9 mg/dL (ref 7–25)
CO2: 24 mmol/L (ref 20–32)
Calcium: 9.2 mg/dL (ref 8.6–10.2)
Chloride: 105 mmol/L (ref 98–110)
Creat: 0.82 mg/dL (ref 0.50–1.10)
GFR, Est African American: 103 mL/min/{1.73_m2} (ref >=60)
GFR, Est Non African American: 89 mL/min/{1.73_m2} (ref >=60)
Globulin: 3.1 g/dL (calc) (ref 1.9–3.7)
Glucose, Bld: 95 mg/dL (ref 65–99)
Potassium: 4.3 mmol/L (ref 3.5–5.3)
Sodium: 138 mmol/L (ref 135–146)
Total Bilirubin: 0.3 mg/dL (ref 0.2–1.2)
Total Protein: 7.2 g/dL (ref 6.1–8.1)

## 2020-06-22 LAB — CBC WITH DIFFERENTIAL/PLATELET
Absolute Monocytes: 680 cells/uL (ref 200–950)
Basophils Absolute: 40 cells/uL (ref 0–200)
Basophils Relative: 0.4 %
Eosinophils Absolute: 540 cells/uL — ABNORMAL HIGH (ref 15–500)
Eosinophils Relative: 5.4 %
HCT: 36.8 % (ref 35.0–45.0)
Hemoglobin: 11.7 g/dL (ref 11.7–15.5)
Lymphs Abs: 3900 cells/uL (ref 850–3900)
MCH: 26.7 pg — ABNORMAL LOW (ref 27.0–33.0)
MCHC: 31.8 g/dL — ABNORMAL LOW (ref 32.0–36.0)
MCV: 84 fL (ref 80.0–100.0)
MPV: 10.1 fL (ref 7.5–12.5)
Monocytes Relative: 6.8 %
Neutro Abs: 4840 cells/uL (ref 1500–7800)
Neutrophils Relative %: 48.4 %
Platelets: 264 10*3/uL (ref 140–400)
RBC: 4.38 10*6/uL (ref 3.80–5.10)
RDW: 14.5 % (ref 11.0–15.0)
Total Lymphocyte: 39 %
WBC: 10 10*3/uL (ref 3.8–10.8)

## 2020-06-22 LAB — SEDIMENTATION RATE: Sed Rate: 29 mm/h — ABNORMAL HIGH (ref 0–20)

## 2020-06-22 NOTE — Progress Notes (Signed)
CBC and CMP are stable.  Sed rate is mildly elevated.

## 2020-07-27 ENCOUNTER — Encounter: Payer: Medicaid Other | Admitting: Counselor

## 2020-08-03 ENCOUNTER — Encounter: Payer: Medicaid Other | Admitting: Counselor

## 2020-08-03 ENCOUNTER — Other Ambulatory Visit: Payer: Self-pay | Admitting: Physician Assistant

## 2020-08-03 DIAGNOSIS — M0579 Rheumatoid arthritis with rheumatoid factor of multiple sites without organ or systems involvement: Secondary | ICD-10-CM

## 2020-08-04 NOTE — Telephone Encounter (Signed)
Last Visit: 06/23/2020 Next Visit: 11/23/2020 Labs: 06/21/2020 CBC and CMP are stable. Sed rate is mildly elevated. Eye exam: 11/13/2019 WNL   Current Dose per office note 06/21/2020: Plaquenil 200 mg 1 tablet by mouth twice daily DX: Rheumatoid arthritis involving multiple sites with positive rheumatoid factor   Last Fill: 03/17/2020  Okay to refill Plaquenil?

## 2020-08-24 DIAGNOSIS — M542 Cervicalgia: Secondary | ICD-10-CM | POA: Insufficient documentation

## 2020-08-24 HISTORY — DX: Cervicalgia: M54.2

## 2020-09-07 ENCOUNTER — Other Ambulatory Visit (HOSPITAL_COMMUNITY): Payer: Self-pay | Admitting: Neurology

## 2020-09-07 DIAGNOSIS — M542 Cervicalgia: Secondary | ICD-10-CM

## 2020-09-07 NOTE — Progress Notes (Signed)
Office Visit Note  Patient: Haley Goodman             Date of Birth: October 18, 1980           MRN: [id number]             PCP: Leeanne Rio, MD Referring: Leeanne Rio, MD Visit Date: 09/08/2020 Occupation: @GUAROCC @  Subjective:  Pain in both hands  History of Present Illness: Haley Goodman is a 40 y.o. female with history of seropositive rheumatoid arthritis, osteoarthritis, and fibromyalgia.  She is taking Plaquenil 200 mg 1 tablet by mouth twice daily. She has not missed any doses recently.  Patient reports over the past 2 months has been experiencing recurrent flares in both hands and both wrist joints.  She states at times it is difficult to make a complete fist and she has noticed swelling on the tops of her hands and wrists.  She states that she has tried wearing arthritis compression gloves but at times has difficulty getting it on due to the swelling.  She states that her fibromyalgia has also been flaring recently.  She continues to follow-up with pain management on a regular basis.  She states she is scheduled for a C-spine MRI on 09/20/2020 due to increased neck pain and stiffness.  She is currently having left trapezius muscle spasms.  Patient reports that a prescription for ibuprofen was prescribed by her pain management specialist to help with her recurrent flares but she has not picked it up from the pharmacy yet.  She is apprehensive to take prednisone due to developing diabetes while on prednisone in the past.    Activities of Daily Living:  Patient reports joint stiffness all day  Patient Reports nocturnal pain.  Difficulty dressing/grooming: Reports Difficulty climbing stairs: Denies Difficulty getting out of chair: Denies Difficulty using hands for taps, buttons, cutlery, and/or writing: Reports  Review of Systems  Constitutional:  Positive for fatigue.  HENT:  Positive for mouth dryness. Negative for mouth sores and nose dryness.   Eyes:   Positive for dryness. Negative for pain and visual disturbance.  Respiratory:  Negative for cough, hemoptysis, shortness of breath and difficulty breathing.   Cardiovascular:  Negative for chest pain, palpitations, hypertension and swelling in legs/feet.  Gastrointestinal:  Negative for blood in stool, constipation and diarrhea.  Endocrine: Negative for increased urination.  Genitourinary:  Negative for painful urination.  Musculoskeletal:  Positive for joint pain, joint pain, joint swelling and morning stiffness. Negative for myalgias, muscle weakness, muscle tenderness and myalgias.  Skin:  Negative for color change, pallor, rash, hair loss, nodules/bumps, skin tightness, ulcers and sensitivity to sunlight.  Allergic/Immunologic: Negative for susceptible to infections.  Neurological:  Negative for dizziness, numbness, headaches and weakness.  Hematological:  Negative for swollen glands.  Psychiatric/Behavioral:  Positive for sleep disturbance. Negative for depressed mood. The patient is not nervous/anxious.    PMFS History:  Patient Active Problem List   Diagnosis Date Noted   MDD (major depressive disorder), recurrent episode, moderate ([place]) 03/22/2017   Encounter for therapeutic drug monitoring 01/11/2017   Rheumatoid arthritis involving multiple sites with positive rheumatoid factor ([place]) 05/10/2016   Fibromyalgia 05/10/2016   Primary osteoarthritis of both hands 05/10/2016   Chronic pain syndrome/ in pain manaement 05/10/2016   High risk medication use/ Plaquenil 05/10/2016   Spondylosis of lumbar region without myelopathy or radiculopathy 05/10/2016   Abnormal serum protein electrophoresis 05/10/2016   Vitamin D deficiency 05/10/2016   Uncontrolled type 2 diabetes  mellitus with complication, without long-term current use of insulin ([place]) 03/13/2016   Essential hypertension, benign 03/13/2016   Morbid obesity ([place]) 03/13/2016   Pulmonary embolism ([place]) 06/06/2011    Class: History  of    Past Medical History:  Diagnosis Date   Anemia    Aneurysm ([place])    Arthritis    Fibromyalgia    Hypertension    Lupus ([place])    PE (pulmonary embolism) 2009   RA (rheumatoid arthritis) ([place])     Family History  Problem Relation Age of Onset   Stroke Mother    Cancer Mother    Cancer Other    Diabetes Other    Cancer Sister    Pseudochol deficiency Neg Hx    Malignant hyperthermia Neg Hx    Hypotension Neg Hx    Anesthesia problems Neg Hx    Past Surgical History:  Procedure Laterality Date   ABLATION     BREAST REDUCTION SURGERY  01/28/2020   Dr. Sandi Mealy at [place]     HYSTEROSCOPY  12/30/2019   Dr. Jeanell Sparrow   TUBAL LIGATION     Social History   Social History Narrative   Not on file   Immunization History  Administered Date(s) Administered   Influenza Split 01/26/2014   Influenza, Seasonal, Injecte, Preservative Fre 12/05/2010, 12/23/2015   Influenza,inj,Quad PF,6+ Mos 12/05/2017   Influenza-Unspecified 03/30/2008, 11/22/2009, 02/11/2013, 12/18/2013, 11/25/2014   Moderna Sars-Covid-2 Vaccination 09/18/2019, 10/16/2019, 03/18/2020   PPD Test 10/03/2017   Pneumococcal Conjugate-13 02/27/2013   Pneumococcal Polysaccharide-23 04/07/2011   Tdap 09/10/2013     Objective: Vital Signs: BP 124/84 (BP Location: Left Arm, Patient Position: Sitting, Cuff Size: Large)   Pulse 67   Resp 12   Ht $R'5\' 7"'Jm$  (1.702 m)   Wt 223 lb 9.6 oz (101.4 kg)   BMI 35.02 kg/m    Physical Exam Vitals and nursing note reviewed.  Constitutional:      Appearance: She is well-developed.  HENT:     Head: Normocephalic and atraumatic.  Eyes:     Conjunctiva/sclera: Conjunctivae normal.  Pulmonary:     Effort: Pulmonary effort is normal.  Abdominal:     Palpations: Abdomen is soft.  Musculoskeletal:     Cervical back: Normal range of motion.  Skin:    General: Skin is warm and dry.     Capillary  Refill: Capillary refill takes less than 2 seconds.  Neurological:     Mental Status: She is alert and oriented to person, place, and time.  Psychiatric:        Behavior: Behavior normal.     Musculoskeletal Exam: Generalized hyperalgesia and positive tender points on exam.  C-spine is limited range of motion with discomfort.  Trapezius muscle tension and tenderness bilaterally.  Shoulder joints are painful and limited range of motion to about 120 degrees bilaterally with.  Elbow joints, wrist joints, MCPs, PIPs, DIPs have good range of motion with no synovitis.  Tenderness palpation over both wrist joints noted.  Complete fist formation bilaterally.  Hip joints have good range of motion with no discomfort at this time.  No tenderness over trochanteric bursa bilaterally.  Knee joints have good range of motion with no warmth or effusion.  Ankle joints have good range of motion with no tenderness or joint swelling.  CDAI Exam: CDAI Score: -- Patient Global: --; Provider Global: -- Swollen: --; Tender: -- Joint Exam  09/08/2020   No joint exam has been documented for this visit   There is currently no information documented on the homunculus. Go to the Rheumatology activity and complete the homunculus joint exam.  Investigation: No additional findings.  Imaging: No results found.  Recent Labs: Lab Results  Component Value Date   WBC 10.0 06/21/2020   HGB 11.7 06/21/2020   PLT 264 06/21/2020   NA 138 06/21/2020   K 4.3 06/21/2020   CL 105 06/21/2020   CO2 24 06/21/2020   GLUCOSE 95 06/21/2020   BUN 9 06/21/2020   CREATININE 0.82 06/21/2020   BILITOT 0.3 06/21/2020   ALKPHOS 96 03/12/2009   AST 14 06/21/2020   ALT 13 06/21/2020   PROT 7.2 06/21/2020   ALBUMIN 4.0 03/12/2009   CALCIUM 9.2 06/21/2020   GFRAA 103 06/21/2020    Speciality Comments: PLQ Eye Exam: 11/13/2019 WNL @ [facility] follow up in 1 year  Procedures:  No procedures performed Allergies:  Codeine, Lacosamide, Penicillins, Prochlorperazine, Sulfa antibiotics, Cetirizine, Ciprofloxacin, Iodinated diagnostic agents, Lisinopril, Naproxen, Nitrofurantoin, Other, Pregabalin, Compazine, Gabapentin, Lipitor [atorvastatin], Nitrofurantoin monohyd macro, Sulfasalazine, and Dexamethasone   Assessment / Plan:     Visit Diagnoses: Rheumatoid arthritis involving multiple sites with positive rheumatoid factor ([place]): She has been experiencing increased joint pain and intermittent inflammation in both hands and both wrist joints over the past few months.  She has not been performing any overuse activities and has been unable to identify a trigger for the recurrent flares.  She has been taking Plaquenil 200 mg 1 tablet by mouth twice daily as prescribed and has not missed any doses recently.  Her fibromyalgia has also been flaring recently so she has been following up with pain management closely.  She describes possible extensor tenosynovitis in her wrist at times but did not bring any pictures with her and had no evidence of tenosynovitis on examination today.  We discussed scheduling an ultrasound of both hands to assess for synovitis.  She is apprehensive to make any medication changes at this time.  She will continue on Plaquenil as prescribed.  She plans on picking up the prescription for ibuprofen which was sent to the pharmacy by her pain management specialist.  We discussed avoiding NSAID use 1 week prior to the scheduled ultrasound.  She voiced understanding.  She will follow-up in the office in 2 to 3 months.  High risk medication use - Plaquenil 200 mg 1 tablet by mouth twice daily.  She is apprehensive to make any medication changes.  We briefly discussed Arava vs. methotrexate but she declined both medications at this time.  CBC and CMP were drawn on 06/21/2020.  ESR was 29 on 06/21/2020.  PLQ Eye Exam: 11/13/2019 WNL @ [facility] follow up in 1 year.  Due to update Plaquenil eye exam and  lab work in September 2022.  Chronic left shoulder pain - X-ray obtained was unremarkable on 06/21/2020.  She has limited range of motion of both shoulder joints on examination today to about 120 degrees.  Most of her discomfort seems muscular in origin.  She has tenderness to palpation over bilateral trapezius muscles.  She will benefit from physical therapy.  A referral was placed today.  Primary osteoarthritis of both hands: She has been experiencing increased pain and stiffness in both hands over the past 2 months.  She has noticed intermittent inflammation in her wrists as well as several MCP joints.  She had no synovitis on examination  today.  She had updated x-rays of both hands on 09/12/2019.  She will be scheduled for an ultrasound of both hands to assess for synovitis.  She plans on taking the prescription for ibuprofen which was recently prescribed by her pain management specialist.  We discussed avoiding NSAID use about 1 week prior to having the ultrasound performed. We discussed the importance of joint protection and muscle strengthening.  She can continue to use arthritis compression gloves as needed.  Fibromyalgia: She has generalized hyperalgesia and positive tender points on examination.  She has been experiencing recurrent flares over the past 2 months.  She is currently having generalized myalgias and muscle tenderness in bilateral upper extremities.  She has been having increased neck pain and stiffness and has trapezius muscle tension and tenderness bilaterally.  We discussed that she may benefit from massage or myofascial release.  She is also interested in possible acupuncture or dry needling.  A referral to integrative therapies was placed today.  We also discussed possible water therapy but she is concerned about the water temperature not being at a therapeutic level.  She plans on continuing to follow-up with pain management.  Trapezius muscle spasm - She has not had any trigger  point since her breast reduction on 01/28/20.  She takes Flexeril 10 mg 1 tablet by mouth every 8 hours as needed for muscle spasms.  She is been experiencing increased trapezius muscle tension and tenderness bilaterally, left greater than right.  We discussed that she may benefit from massage and myofascial release.  A referral to integrative therapies will also be placed today.  Other medical conditions are listed as follows:  Positive PPD  History of depression: She remains on Cymbalta as prescribed.  History of vitamin D deficiency: She is taking vitamin D 50,000 units once weekly.  History of hypertension: Blood pressure was 124/84 today in the office.  History of pulmonary embolism  History of psychosis  Moderate persistent asthma with acute exacerbation  Orders: No orders of the defined types were placed in this encounter.  No orders of the defined types were placed in this encounter.    Follow-Up Instructions: Return in about 3 months (around 12/09/2020) for Rheumatoid arthritis, Osteoarthritis, Fibromyalgia.   Ofilia Neas, PA-C  Note - This record has been created using Dragon software.  Chart creation errors have been sought, but may not always  have been located. Such creation errors do not reflect on  the standard of medical care.

## 2020-09-08 ENCOUNTER — Ambulatory Visit (INDEPENDENT_AMBULATORY_CARE_PROVIDER_SITE_OTHER): Payer: 59 | Admitting: Physician Assistant

## 2020-09-08 ENCOUNTER — Other Ambulatory Visit: Payer: Self-pay

## 2020-09-08 ENCOUNTER — Encounter: Payer: Self-pay | Admitting: Physician Assistant

## 2020-09-08 VITALS — BP 124/84 | HR 67 | Resp 12 | Ht 67.0 in | Wt 223.6 lb

## 2020-09-08 DIAGNOSIS — M19042 Primary osteoarthritis, left hand: Secondary | ICD-10-CM

## 2020-09-08 DIAGNOSIS — Z79899 Other long term (current) drug therapy: Secondary | ICD-10-CM

## 2020-09-08 DIAGNOSIS — M25512 Pain in left shoulder: Secondary | ICD-10-CM

## 2020-09-08 DIAGNOSIS — M19041 Primary osteoarthritis, right hand: Secondary | ICD-10-CM

## 2020-09-08 DIAGNOSIS — G8929 Other chronic pain: Secondary | ICD-10-CM

## 2020-09-08 DIAGNOSIS — M797 Fibromyalgia: Secondary | ICD-10-CM

## 2020-09-08 DIAGNOSIS — Z86711 Personal history of pulmonary embolism: Secondary | ICD-10-CM

## 2020-09-08 DIAGNOSIS — Z8659 Personal history of other mental and behavioral disorders: Secondary | ICD-10-CM

## 2020-09-08 DIAGNOSIS — R7611 Nonspecific reaction to tuberculin skin test without active tuberculosis: Secondary | ICD-10-CM

## 2020-09-08 DIAGNOSIS — J4541 Moderate persistent asthma with (acute) exacerbation: Secondary | ICD-10-CM

## 2020-09-08 DIAGNOSIS — M0579 Rheumatoid arthritis with rheumatoid factor of multiple sites without organ or systems involvement: Secondary | ICD-10-CM | POA: Diagnosis not present

## 2020-09-08 DIAGNOSIS — Z8639 Personal history of other endocrine, nutritional and metabolic disease: Secondary | ICD-10-CM

## 2020-09-08 DIAGNOSIS — M62838 Other muscle spasm: Secondary | ICD-10-CM

## 2020-09-08 DIAGNOSIS — Z8679 Personal history of other diseases of the circulatory system: Secondary | ICD-10-CM

## 2020-09-08 NOTE — Addendum Note (Signed)
Addended by: Ellen Henri on: 09/08/2020 02:33 PM   Modules accepted: Orders

## 2020-09-16 ENCOUNTER — Encounter: Payer: Medicaid Other | Admitting: Counselor

## 2020-09-20 ENCOUNTER — Encounter (HOSPITAL_COMMUNITY): Payer: Self-pay

## 2020-09-20 ENCOUNTER — Ambulatory Visit (HOSPITAL_COMMUNITY): Payer: Medicaid Other

## 2020-09-23 ENCOUNTER — Encounter: Payer: Medicaid Other | Admitting: Counselor

## 2020-09-29 ENCOUNTER — Other Ambulatory Visit: Payer: Medicaid Other | Admitting: Rheumatology

## 2020-10-04 ENCOUNTER — Ambulatory Visit (HOSPITAL_COMMUNITY): Payer: 59 | Attending: Neurology

## 2020-11-09 ENCOUNTER — Encounter: Payer: Self-pay | Admitting: Counselor

## 2020-11-23 ENCOUNTER — Ambulatory Visit: Payer: Medicaid Other | Admitting: Physician Assistant

## 2020-12-03 NOTE — Progress Notes (Deleted)
Office Visit Note  Patient: Haley Goodman             Date of Birth: 1980-05-11           MRN: [id number]             PCP: Suzan Slick, MD Referring: Suzan Slick, MD Visit Date: 12/09/2020 Occupation: @GUAROCC @  Subjective:    History of Present Illness: Tabor Bartram is a 40 y.o. female with history of seropositive rheumatoid arthritis, osteoarthritis, and fibromyalgia.   Referred to PT  Ultrasound of hands?  Activities of Daily Living:  Patient reports morning stiffness for *** {minute/hour:19697}.   Patient {ACTIONS;DENIES/REPORTS:21021675::"Denies"} nocturnal pain.  Difficulty dressing/grooming: {ACTIONS;DENIES/REPORTS:21021675::"Denies"} Difficulty climbing stairs: {ACTIONS;DENIES/REPORTS:21021675::"Denies"} Difficulty getting out of chair: {ACTIONS;DENIES/REPORTS:21021675::"Denies"} Difficulty using hands for taps, buttons, cutlery, and/or writing: {ACTIONS;DENIES/REPORTS:21021675::"Denies"}  No Rheumatology ROS completed.   PMFS History:  Patient Active Problem List   Diagnosis Date Noted   MDD (major depressive disorder), recurrent episode, moderate (HCC) 03/22/2017   Encounter for therapeutic drug monitoring 01/11/2017   Rheumatoid arthritis involving multiple sites with positive rheumatoid factor (HCC) 05/10/2016   Fibromyalgia 05/10/2016   Primary osteoarthritis of both hands 05/10/2016   Chronic pain syndrome/ in pain manaement 05/10/2016   High risk medication use/ Plaquenil 05/10/2016   Spondylosis of lumbar region without myelopathy or radiculopathy 05/10/2016   Abnormal serum protein electrophoresis 05/10/2016   Vitamin D deficiency 05/10/2016   Uncontrolled type 2 diabetes mellitus with complication, without long-term current use of insulin 03/13/2016   Essential hypertension, benign 03/13/2016   Morbid obesity (HCC) 03/13/2016   Pulmonary embolism (HCC) 06/06/2011    Class: History of    Past Medical History:  Diagnosis  Date   Anemia    Aneurysm (HCC)    Arthritis    Fibromyalgia    Hypertension    Lupus (HCC)    PE (pulmonary embolism) 2009   RA (rheumatoid arthritis) (HCC)     Family History  Problem Relation Age of Onset   Stroke Mother    Cancer Mother    Cancer Other    Diabetes Other    Cancer Sister    Pseudochol deficiency Neg Hx    Malignant hyperthermia Neg Hx    Hypotension Neg Hx    Anesthesia problems Neg Hx    Past Surgical History:  Procedure Laterality Date   ABLATION     BREAST REDUCTION SURGERY  01/28/2020   Dr. 14/02/2019 at [facility]   CHOLECYSTECTOMY     DILATION AND CURETTAGE OF UTERUS     HYSTEROSCOPY  12/30/2019   Dr. 13/03/2019   TUBAL LIGATION     Social History   Social History Narrative   Not on file   Immunization History  Administered Date(s) Administered   Influenza Split 01/26/2014   Influenza, Seasonal, Injecte, Preservative Fre 12/05/2010, 12/23/2015   Influenza,inj,Quad PF,6+ Mos 12/05/2017   Influenza-Unspecified 03/30/2008, 11/22/2009, 02/11/2013, 12/18/2013, 11/25/2014   Moderna Sars-Covid-2 Vaccination 09/18/2019, 10/16/2019, 03/18/2020   PPD Test 10/03/2017   Pneumococcal Conjugate-13 02/27/2013   Pneumococcal Polysaccharide-23 04/07/2011   Tdap 09/10/2013     Objective: Vital Signs: There were no vitals taken for this visit.   Physical Exam Vitals and nursing note reviewed.  Constitutional:      Appearance: She is well-developed.  HENT:     Head: Normocephalic and atraumatic.  Eyes:     Conjunctiva/sclera: Conjunctivae normal.  Pulmonary:     Effort: Pulmonary effort is normal.  Abdominal:  Palpations: Abdomen is soft.  Musculoskeletal:     Cervical back: Normal range of motion.  Lymphadenopathy:     Cervical: No cervical adenopathy.  Skin:    General: Skin is warm and dry.     Capillary Refill: Capillary refill takes less than 2 seconds.  Neurological:     Mental Status: She is alert and oriented to  person, place, and time.  Psychiatric:        Behavior: Behavior normal.     Musculoskeletal Exam: ***  CDAI Exam: CDAI Score: -- Patient Global: --; Provider Global: -- Swollen: --; Tender: -- Joint Exam 12/09/2020   No joint exam has been documented for this visit   There is currently no information documented on the homunculus. Go to the Rheumatology activity and complete the homunculus joint exam.  Investigation: No additional findings.  Imaging: No results found.  Recent Labs: Lab Results  Component Value Date   WBC 10.0 06/21/2020   HGB 11.7 06/21/2020   PLT 264 06/21/2020   NA 138 06/21/2020   K 4.3 06/21/2020   CL 105 06/21/2020   CO2 24 06/21/2020   GLUCOSE 95 06/21/2020   BUN 9 06/21/2020   CREATININE 0.82 06/21/2020   BILITOT 0.3 06/21/2020   ALKPHOS 96 03/12/2009   AST 14 06/21/2020   ALT 13 06/21/2020   PROT 7.2 06/21/2020   ALBUMIN 4.0 03/12/2009   CALCIUM 9.2 06/21/2020   GFRAA 103 06/21/2020    Speciality Comments: PLQ Eye Exam: 11/13/2019 WNL @ [facility] follow up in 1 year  Procedures:  No procedures performed Allergies: Codeine, Lacosamide, Penicillins, Prochlorperazine, Sulfa antibiotics, Cetirizine, Ciprofloxacin, Iodinated diagnostic agents, Lisinopril, Naproxen, Nitrofurantoin, Other, Pregabalin, Compazine, Gabapentin, Lipitor [atorvastatin], Nitrofurantoin monohyd macro, Sulfasalazine, and Dexamethasone   Assessment / Plan:     Visit Diagnoses: Rheumatoid arthritis involving multiple sites with positive rheumatoid factor (HCC)  High risk medication use  Chronic left shoulder pain  Primary osteoarthritis of both hands  Fibromyalgia  Trapezius muscle spasm  Positive PPD  History of depression  History of vitamin D deficiency  History of hypertension  History of pulmonary embolism  History of psychosis  Moderate persistent asthma with acute exacerbation  Orders: No orders of the defined types were  placed in this encounter.  No orders of the defined types were placed in this encounter.   Face-to-face time spent with patient was *** minutes. Greater than 50% of time was spent in counseling and coordination of care.  Follow-Up Instructions: No follow-ups on file.   Gearldine Bienenstock, PA-C  Note - This record has been created using Dragon software.  Chart creation errors have been sought, but may not always  have been located. Such creation errors do not reflect on  the standard of medical care.

## 2020-12-09 ENCOUNTER — Ambulatory Visit: Payer: Medicaid Other | Admitting: Physician Assistant

## 2020-12-09 DIAGNOSIS — G8929 Other chronic pain: Secondary | ICD-10-CM

## 2020-12-09 DIAGNOSIS — M62838 Other muscle spasm: Secondary | ICD-10-CM

## 2020-12-09 DIAGNOSIS — Z8679 Personal history of other diseases of the circulatory system: Secondary | ICD-10-CM

## 2020-12-09 DIAGNOSIS — J4541 Moderate persistent asthma with (acute) exacerbation: Secondary | ICD-10-CM

## 2020-12-09 DIAGNOSIS — M19041 Primary osteoarthritis, right hand: Secondary | ICD-10-CM

## 2020-12-09 DIAGNOSIS — R7611 Nonspecific reaction to tuberculin skin test without active tuberculosis: Secondary | ICD-10-CM

## 2020-12-09 DIAGNOSIS — Z86711 Personal history of pulmonary embolism: Secondary | ICD-10-CM

## 2020-12-09 DIAGNOSIS — Z8659 Personal history of other mental and behavioral disorders: Secondary | ICD-10-CM

## 2020-12-09 DIAGNOSIS — M797 Fibromyalgia: Secondary | ICD-10-CM

## 2020-12-09 DIAGNOSIS — M0579 Rheumatoid arthritis with rheumatoid factor of multiple sites without organ or systems involvement: Secondary | ICD-10-CM

## 2020-12-09 DIAGNOSIS — Z8639 Personal history of other endocrine, nutritional and metabolic disease: Secondary | ICD-10-CM

## 2020-12-09 DIAGNOSIS — Z79899 Other long term (current) drug therapy: Secondary | ICD-10-CM

## 2020-12-13 ENCOUNTER — Telehealth: Payer: Self-pay | Admitting: Rheumatology

## 2020-12-13 NOTE — Telephone Encounter (Signed)
I received a message from the pharmacy for the refill on Plaquenil. Please check why were the recent appointments canceled. I do not see an eye exam from 2022. No labs since 04/22. Please schedule an appointment with Ladona Ridgel this week so that the  patient can be examined and we can refill Plaquenil.  Thank you, Pollyann Savoy, MD

## 2020-12-14 ENCOUNTER — Telehealth: Payer: Self-pay | Admitting: *Deleted

## 2020-12-14 ENCOUNTER — Telehealth: Payer: Self-pay

## 2020-12-14 ENCOUNTER — Encounter: Payer: Medicaid Other | Admitting: Counselor

## 2020-12-14 NOTE — Telephone Encounter (Signed)
Patient called and schedule appointment with Taylor on Wednesday, 12/15/20 at 9:20 am.  Patient states she is scheduled for plaquenil eye exam on 01/25/21 with Dr. [facility].   

## 2020-12-14 NOTE — Telephone Encounter (Signed)
Noted  

## 2020-12-14 NOTE — Telephone Encounter (Signed)
Patient called and schedule appointment with Ladona Ridgel on Wednesday, 12/15/20 at 9:20 am.  Patient states she is scheduled for plaquenil eye exam on 01/25/21 with Dr. Dione Booze.

## 2020-12-14 NOTE — Telephone Encounter (Signed)
I received a message from the pharmacy for the refill on Plaquenil. Please check why were the recent appointments canceled. I do not see an eye exam from 2022. No labs since 04/22. Please schedule an appointment with Ladona Ridgel this week so that the  patient can be examined and we can refill Plaquenil.  Thank you, Pollyann Savoy, MD   Attempted to contact the patient and left message for patient to call the office.

## 2020-12-14 NOTE — Progress Notes (Signed)
Office Visit Note  Patient: Haley Goodman             Date of Birth: January 07, 1981           MRN: [id number]             PCP: Suzan Slick, MD Referring: Suzan Slick, MD Visit Date: 12/15/2020 Occupation: @GUAROCC @  Subjective:  Pain in multiple joints   History of Present Illness: Haley Goodman is a 40 y.o. female with history of seropositive rheumatoid arthritis, osteoarthritis, and fibromyalgia.  She is taking Plaquenil 200 mg 1 tablet by mouth twice daily.  She continues to tolerate Plaquenil without any side effects.  She has a Plaquenil eye exam scheduled on 01/25/2021 at [facility].  She needs a Plaquenil refill sent to the pharmacy today since she has been out of the prescription for the past 3 days.  She continues to have recurrent rheumatoid arthritis flares involving multiple joints including both shoulders, both wrists, both hands.  With the cooler weather temperatures these flares have been more frequent.  This past weekend she was experiencing swelling in the right wrist extending on the dorsal aspect of her right hand.  She was having difficulty using her hands to perform ADLs due to severity of pain and swelling.  She tried wearing compression gloves but it made her pain worse.  She also tried using Tiger balm as well as hemp cream topically which provided temporary relief.  She has been taking diclofenac 75 mg 1 tablet daily for pain relief.  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  She has been apprehensive to start on combination therapy and does not want to start on methotrexate due to the concern for possible side effects.  She states that she has also been experiencing more frequent fibromyalgia flares.  She has having myalgias and muscle tenderness at this time.  She would like a new prescription for a TENS unit which has alleviated her symptoms in the past.  She states that she has been experiencing more frequent  palpitations and has called to schedule appointment with her cardiologist for further evaluation.  Her last EKG was in 2018.  Activities of Daily Living:  Patient reports morning stiffness for all day. Patient Reports nocturnal pain.  Difficulty dressing/grooming: Denies Difficulty climbing stairs: Denies Difficulty getting out of chair: Denies Difficulty using hands for taps, buttons, cutlery, and/or writing: Reports  Review of Systems  Constitutional:  Positive for fatigue.  HENT:  Positive for mouth dryness. Negative for mouth sores and nose dryness.   Eyes:  Positive for dryness. Negative for pain and itching.  Respiratory:  Positive for shortness of breath. Negative for difficulty breathing.   Cardiovascular:  Positive for palpitations. Negative for chest pain.  Gastrointestinal:  Negative for blood in stool, constipation and diarrhea.  Endocrine: Negative for increased urination.  Genitourinary:  Negative for difficulty urinating.  Musculoskeletal:  Positive for joint pain, joint pain, joint swelling, myalgias, morning stiffness, muscle tenderness and myalgias.  Skin:  Negative for color change, rash and redness.  Allergic/Immunologic: Positive for susceptible to infections.  Neurological:  Negative for dizziness, numbness, headaches, memory loss and weakness.  Hematological:  Positive for bruising/bleeding tendency.  Psychiatric/Behavioral:  Negative for confusion.    PMFS History:  Patient Active Problem List   Diagnosis Date Noted   MDD (major depressive disorder), recurrent episode, moderate (HCC) 03/22/2017   Encounter for therapeutic drug monitoring 01/11/2017   Rheumatoid  arthritis involving multiple sites with positive rheumatoid factor (HCC) 05/10/2016   Fibromyalgia 05/10/2016   Primary osteoarthritis of both hands 05/10/2016   Chronic pain syndrome/ in pain manaement 05/10/2016   High risk medication use/ Plaquenil 05/10/2016   Spondylosis of lumbar region without  myelopathy or radiculopathy 05/10/2016   Abnormal serum protein electrophoresis 05/10/2016   Vitamin D deficiency 05/10/2016   Uncontrolled type 2 diabetes mellitus with complication, without long-term current use of insulin 03/13/2016   Essential hypertension, benign 03/13/2016   Morbid obesity (HCC) 03/13/2016   Pulmonary embolism (HCC) 06/06/2011    Class: History of    Past Medical History:  Diagnosis Date   Anemia    Aneurysm (HCC)    Arthritis    Fibromyalgia    Hypertension    Lupus (HCC)    PE (pulmonary embolism) 2009   RA (rheumatoid arthritis) (HCC)     Family History  Problem Relation Age of Onset   Stroke Mother    Cancer Mother    Cancer Other    Diabetes Other    Cancer Sister    Pseudochol deficiency Neg Hx    Malignant hyperthermia Neg Hx    Hypotension Neg Hx    Anesthesia problems Neg Hx    Past Surgical History:  Procedure Laterality Date   ABLATION     BREAST REDUCTION SURGERY  01/28/2020   Dr. Deforest Hoyles at [facility]   CHOLECYSTECTOMY     DILATION AND CURETTAGE OF UTERUS     HYSTEROSCOPY  12/30/2019   Dr. Rosalia Hammers   TUBAL LIGATION     Social History   Social History Narrative   Not on file   Immunization History  Administered Date(s) Administered   Influenza Split 01/26/2014   Influenza, Seasonal, Injecte, Preservative Fre 12/05/2010, 12/23/2015   Influenza,inj,Quad PF,6+ Mos 12/05/2017   Influenza-Unspecified 03/30/2008, 11/22/2009, 02/11/2013, 12/18/2013, 11/25/2014   Moderna Sars-Covid-2 Vaccination 09/18/2019, 10/16/2019, 03/18/2020   PPD Test 10/03/2017   Pneumococcal Conjugate-13 02/27/2013   Pneumococcal Polysaccharide-23 04/07/2011   Tdap 09/10/2013     Objective: Vital Signs: BP (!) 142/97 (BP Location: Left Arm, Patient Position: Sitting, Cuff Size: Large)   Pulse 67   Ht 5\' 7"  (1.702 m)   Wt 221 lb (100.2 kg)   BMI 34.61 kg/m    Physical Exam Vitals and nursing note reviewed.  Constitutional:       Appearance: She is well-developed.  HENT:     Head: Normocephalic and atraumatic.  Eyes:     Conjunctiva/sclera: Conjunctivae normal.  Pulmonary:     Effort: Pulmonary effort is normal.  Abdominal:     Palpations: Abdomen is soft.  Musculoskeletal:     Cervical back: Normal range of motion.  Skin:    General: Skin is warm and dry.     Capillary Refill: Capillary refill takes less than 2 seconds.  Neurological:     Mental Status: She is alert and oriented to person, place, and time.  Psychiatric:        Behavior: Behavior normal.     Musculoskeletal Exam: Generalized hyperalgesia and positive tender points on examination.  C-spine has good range of motion with no discomfort.  Trapezius muscle tension and tenderness bilaterally.  Painful range of motion of both shoulders to about 120 degrees bilaterally.  Tenderness over the right shoulder noted.  Elbow joints have good range of motion with no tenderness or inflammation.  Painful range of motion and tenderness over both wrist joints.  Tenderness of  several MCPs in both hands as described below.  Complete fist formation bilaterally.  Right hip has painful and limited range of motion.  Left hip has good range of motion with no discomfort.  Knee joints have good range of motion with no warmth or effusion.  Ankle joints have good range of motion with tenderness and fullness of the right ankle.  No tenderness over MTP joints.  CDAI Exam: CDAI Score: 11.2  Patient Global: 6 mm; Provider Global: 6 mm Swollen: 0 ; Tender: 11  Joint Exam 12/15/2020      Right  Left  Glenohumeral   Tender   Tender  Wrist   Tender   Tender  MCP 2   Tender   Tender  MCP 3   Tender   Tender  MCP 4   Tender     MCP 5   Tender     Ankle   Tender        Investigation: No additional findings.  Imaging: No results found.  Recent Labs: Lab Results  Component Value Date   WBC 10.0 06/21/2020   HGB 11.7 06/21/2020   PLT 264 06/21/2020   NA 138 06/21/2020    K 4.3 06/21/2020   CL 105 06/21/2020   CO2 24 06/21/2020   GLUCOSE 95 06/21/2020   BUN 9 06/21/2020   CREATININE 0.82 06/21/2020   BILITOT 0.3 06/21/2020   ALKPHOS 96 03/12/2009   AST 14 06/21/2020   ALT 13 06/21/2020   PROT 7.2 06/21/2020   ALBUMIN 4.0 03/12/2009   CALCIUM 9.2 06/21/2020   GFRAA 103 06/21/2020    Speciality Comments: PLQ Eye Exam: 11/13/2019 WNL @ [facility] follow up in 1 year  Procedures:  No procedures performed Allergies: Codeine, Lacosamide, Penicillins, Prochlorperazine, Sulfa antibiotics, Cetirizine, Ciprofloxacin, Iodinated diagnostic agents, Lisinopril, Naproxen, Nitrofurantoin, Other, Pregabalin, Compazine, Gabapentin, Lipitor [atorvastatin], Nitrofurantoin monohyd macro, Sulfasalazine, and Dexamethasone   Assessment / Plan:     Visit Diagnoses: Rheumatoid arthritis involving multiple sites with positive rheumatoid factor (HCC) -She continues to have recurrent rheumatoid arthritis flares involving multiple joints including both shoulder, both wrist joints, both hands, right knee, and both feet.  She has had recurrent episodes of extensor tenosynovitis in both wrist.  The most recent episode extensor tenosynovitis of the right wrist was this past weekend.  She was having difficulty performing ADLs due to the severity of pain and inflammation in her hands and wrist joints.  Her symptoms have significantly been interfering with her quality of life.  She has been taking Plaquenil 200 mg 1 tablet by mouth twice daily which she continues to tolerate.  At her last office visit on 09/08/2020 she had reported recurrent flares but declined combination therapy or any medication changes at that time.  With the cooler weather temperatures her symptoms have progressively been worsening.  She has been taking diclofenac 75 mg 1 tablet daily for pain relief.  She tries to avoid the use of prednisone or cortisone injections due to uncontrolled type 2 diabetes induced  by steroid use in the past.  Discussed that her rheumatoid arthritis does not seem well controlled especially with the recurrence of tenosynovitis in both wrists.  Different treatment options were discussed today in detail.  She declined the use of methotrexate due to being apprehensive of potential side effects.  Discussed starting Arava as combination therapy with Plaquenil.  She was in agreement.  Indications, contraindications, potential side effects of Arava were discussed today in detail and all questions  were addressed.  Consent was obtained.  She will be starting on Arava 10 mg daily for 2 weeks and if labs are stable she will increase to 20 mg daily.  Prescription pending lab results obtained today. She will remain on Plaquenil 200 mg 1 tablet by mouth twice daily.  She was advised to notify us if she cannot tolerate taking Arava.  She will follow-up in the office in 6 to 8 weeks to assess her response.  Plan: Sedimentation rate, hydroxychloroquine (PLAQUENIL) 200 MG tablet  Medication counseling:  Patient was counseled on the purpose, proper use, and adverse effects of leflunomide including risk of infection, nausea/diarrhea/weight loss, increase in blood pressure, rash, hair loss, tingling in the hands and feet, and signs and symptoms of interstitial lung disease.   Also counseled on Black Box warning of liver injury and importance of avoiding alcohol while on therapy. Discussed that there is the possibility of an increased risk of malignancy but it is not well understood if this increased risk is due to the medication or the disease state.  Counseled patient to avoid live vaccines. Recommend annual influenza, Pneumovax 23, Prevnar 13, and Shingrix as indicated.   Discussed the importance of frequent monitoring of liver function and blood count.  Standing orders placed.  Discussed importance of birth control while on leflunomide due to risk of congenital abnormalities, and patient confirms she has  had a tubal ligation.  Provided patient with educational materials on leflunomide and answered all questions.  Patient consented to [place] use, and consent will be uploaded into the media tab.   Patient dose will be 10 mg daily x2 weeks and if labs are stable she will increase to 20 mg daily.  Prescription pending lab results and/or insurance approval.  High risk medication use -A prescription for Ranae Plumber will be sent to the pharmacy pending lab results today.  She will remain on Plaquenil 200 mg 1 tablet by mouth twice daily.  She will start on Arava 10 mg 1 tablet daily for 2 weeks and if labs are stable at that time she will increase to 20 mg daily.  She is aware that she will need to return for lab work in 2 weeks x 2, 2 months, then every 3 months.  Standing orders for CBC and CMP will be placed today.   CBC and CMP were drawn on 06/21/2020.  She is overdue to update lab work.  Orders for CBC and CMP were released.  PLQ Eye Exam: 11/13/2019 WNL @ [facility] follow up in 1 year.  She has a PLQ eye exam scheduled on 01/25/21.Plan: CBC with Differential/Platelet, COMPLETE METABOLIC PANEL WITH GFR, IgG, IgA, IgM, IgG, IgA, IgM,    Discussed the importance of holding Arava if she develops signs or symptoms of an infection and to resume once the infection has completely cleared.  She voiced understanding.  Chronic pain of both shoulders: She presents today with significant pain in both shoulder joints especially the right shoulder.  She has painful limited abduction to about 120 degrees.  Tenderness of the right shoulder upon palpation today.  She is having difficulty raising her arms due to the severity of pain.  She has been taking diclofenac 75 mg 1 tablet daily for pain relief.  She declined prednisone or cortisone injection at this time.  Primary osteoarthritis of both hands: She has some PIP and DIP thickening consistent with osteoarthritis of both hands.  Complete fist formation  bilaterally.  Fibromyalgia: She has  generalized hyperalgesia and positive tender points on examination.  She has been having more frequent fibromyalgia flares with the cooler weather temperatures.  She is taking Flexeril 10 mg every 8 hours as needed for muscle spasms.  She is unable to go to integrative therapies since they do not accept her insurance.  Discussed a referral to PT at [facility] since they have a therapeutic pool but she would like to hold off at this time.  In the past her discomfort was alleviated by using a TENS unit.  A prescription for a TENS unit was provided to the patient.   Trapezius muscle spasm: She has trapezius muscle tension and tenderness bilaterally.  She experiences muscle spasms intermittently.  A prescription for TENS unit was provided today.  She plans on continuing to take Flexeril 10 mg every 8 hours as needed for muscle spasms.  Other medical conditions are listed as follows:   Positive PPD  History of depression  History of vitamin D deficiency: She is taking vitamin D 50,000 units once weekly.   History of hypertension  History of pulmonary embolism  History of psychosis  Moderate persistent asthma with acute exacerbation   Orders: Orders Placed This Encounter  Procedures   CBC with Differential/Platelet   COMPLETE METABOLIC PANEL WITH GFR   Sedimentation rate   IgG, IgA, IgM   IgG, IgA, IgM   CBC with Differential/Platelet   COMPLETE METABOLIC PANEL WITH GFR   Meds ordered this encounter  Medications   hydroxychloroquine (PLAQUENIL) 200 MG tablet    Sig: TAKE 1 TABLET(200 MG) BY MOUTH TWICE DAILY    Dispense:  60 tablet    Refill:  2      Follow-Up Instructions: Return in about 2 months (around 02/14/2021) for Rheumatoid arthritis, Osteoarthritis, Fibromyalgia.   Gearldine Bienenstock, PA-C  Note - This record has been created using Dragon software.  Chart creation errors have been sought, but may not always  have been located. Such  creation errors do not reflect on  the standard of medical care.

## 2020-12-15 ENCOUNTER — Other Ambulatory Visit: Payer: Self-pay

## 2020-12-15 ENCOUNTER — Encounter: Payer: Self-pay | Admitting: Physician Assistant

## 2020-12-15 ENCOUNTER — Ambulatory Visit (INDEPENDENT_AMBULATORY_CARE_PROVIDER_SITE_OTHER): Payer: 59 | Admitting: Physician Assistant

## 2020-12-15 VITALS — BP 142/97 | HR 67 | Ht 67.0 in | Wt 221.0 lb

## 2020-12-15 DIAGNOSIS — Z8659 Personal history of other mental and behavioral disorders: Secondary | ICD-10-CM

## 2020-12-15 DIAGNOSIS — Z111 Encounter for screening for respiratory tuberculosis: Secondary | ICD-10-CM

## 2020-12-15 DIAGNOSIS — M25511 Pain in right shoulder: Secondary | ICD-10-CM

## 2020-12-15 DIAGNOSIS — M19041 Primary osteoarthritis, right hand: Secondary | ICD-10-CM

## 2020-12-15 DIAGNOSIS — M0579 Rheumatoid arthritis with rheumatoid factor of multiple sites without organ or systems involvement: Secondary | ICD-10-CM

## 2020-12-15 DIAGNOSIS — Z79899 Other long term (current) drug therapy: Secondary | ICD-10-CM

## 2020-12-15 DIAGNOSIS — M25512 Pain in left shoulder: Secondary | ICD-10-CM

## 2020-12-15 DIAGNOSIS — R7611 Nonspecific reaction to tuberculin skin test without active tuberculosis: Secondary | ICD-10-CM

## 2020-12-15 DIAGNOSIS — J4541 Moderate persistent asthma with (acute) exacerbation: Secondary | ICD-10-CM

## 2020-12-15 DIAGNOSIS — Z86711 Personal history of pulmonary embolism: Secondary | ICD-10-CM

## 2020-12-15 DIAGNOSIS — G8929 Other chronic pain: Secondary | ICD-10-CM

## 2020-12-15 DIAGNOSIS — M797 Fibromyalgia: Secondary | ICD-10-CM

## 2020-12-15 DIAGNOSIS — Z8679 Personal history of other diseases of the circulatory system: Secondary | ICD-10-CM

## 2020-12-15 DIAGNOSIS — M19042 Primary osteoarthritis, left hand: Secondary | ICD-10-CM

## 2020-12-15 DIAGNOSIS — M62838 Other muscle spasm: Secondary | ICD-10-CM

## 2020-12-15 DIAGNOSIS — Z8639 Personal history of other endocrine, nutritional and metabolic disease: Secondary | ICD-10-CM

## 2020-12-15 MED ORDER — HYDROXYCHLOROQUINE SULFATE 200 MG PO TABS: ORAL_TABLET | ORAL | 2 refills | Status: DC

## 2020-12-15 NOTE — Progress Notes (Signed)
Pharmacy Note  Subjective: Patient presents today to the [facility] for follow up office visit.  Patient seen by the pharmacist for counseling on leflunomide Ranae Plumber) for rheumatoid arthritis.  Objective: CBC    Component Value Date/Time   WBC 10.0 06/21/2020 1220   RBC 4.38 06/21/2020 1220   HGB 11.7 06/21/2020 1220   HCT 36.8 06/21/2020 1220   PLT 264 06/21/2020 1220   MCV 84.0 06/21/2020 1220   MCH 26.7 (L) 06/21/2020 1220   MCHC 31.8 (L) 06/21/2020 1220   RDW 14.5 06/21/2020 1220   LYMPHSABS 3,900 06/21/2020 1220   MONOABS 0.3 02/17/2015 0340   EOSABS 540 (H) 06/21/2020 1220   BASOSABS 40 06/21/2020 1220    CMP     Component Value Date/Time   NA 138 06/21/2020 1220   K 4.3 06/21/2020 1220   CL 105 06/21/2020 1220   CO2 24 06/21/2020 1220   GLUCOSE 95 06/21/2020 1220   BUN 9 06/21/2020 1220   CREATININE 0.82 06/21/2020 1220   CALCIUM 9.2 06/21/2020 1220   PROT 7.2 06/21/2020 1220   ALBUMIN 4.0 03/12/2009 1553   AST 14 06/21/2020 1220   ALT 13 06/21/2020 1220   ALKPHOS 96 03/12/2009 1553   BILITOT 0.3 06/21/2020 1220   GFRNONAA 89 06/21/2020 1220   GFRAA 103 06/21/2020 1220    Baseline Immunosuppressant Therapy Labs HIV 1&2 AG/AB - non-reactive on 04/23/15  Per [facility]:  08/10/20 Hepatitis A IgM - negative Hepatitis B surface Ag - negative Hepatitis B core IgM antibody - negative Hepattis C antibody  <0.1  Per [facility]:  04/23/15 Hep B Core IgM Negative Negative    Hep A Ab, IgM Negative Negative    Hepatitis B Surface Ag Negative Negative    Hepatitis C Ab 0.0 - 0.9 s/co <0.1     Quantiferon TB gold on 08/10/20 - negative    Serum Protein Electrophoresis Latest Ref Rng & Units 06/21/2020  Total Protein 6.1 - 8.1 g/dL 7.2   1/51/7616 per [facility]where Albumin 2.9 - 4.4 g/dL 3.6   Beta Globulin 0.7 - 1.3 g/dL 1.6 High    Immunoglobulin M, Quant, CSF 26 - 217 mg/dL 71   Protein, Total 6.0 - 8.5 g/dL 7.4   WVPXT-0-[id number]  0.0 - 0.4 g/dL 0.2   IgG 627 - 0350 mg/dL 0,938   M Spike Not Observed g/dL Not Observed   Gamma Globulin 0.4 - 1.8 g/dL 1   IgA 87 - 182 mg/dL 993 High    ZJIRC-7-ELFYBOFB 0.4 - 1.0 g/dL 0.9   Globulin, Total 2.2 - 3.9 g/dL 3.8   A/G Ratio 0.7 - 1.7 1     No results found for: G6PDH  No results found for: TPMT   Pregnancy status:  tubal ligation and uterine curettage, tubal liation  Assessment/Plan:  Patient was counseled on the purpose, proper use, and adverse effects of leflunomide including risk of infection, nausea/diarrhea/weight loss, increase in blood pressure, rash, hair loss, tingling in the hands and feet, and signs and symptoms of interstitial lung disease.   Also counseled on Black Box warning of liver injury and importance of avoiding alcohol while on therapy. Discussed that there is the possibility of an increased risk of malignancy but it is not well understood if this increased risk is due to the medication or the disease state.  Counseled patient to avoid live vaccines. Recommend annual influenza, Pneumovax 23, Prevnar 13, and Shingrix as indicated.   Discussed the importance of frequent monitoring of  liver function and blood count.  Standing orders placed.  Discussed importance of birth control while on leflunomide due to risk of congenital abnormalities, and patient confirms she had tubal ligation.  Provided patient with educational materials on leflunomide and answered all questions.  Patient consented to [place] use, and consent will be uploaded into the media tab.    Patient dose will be leflunomide 10 mg once daily then repeat labs in 2 weeks. If stable, will increase dose to leflunomide 20mg  once daily If tolerated, patient advised to increase  Prescription pending lab results.  , PharmD, MPH, BCPS Clinical Pharmacist (Rheumatology and Pulmonology)

## 2020-12-15 NOTE — Patient Instructions (Addendum)
Vaccines You are taking a medication(s) that can suppress your immune system.  The following immunizations are recommended: Flu annually Covid-19  Td/Tdap (tetanus, diphtheria, pertussis) every 10 years Pneumonia (Prevnar 15 then Pneumovax 23 at least 1 year apart.  Alternatively, can take Prevnar 20 without needing additional dose) Shingrix: 2 doses from 4 weeks to 6 months apart  Please check with your PCP to make sure you are up to date.   Standing Labs We placed an order today for your standing lab work.   Please have your standing labs drawn in 2 weeks x 2, 1 month, then every 3 months  If possible, please have your labs drawn 2 weeks prior to your appointment so that the provider can discuss your results at your appointment.  Please note that you may see your imaging and lab results in MyChart before we have reviewed them. We may be awaiting multiple results to interpret others before contacting you. Please allow our office up to 72 hours to thoroughly review all of the results before contacting the office for clarification of your results.  We have open lab daily: Monday through Thursday from 1:30-4:30 PM and Friday from 1:30-4:00 PM at the office of Dr. Pollyann Savoy, [facility].   Please be advised, all patients with office appointments requiring lab work will take precedent over walk-in lab work.  If possible, please come for your lab work on Monday and Friday afternoons, as you may experience shorter wait times. The office is located at [address], [address], [place], [place] [address] No appointment is necessary.   Labs are drawn by [facility]. Please bring your co-pay at the time of your lab draw.  You may receive a bill from [facility] for your lab work.  If you wish to have your labs drawn at another location, please call the office 24 hours in advance to send orders.  If you have any questions regarding directions or hours of operation,  please call  [phone].   As a reminder, please drink plenty of water prior to coming for your lab work. Thanks!   Leflunomide tablets What is this medication? LEFLUNOMIDE (le FLOO na mide) is for rheumatoid arthritis. This medicine may be used for other purposes; ask your health care provider or pharmacist if you have questions. COMMON BRAND NAME(S): Arava What should I tell my care team before I take this medication? They need to know if you have any of these conditions: diabetes have a fever or infection high blood pressure immune system problems kidney disease liver disease low blood cell counts, like low white cell, platelet, or red cell counts lung or breathing disease, like asthma recently received or scheduled to receive a vaccine receiving treatment for cancer skin conditions or sensitivity tingling of the fingers or toes, or other nerve disorder tuberculosis an unusual or allergic reaction to leflunomide, teriflunomide, other medicines, food, dyes, or preservatives pregnant or trying to get pregnant breast-feeding How should I use this medication? Take this medicine by mouth with a full glass of water. Follow the directions on the prescription label. Take your medicine at regular intervals. Do not take your medicine more often than directed. Do not stop taking except on your doctor's advice. Talk to your pediatrician regarding the use of this medicine in children. Special care may be needed. Overdosage: If you think you have taken too much of this medicine contact a poison control center or emergency room at once. NOTE: This medicine is only for you.  Do not share this medicine with others. What if I miss a dose? If you miss a dose, take it as soon as you can. If it is almost time for your next dose, take only that dose. Do not take double or extra doses. What may interact with this medication? Do not take this medicine with any of the following medications: teriflunomide This  medicine may also interact with the following medications: alosetron birth control pills caffeine cefaclor certain medicines for diabetes like nateglinide, repaglinide, rosiglitazone, pioglitazone certain medicines for high cholesterol like atorvastatin, pravastatin, rosuvastatin, simvastatin charcoal cholestyramine ciprofloxacin duloxetine furosemide ketoprofen live virus vaccines medicines that increase your risk for infection methotrexate mitoxantrone paclitaxel penicillin theophylline tizanidine warfarin This list may not describe all possible interactions. Give your health care provider a list of all the medicines, herbs, non-prescription drugs, or dietary supplements you use. Also tell them if you smoke, drink alcohol, or use illegal drugs. Some items may interact with your medicine. What should I watch for while using this medication? Visit your health care provider for regular checks on your progress. Tell your doctor or health care provider if your symptoms do not start to get better or if they get worse. You may need blood work done while you are taking this medicine. This medicine may cause serious skin reactions. They can happen weeks to months after starting the medicine. Contact your health care provider right away if you notice fevers or flu-like symptoms with a rash. The rash may be red or purple and then turn into blisters or peeling of the skin. Or, you might notice a red rash with swelling of the face, lips or lymph nodes in your neck or under your arms. This medicine may stay in your body for up to 2 years after your last dose. Tell your doctor about any unusual side effects or symptoms. A medicine can be given to help lower your blood levels of this medicine more quickly. Women must use effective birth control with this medicine. There is a potential for serious side effects to an unborn child. Do not become pregnant while taking this medicine. Inform your doctor if  you wish to become pregnant. This medicine remains in your blood after you stop taking it. You must continue using effective birth control until the blood levels have been checked and they are low enough. A medicine can be given to help lower your blood levels of this medicine more quickly. Immediately talk to your doctor if you think you may be pregnant. You may need a pregnancy test. Talk to your health care provider or pharmacist for more information. You should not receive certain vaccines during your treatment and for a certain time after your treatment with this medication ends. Talk to your health care provider for more information. What side effects may I notice from receiving this medication? Side effects that you should report to your doctor or health care professional as soon as possible: allergic reactions like skin rash, itching or hives, swelling of the face, lips, or tongue breathing problems cough increased blood pressure low blood counts - this medicine may decrease the number of white blood cells and platelets. You may be at increased risk for infections and bleeding. pain, tingling, numbness in the hands or feet rash, fever, and swollen lymph nodes redness, blistering, peeing or loosening of the skin, including inside the mouth signs of decreased platelets or bleeding - bruising, pinpoint red spots on the skin, black, tarry stools, blood in urine  signs of infection - fever or chills, cough, sore throat, pain or trouble passing urine signs and symptoms of liver injury like dark yellow or brown urine; general ill feeling or flu-like symptoms; light-colored stools; loss of appetite; nausea; right upper belly pain; unusually weak or tired; yellowing of the eyes or skin trouble passing urine or change in the amount of urine vomiting Side effects that usually do not require medical attention (report to your doctor or health care professional if they continue or are  bothersome): diarrhea hair thinning or loss headache nausea tiredness This list may not describe all possible side effects. Call your doctor for medical advice about side effects. You may report side effects to FDA at 1-800-FDA-1088. Where should I keep my medication? Keep out of the reach of children. Store at room temperature between 15 and 30 degrees C (59 and 86 degrees F). Protect from moisture and light. Throw away any unused medicine after the expiration date. NOTE: This sheet is a summary. It may not cover all possible information. If you have questions about this medicine, talk to your doctor, pharmacist, or health care provider.  2022 Elsevier/Gold Standard (2018-05-17 15:06:48)

## 2020-12-16 ENCOUNTER — Other Ambulatory Visit: Payer: Self-pay | Admitting: *Deleted

## 2020-12-16 LAB — COMPLETE METABOLIC PANEL WITH GFR
AG Ratio: 1.2 (calc) (ref 1.0–2.5)
ALT: 12 U/L (ref 6–29)
AST: 17 U/L (ref 10–30)
Albumin: 4.2 g/dL (ref 3.6–5.1)
Alkaline phosphatase (APISO): 63 U/L (ref 31–125)
BUN: 11 mg/dL (ref 7–25)
CO2: 23 mmol/L (ref 20–32)
Calcium: 9 mg/dL (ref 8.6–10.2)
Chloride: 105 mmol/L (ref 98–110)
Creat: 0.73 mg/dL (ref 0.50–0.99)
Globulin: 3.5 g/dL (calc) (ref 1.9–3.7)
Glucose, Bld: 108 mg/dL — ABNORMAL HIGH (ref 65–99)
Potassium: 4.4 mmol/L (ref 3.5–5.3)
Sodium: 138 mmol/L (ref 135–146)
Total Bilirubin: 0.3 mg/dL (ref 0.2–1.2)
Total Protein: 7.7 g/dL (ref 6.1–8.1)
eGFR: 105 mL/min/{1.73_m2} (ref >=60)

## 2020-12-16 LAB — CBC WITH DIFFERENTIAL/PLATELET
Absolute Monocytes: 456 cells/uL (ref 200–950)
Basophils Absolute: 48 cells/uL (ref 0–200)
Basophils Relative: 0.6 %
Eosinophils Absolute: 440 cells/uL (ref 15–500)
Eosinophils Relative: 5.5 %
HCT: 36.9 % (ref 35.0–45.0)
Hemoglobin: 11.8 g/dL (ref 11.7–15.5)
Lymphs Abs: 3176 cells/uL (ref 850–3900)
MCH: 26.8 pg — ABNORMAL LOW (ref 27.0–33.0)
MCHC: 32 g/dL (ref 32.0–36.0)
MCV: 83.9 fL (ref 80.0–100.0)
MPV: 10.7 fL (ref 7.5–12.5)
Monocytes Relative: 5.7 %
Neutro Abs: 3880 cells/uL (ref 1500–7800)
Neutrophils Relative %: 48.5 %
Platelets: 270 10*3/uL (ref 140–400)
RBC: 4.4 10*6/uL (ref 3.80–5.10)
RDW: 13.4 % (ref 11.0–15.0)
Total Lymphocyte: 39.7 %
WBC: 8 10*3/uL (ref 3.8–10.8)

## 2020-12-16 LAB — IGG, IGA, IGM
IgG (Immunoglobin G), Serum: 1091 mg/dL (ref 600–1640)
IgM, Serum: 94 mg/dL (ref 50–300)
Immunoglobulin A: 681 mg/dL — ABNORMAL HIGH (ref 47–310)

## 2020-12-16 LAB — SEDIMENTATION RATE: Sed Rate: 29 mm/h — ABNORMAL HIGH (ref 0–20)

## 2020-12-16 MED ORDER — LEFLUNOMIDE 10 MG PO TABS: ORAL_TABLET | ORAL | 0 refills | Status: DC

## 2020-12-16 NOTE — Telephone Encounter (Signed)
Per office note on 12/15/2020: Patient dose will be leflunomide 10 mg once daily then repeat labs in 2 weeks. If stable, will increase dose to leflunomide 20mg  once daily If tolerated, patient advised to increase  Prescription pending lab results.

## 2020-12-16 NOTE — Progress Notes (Signed)
CBC WNL.  Glucose is 108. Rest of CMP WNL.  ESR remains slightly elevated but stable.   IgA is elevated. Rest of immunoglobulins WNL.   Ok to send in prescription for arava as discussed yesterday at her visit. She should return for lab work in 2 weeks and then 2 weeks after increasing the dose to 20 mg daily.

## 2020-12-16 NOTE — Telephone Encounter (Signed)
-----  Message from Ofilia Neas, PA-C sent at 12/16/2020  8:16 AM EDT ----- CBC WNL.  Glucose is 108. Rest of CMP WNL.  ESR remains slightly elevated but stable.   IgA is elevated. Rest of immunoglobulins WNL.   Ok to send in prescription for arava as discussed yesterday at her visit. She should return for lab work in 2 weeks and then 2 weeks after increasing the dose to 20 mg daily.

## 2020-12-22 ENCOUNTER — Encounter: Payer: Medicaid Other | Admitting: Counselor

## 2021-01-24 LAB — HM DIABETES EYE EXAM

## 2021-02-10 ENCOUNTER — Telehealth: Payer: Self-pay | Admitting: *Deleted

## 2021-02-10 NOTE — Telephone Encounter (Signed)
Patient states she was suppose to start [place]. Patient states she has not started the [place]. Patient states she waited to start because she got her Covid booster. Patient states she also just started a new job. Patient states she will start the [place] in January. Patient states she is aware she will be due for labs 2 weeks after starting the Arava. Patient has a follow up appointment on 03/17/2020. Do you want to push this out since patient won't be starting the Arava until January 2023. Please advise.

## 2021-02-10 NOTE — Telephone Encounter (Signed)
Left message to advise patient ok to reschedule appointment for 6 weeks after starting arava to assess her response.

## 2021-02-10 NOTE — Telephone Encounter (Signed)
Ok to reschedule appointment for 6 weeks after starting arava to assess her response.

## 2021-03-01 DIAGNOSIS — D84821 Immunodeficiency due to drugs: Secondary | ICD-10-CM

## 2021-03-01 DIAGNOSIS — Z79899 Other long term (current) drug therapy: Secondary | ICD-10-CM | POA: Insufficient documentation

## 2021-03-01 DIAGNOSIS — E7800 Pure hypercholesterolemia, unspecified: Secondary | ICD-10-CM | POA: Insufficient documentation

## 2021-03-01 HISTORY — DX: Immunodeficiency due to drugs: D84.821

## 2021-03-01 HISTORY — DX: Pure hypercholesterolemia, unspecified: E78.00

## 2021-03-07 NOTE — Progress Notes (Deleted)
Office Visit Note  Patient: Haley Goodman             Date of Birth: [birth year]           MRN: [id number]             PCP: Leeanne Rio, MD Referring: Leeanne Rio, MD Visit Date: 03/17/2021 Occupation: @GUAROCC @  Subjective:  No chief complaint on file.   History of Present Illness: Haley Goodman is a 41 y.o. female ***   Activities of Daily Living:  Patient reports morning stiffness for *** {minute/hour:19697}.   Patient {ACTIONS;DENIES/REPORTS:21021675::"Denies"} nocturnal pain.  Difficulty dressing/grooming: {ACTIONS;DENIES/REPORTS:21021675::"Denies"} Difficulty climbing stairs: {ACTIONS;DENIES/REPORTS:21021675::"Denies"} Difficulty getting out of chair: {ACTIONS;DENIES/REPORTS:21021675::"Denies"} Difficulty using hands for taps, buttons, cutlery, and/or writing: {ACTIONS;DENIES/REPORTS:21021675::"Denies"}  No Rheumatology ROS completed.   PMFS History:  Patient Active Problem List   Diagnosis Date Noted   MDD (major depressive disorder), recurrent episode, moderate ([place]) 03/22/2017   Encounter for therapeutic drug monitoring 01/11/2017   Rheumatoid arthritis involving multiple sites with positive rheumatoid factor ([place]) 05/10/2016   Fibromyalgia 05/10/2016   Primary osteoarthritis of both hands 05/10/2016   Chronic pain syndrome/ in pain manaement 05/10/2016   High risk medication use/ Plaquenil 05/10/2016   Spondylosis of lumbar region without myelopathy or radiculopathy 05/10/2016   Abnormal serum protein electrophoresis 05/10/2016   Vitamin D deficiency 05/10/2016   Uncontrolled type 2 diabetes mellitus with complication, without long-term current use of insulin 03/13/2016   Essential hypertension, benign 03/13/2016   Morbid obesity ([place]) 03/13/2016   Pulmonary embolism ([place]) 06/06/2011    Class: History of    Past Medical History:  Diagnosis Date   Anemia    Aneurysm ([place])    Arthritis    Fibromyalgia    Hypertension    Lupus  ([place])    PE (pulmonary embolism) 2009   RA (rheumatoid arthritis) ([place])     Family History  Problem Relation Age of Onset   Stroke Mother    Cancer Mother    Cancer Other    Diabetes Other    Cancer Sister    Pseudochol deficiency Neg Hx    Malignant hyperthermia Neg Hx    Hypotension Neg Hx    Anesthesia problems Neg Hx    Past Surgical History:  Procedure Laterality Date   ABLATION     BREAST REDUCTION SURGERY  01/28/2020   Dr. Sandi Mealy at [place]     HYSTEROSCOPY  12/30/2019   Dr. Jeanell Sparrow   TUBAL LIGATION     Social History   Social History Narrative   Not on file   Immunization History  Administered Date(s) Administered   Influenza Split 01/26/2014   Influenza, Seasonal, Injecte, Preservative Fre 12/05/2010, 12/23/2015   Influenza,inj,Quad PF,6+ Mos 12/05/2017   Influenza-Unspecified 03/30/2008, 11/22/2009, 02/11/2013, 12/18/2013, 11/25/2014   Moderna Sars-Covid-2 Vaccination 09/18/2019, 10/16/2019, 03/18/2020   PPD Test 10/03/2017   Pneumococcal Conjugate-13 02/27/2013   Pneumococcal Polysaccharide-23 04/07/2011   Tdap 09/10/2013     Objective: Vital Signs: There were no vitals taken for this visit.   Physical Exam   Musculoskeletal Exam: ***  CDAI Exam: CDAI Score: -- Patient Global: --; Provider Global: -- Swollen: --; Tender: -- Joint Exam 03/17/2021   No joint exam has been documented for this visit   There is currently no information documented on the homunculus. Go to the Rheumatology activity and complete the homunculus joint exam.  Investigation: No  additional findings.  Imaging: No results found.  Recent Labs: Lab Results  Component Value Date   WBC 8.0 12/15/2020   HGB 11.8 12/15/2020   PLT 270 12/15/2020   NA 138 12/15/2020   K 4.4 12/15/2020   CL 105 12/15/2020   CO2 23 12/15/2020   GLUCOSE 108 (H) 12/15/2020   BUN 11 12/15/2020   CREATININE 0.73  12/15/2020   BILITOT 0.3 12/15/2020   ALKPHOS 96 03/12/2009   AST 17 12/15/2020   ALT 12 12/15/2020   PROT 7.7 12/15/2020   ALBUMIN 4.0 03/12/2009   CALCIUM 9.0 12/15/2020   GFRAA 103 06/21/2020    Speciality Comments: PLQ Eye Exam: 01/24/2021 WNL @ [facility] follow up in 1 year  Procedures:  No procedures performed Allergies: Codeine, Lacosamide, Penicillins, Prochlorperazine, Sulfa antibiotics, Cetirizine, Ciprofloxacin, Iodinated contrast media, Lisinopril, Naproxen, Nitrofurantoin, Other, Pregabalin, Compazine, Gabapentin, Lipitor [atorvastatin], Nitrofurantoin monohyd macro, Sulfasalazine, and Dexamethasone   Assessment / Plan:     Visit Diagnoses: No diagnosis found.  Orders: No orders of the defined types were placed in this encounter.  No orders of the defined types were placed in this encounter.   Face-to-face time spent with patient was *** minutes. Greater than 50% of time was spent in counseling and coordination of care.  Follow-Up Instructions: No follow-ups on file.   Earnestine Mealing, CMA  Note - This record has been created using Editor, commissioning.  Chart creation errors have been sought, but may not always  have been located. Such creation errors do not reflect on  the standard of medical care.

## 2021-03-17 ENCOUNTER — Ambulatory Visit: Payer: Medicaid Other | Admitting: Rheumatology

## 2021-03-17 DIAGNOSIS — M19042 Primary osteoarthritis, left hand: Secondary | ICD-10-CM

## 2021-03-17 DIAGNOSIS — R7611 Nonspecific reaction to tuberculin skin test without active tuberculosis: Secondary | ICD-10-CM

## 2021-03-17 DIAGNOSIS — Z79899 Other long term (current) drug therapy: Secondary | ICD-10-CM

## 2021-03-17 DIAGNOSIS — M62838 Other muscle spasm: Secondary | ICD-10-CM

## 2021-03-17 DIAGNOSIS — Z86711 Personal history of pulmonary embolism: Secondary | ICD-10-CM

## 2021-03-17 DIAGNOSIS — Z8639 Personal history of other endocrine, nutritional and metabolic disease: Secondary | ICD-10-CM

## 2021-03-17 DIAGNOSIS — Z8659 Personal history of other mental and behavioral disorders: Secondary | ICD-10-CM

## 2021-03-17 DIAGNOSIS — M797 Fibromyalgia: Secondary | ICD-10-CM

## 2021-03-17 DIAGNOSIS — Z8679 Personal history of other diseases of the circulatory system: Secondary | ICD-10-CM

## 2021-03-17 DIAGNOSIS — J4541 Moderate persistent asthma with (acute) exacerbation: Secondary | ICD-10-CM

## 2021-03-17 DIAGNOSIS — M0579 Rheumatoid arthritis with rheumatoid factor of multiple sites without organ or systems involvement: Secondary | ICD-10-CM

## 2021-03-17 DIAGNOSIS — G8929 Other chronic pain: Secondary | ICD-10-CM

## 2021-03-18 ENCOUNTER — Encounter (HOSPITAL_COMMUNITY): Payer: Self-pay | Admitting: Emergency Medicine

## 2021-03-18 ENCOUNTER — Emergency Department (HOSPITAL_COMMUNITY)
Admission: EM | Admit: 2021-03-18 | Discharge: 2021-03-18 | Disposition: A | Discharge status: Home / Self Care | Payer: 59 | Attending: Emergency Medicine | Admitting: Emergency Medicine

## 2021-03-18 ENCOUNTER — Other Ambulatory Visit: Payer: Self-pay

## 2021-03-18 DIAGNOSIS — G43909 Migraine, unspecified, not intractable, without status migrainosus: Secondary | ICD-10-CM | POA: Diagnosis not present

## 2021-03-18 DIAGNOSIS — R42 Dizziness and giddiness: Secondary | ICD-10-CM | POA: Insufficient documentation

## 2021-03-18 DIAGNOSIS — Z5321 Procedure and treatment not carried out due to patient leaving prior to being seen by health care provider: Secondary | ICD-10-CM | POA: Insufficient documentation

## 2021-03-18 NOTE — ED Triage Notes (Signed)
Pt states she has had a migraine and dizziness x 3 days. Pt with hx of brain aneurysm and is concerned about that.

## 2021-03-22 ENCOUNTER — Encounter: Payer: Self-pay | Admitting: Psychology

## 2021-03-22 ENCOUNTER — Other Ambulatory Visit: Payer: Self-pay

## 2021-03-22 ENCOUNTER — Ambulatory Visit (INDEPENDENT_AMBULATORY_CARE_PROVIDER_SITE_OTHER): Payer: 59 | Admitting: Psychology

## 2021-03-22 ENCOUNTER — Ambulatory Visit: Payer: 59

## 2021-03-22 DIAGNOSIS — M797 Fibromyalgia: Secondary | ICD-10-CM | POA: Diagnosis not present

## 2021-03-22 DIAGNOSIS — G43909 Migraine, unspecified, not intractable, without status migrainosus: Secondary | ICD-10-CM | POA: Insufficient documentation

## 2021-03-22 DIAGNOSIS — M328 Other forms of systemic lupus erythematosus: Secondary | ICD-10-CM | POA: Diagnosis not present

## 2021-03-22 DIAGNOSIS — S065XAA Traumatic subdural hemorrhage with loss of consciousness status unknown, initial encounter: Secondary | ICD-10-CM | POA: Diagnosis not present

## 2021-03-22 DIAGNOSIS — F0670 Mild neurocognitive disorder due to known physiological condition without behavioral disturbance: Secondary | ICD-10-CM | POA: Diagnosis not present

## 2021-03-22 DIAGNOSIS — R531 Weakness: Secondary | ICD-10-CM | POA: Insufficient documentation

## 2021-03-22 DIAGNOSIS — I729 Aneurysm of unspecified site: Secondary | ICD-10-CM | POA: Insufficient documentation

## 2021-03-22 DIAGNOSIS — R4189 Other symptoms and signs involving cognitive functions and awareness: Secondary | ICD-10-CM

## 2021-03-22 DIAGNOSIS — Z8679 Personal history of other diseases of the circulatory system: Secondary | ICD-10-CM | POA: Insufficient documentation

## 2021-03-22 HISTORY — DX: Mild neurocognitive disorder due to known physiological condition without behavioral disturbance: F06.70

## 2021-03-22 NOTE — Progress Notes (Signed)
NEUROPSYCHOLOGICAL EVALUATION [place]. [place] Department of Neurology  Date of Evaluation: March 22, 2021  Reason for Referral:   Haley Goodman is a 41 y.o. 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   Assessment and Plan:   Clinical Impression(s): Haley Goodman pattern of performance is suggestive of weakness surrounding attention/concentration and several facets of executive functioning (e.g., cognitive flexibility, abstract reasoning). An additional, albeit more subtle weakness was seen across phonemic fluency, while performance variability was exhibited across visuospatial abilities. Outside of trouble learning and retrieving information on a daily living memory task (i.e., name, address, phone number, medication instructions), her performance was quite strong across three other more traditional memory tasks. Performance was also largely appropriate across processing speed, response inhibition, receptive language, semantic fluency, and confrontation naming. 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 However, the mild nature of this diagnosis should be emphasized at the present time.  The etiology for ongoing dysfunction is multifactorial in nature. [name] and her husband reported cognitive dysfunction first emerging following her diagnosis of Lupus in 2008. Cognitive dysfunction is commonly experienced with this condition, with attention/concentration and executive functioning being frequently affected  cognitive domains. Additionally, she experienced a ruptured aneurysm and subdural hematoma (SDH) affecting the left convexity in 2019. The type of event and its location would also explain deficits in attention/concentration and executive functioning, as well as right-sided weakness and phonemic fluency (assuming that this right-handed individual is left-hemisphere dominant for language). Overall, the pattern described by her and her husband, including dysfunction after her lupus diagnosis and worsened by her SDH, seems quite reasonable and aligns with obtained test data well.  Across mood-related questionnaires, she did not report acute symptoms of anxiety or depression. Across a more comprehensive personality measure, she elevated a single clinical subscale surrounding somatic complaints. Responses suggest that she has an unusual degree of concern surrounding physical functioning and probable impairment arising from somatic symptoms. Of note, this likely captures concerns surrounding true health conditions (e.g., lupus, fibromyalgia, aneurysm, SDH). She is likely to feel that her health is not as good as her that of her peers, that her health difficulties are quite complicated, and that her health complications are difficult to treat. The extent of somatic preoccupation may negatively influence attentional abilities. She also described likely maladaptive behavior patterns aimed at controlling anxiety symptoms.   Overall, it is most likely that the combination of lupus and her past SDH is primarily responsible for mild cognitive difficulties. These are exacerbated by other factors such as chronic pain, mild psychiatric distress, migraine headaches, various cardiovascular ailments, and medication side effects (e.g., Percocet, Valium, topiramate). She is quite young otherwise and I do not see any concerning patterns of performance or behavior to suggest concern for a neurodegenerative illness at the present time.  Continued medical monitoring will be important moving forward.   Recommendations: Haley Goodman is encouraged to attend to lifestyle factors for brain health (e.g., regular physical exercise, good nutrition habits, regular participation in cognitively-stimulating activities, and general stress management techniques), which are likely to have benefits for both emotional adjustment and cognition. In fact, in addition to promoting good  general health, regular exercise incorporating aerobic activities (e.g., brisk walking, jogging, cycling, etc.) has been demonstrated to be a very effective treatment for depression and stress, with similar efficacy rates to both antidepressant medication and psychotherapy. Optimal control of vascular risk factors (including safe cardiovascular exercise and adherence to dietary recommendations) is encouraged.   Should migraine headaches and their severity persist, she could consider discussing a referral to a neurologist or headache specialist to best treat these symptoms. It should be highlighted that Topamax/topiramate (which currently populates on her medication list) has well known cognitive side effects. She may wish to discuss alternative medications to treat headache symptoms. Should bouts of vertigo increase in frequency, she may wish to be seen by an ENT for an evaluation.   She could also benefit from treatment aimed directly at addressing symptoms of chronic pain. This could include treatment avenues that are not pharmacologically based, such as individual psychotherapy (e.g., Cognitive Behavioral Therapy for Chronic Pain) and regular exercise.  When learning new information, she would benefit from information being broken up into small, manageable pieces. She may also find it helpful to articulate the material in her own words and in a context to promote encoding at the onset of a new task. This material may need to be repeated multiple times to promote  encoding.  Memory can be improved using internal strategies such as rehearsal, repetition, chunking, mnemonics, association, and imagery. External strategies such as written notes in a consistently used memory journal, visual and nonverbal auditory cues such as a calendar on the refrigerator or appointments with alarm, such as on a cell phone, can also help maximize recall.    To address problems with fluctuating attention, she may wish to consider:   -Avoiding external distractions when needing to concentrate   -Limiting exposure to fast paced environments with multiple sensory demands   -Writing down complicated information and using checklists   -Attempting and completing one task at a time (i.e., no multi-tasking)   -Verbalizing aloud each step of a task to maintain focus   -Taking frequent breaks during the completion of steps/tasks to avoid fatigue   -Reducing the amount of information considered at one time  Review of Records:   Ms. Autin was seen in the [facility] medical center on 09/22/2013 with symptoms of sudden onset sharp pain on the left side of the head and right facial droop. She also described "heaviness" and weakness on her right side, as well as a popping sensation in her head and neck. She reported a previously sustained cerebral infarction (I was unable to locate these records). She was stroke alerted in the ED. Initial examination was significant for right lower facial droop, mild R hemiparesis, and diminished RUE temperature sensation. Head CT showed no evidence of acute abnormality and vessel imaging was unremarkable (no arterial or venous abnormality). She was admitted to the stroke service for further diagnostic evaluation. She initially remained NPO due to facial weakness; on later evaluation by speech therapy, she had no sign of aspiration and was cleared for a regular diet. Her INR was supratherapeutic on arrival (3.9) so warfarin was held and no aspirin was given for this  reason. Her antihypertensives were initially held for permissive hypertension. She underwent a brain MRI which showed no acute infarct. She received xanax for MRI and had a transient episode of hypotension because of this; she received 500cc bolus with appropriate response of her BP. On subsequent examinations, there was some effort-dependent weakness noted on her right  side with bursts of 5/5 strength when provided variable resistance. She was evaluated by PT/OT who initially recommended acute rehab but on re-evaluation felt she could go home with home health PT/OT. It was recommended that she follow up in the headache clinic for migraines. There was concern that symptoms could be of functional origin.  She was admitted to the [place] ED on 08/01/2017 after initially presenting to the [facility] ED a few days prior with symptoms of severe headache. She reported she was given IV pain medication and discharged home. She stated the headache never resolved and on 07/31/2017, she was walking to the kitchen and had a sudden loss of consciousness. This was witnessed by her children who described a generalized seizure lasting approximately two minutes. EMS was called and she was transferred to the [facility] ED. Upon arrival, she was complaining of right-sided weakness. Head CT showed a left convexity acute on chronic subdural hematoma. Her INR was noted to be 5.9. Her INR was reversed with Kcentra. Of note, she was recently treated for sinus infection with a course of Keflex then followed by a week of PO Levaquin.   She was transferred to [facility] and admitted to the neuro ICU. Repeat head CT showed bilateral extra-axial fluid collections, left greater than right, with mixed density bilaterally suggestive of acute on chronic subdural hematomas. She had a normal intracranial MR angiography. Neurosurgery was consulted and recommended left burrholes for subdural hematoma evacuation. She was taken to the surgical  arena, the procedure was completed without complications, and she returned to the neuro ICU. She had a repeat head CT postop day #1 that showed interval placement of anterior left parietal burrhole and a subdural drainage catheter with minimal pneumocephalus, improved appearance (decrease) of the left subdural hematoma, especially the low density portion, trace right subdural hematoma also present, and decreased shift of midline structures. Her right-sided weakness improved, her JP drain was discontinued, and she was transferred to the neurology floor for further care. She had a repeat head CT on 08/03/2017 that showed interval removal of left subdural drain with no change in the residual hemorrhage and fluid in the left subdural space. PT/OT were consulted and followed patient during her admission. She complained of left greater than right occipital neuralgia and this was treated with Valium 5 mg every 6 hours with good response. Her hemoglobin A1c was 10.4 and NICS was consulted to assist with medical management. She continued to improve and on hospital day #7 she was deemed medically ready for discharge. Her staples were removed prior to her discharge. She was given discharge instructions and verbalized understanding. She was instructed not to restart her Coumadin until cleared with neurosurgery. She was discharged home with her mother and home health PT/OT.   Ultimately, Ms. Franciosa was referred for a comprehensive neuropsychological evaluation by her PCP Barton Fanny, NP) to characterize her cognitive abilities and to assist with diagnostic clarity and treatment planning.   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 Related to Lupus, better on Plaquenil   Anemia due to acute blood loss 02/15/2009   Arthritis    Bipolar disorder in partial remission 09/10/2013   Hx psychosis/ hallucinations, last in 2012   Cerebral infarction 09/10/2013   Chronic anticoagulation 08/07/2017   Chronic low back pain 06/11/2019   Chronic pain syndrome 05/10/2016   Elevated liver enzymes 03/05/2009   Essential hypertension, benign 03/30/2008   Fibromyalgia    GERD (gastroesophageal reflux disease) 07/25/2019   History of colonic polyps 01/02/2020   Negative colonoscopy performed in November 2021   History of subdural hemorrhage    Hypercholesterolemia 03/01/2021   Hypercoagulable state, secondary 02/15/2009   Immunodeficiency due to drugs 03/01/2021   Major depressive disorder    Migraine headaches    Neck pain 08/24/2020   Osteoarthritis (arthritis due to wear and tear of joints) 04/16/2020   PE (pulmonary embolism) 2009   Pelvic pain in female 04/11/2017   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   S/P cholecystectomy 02/15/2009   Spondylosis of lumbar region without myelopathy or radiculopathy 05/10/2016   Subdural hematoma    Systemic lupus erythematosus 03/30/2008   scalp skin lesions,  on Plaquenil, 2009   Type 2 diabetes mellitus without complication, with long-term current use of insulin 02/27/2014   steroid induced   Upper respiratory tract infection 10/02/2018   Vitamin D deficiency 05/10/2016   Weakness of right side of body     Past Surgical History:  Procedure Laterality Date   ABLATION     BRAIN SURGERY     BREAST REDUCTION SURGERY  01/28/2020   Dr. Sandi Mealy at [facility]     HYSTEROSCOPY  12/30/2019   Dr. Jeanell Sparrow   TUBAL LIGATION      Current Outpatient Medications:    albuterol (VENTOLIN HFA) 108 (90 Base) MCG/ACT inhaler, Inhale 2 puffs into the lungs every 6 (six) hours as needed. , Disp: , Rfl:    ascorbic acid (VITAMIN C) 250 MG CHEW, Chew by mouth., Disp: , Rfl:    aspirin 81 MG tablet, Take 81 mg by mouth 2 (two) times daily., Disp: , Rfl:    Biotin 1 MG CAPS, Take by mouth., Disp: , Rfl:    Blood Glucose Monitoring Suppl (GLUCOCOM BLOOD GLUCOSE MONITOR) DEVI, OneTouch Verio Meter  USE TO CHECK BLOOD SUGARS BID, Disp: , Rfl:    Cholecalciferol (VITAMIN D3) 50000 units CAPS, TAKE 1 CAPSULE BY MOUTH WEEKLY, Disp: , Rfl: 0   cyanocobalamin 1000 MCG tablet, Take by mouth. (Patient not taking: Reported on 12/15/2020), Disp: , Rfl:    cyclobenzaprine (FLEXERIL) 10 MG tablet, Take 10 mg by mouth every 8 (eight) hours as needed., Disp: , Rfl:    diazepam (VALIUM) 5 MG tablet, Take 5 mg by mouth 2 (two) times daily as needed., Disp: , Rfl:    diclofenac (VOLTAREN) 75 MG EC tablet, diclofenac sodium 75 mg tablet,delayed  release  Take 1 tablet twice a day by oral route for 10 days., Disp: , Rfl:    docusate sodium (COLACE) 100 MG capsule, TAKE 1 CAPSULE BY MOUTH TWICE DAILY AS NEEDED FOR CONSTIPATION, Disp: , Rfl:    DULoxetine (CYMBALTA) 60 MG capsule, Take 60 mg by mouth daily as needed. (Patient not taking: Reported on 12/15/2020), Disp: , Rfl:    esomeprazole (NEXIUM) 40 MG capsule, Take  40 mg by mouth daily., Disp: , Rfl: 0   fluticasone furoate-vilanterol (BREO ELLIPTA) 200-25 MCG/INH AEPB, Inhale 1 puff into the lungs daily., Disp: , Rfl:    Garlic 123XX123 MG CAPS, Take by mouth., Disp: , Rfl:    glucose blood (ONETOUCH VERIO) test strip, OneTouch Verio test strips  CHECK SUGARS THREE TIMES A DAY, Disp: , Rfl:    hydrALAZINE (APRESOLINE) 25 MG tablet, Take 25 mg by mouth 2 (two) times daily. , Disp: , Rfl: 0   hydroxychloroquine (PLAQUENIL) 200 MG tablet, TAKE 1 TABLET(200 MG) BY MOUTH TWICE DAILY, Disp: 60 tablet, Rfl: 2   ibuprofen (ADVIL) 800 MG tablet, Take 800 mg by mouth as needed., Disp: , Rfl:    leflunomide (ARAVA) 10 MG tablet, Take one tablet by mouth daily x 2 weeks. If labs are stable increase to 2 tabs by mouth daily., Disp: 60 tablet, Rfl: 0   levocetirizine (XYZAL) 5 MG tablet, TAKE 1 TABLET(5 MG) BY MOUTH EVERY EVENING, Disp: , Rfl:    lidocaine (LIDODERM) 5 %, Apply to the lower back for 12 hours each 24 hour period, Disp: , Rfl:    MELATONIN PO, Take by mouth as needed., Disp: , Rfl:    Multiple Vitamins-Minerals (MULTI VITAMIN/MINERALS) TABS, Take by mouth., Disp: , Rfl:    oxyCODONE-acetaminophen (PERCOCET) 10-325 MG tablet, Take 1 tablet by mouth every 8 (eight) hours as needed for pain., Disp: , Rfl:    promethazine (PHENERGAN) 25 MG tablet, Take 25 mg by mouth every 4 (four) hours as needed. , Disp: , Rfl:    RESTASIS 0.05 % ophthalmic emulsion, 1 drop 2 (two) times daily., Disp: , Rfl:    senna (SENOKOT) 8.6 MG tablet, Take 2 tablets by mouth daily. , Disp: , Rfl:    temazepam (RESTORIL) 15 MG capsule, Take 15 mg by mouth at bedtime. (Patient not taking: Reported on 12/15/2020), Disp: , Rfl:    topiramate (TOPAMAX) 100 MG tablet, Take 100 mg by mouth 2 (two) times daily. , Disp: , Rfl:   Clinical Interview:   The following information was obtained during a clinical interview with Ms. Cush and her husband prior to cognitive testing.  Cognitive  Symptoms: Decreased short-term memory: Endorsed. She described primary difficulties retaining newly learned information after time has passed. She also noted a need to consistently write things down, as well as trouble misplacing items around her residence. Her husband was generally in agreement with this. Decreased long-term memory: Denied. Decreased attention/concentration: Endorsed. Difficulties were said to be increased when fatigued or stressed.  Reduced processing speed: Endorsed. She noted that processing speed seemed "a whole lot" slowed down.  Difficulties with executive functions: Denied. She also denied trouble with impulsivity or any significant personality changes.  Difficulties with emotion regulation: Denied. Difficulties with receptive language: Denied. Difficulties with word finding: Denied. However, she did report the emergence of occasional stuttering behaviors since her 2019 aneurysm and SDH. These were also said to be worse when fatigued.  Decreased visuoperceptual ability: Denied.  Trajectory of deficits: Generally mild deficits  were first observed following her Lupus diagnosis back in 2008. These were likely complicated by an exacerbation of mood dysfunction given diminished functioning and co-occurring diagnoses of rheumatoid arthritis and fibromyalgia. She reportedly experienced a left sided infarct at some point between 2009 and 2015 (I was unable to locate specific records). However, she and her husband denied any notable changes following this event. Cognitive decline was said to notably worsen following her ruptured aneurysm and left sided SDH in 2019. Since her recovery from this event, cognitive abilities have seemed largely stable.   Difficulties completing ADLs: Denied.  Additional Medical History: History of traumatic brain injury/concussion: Denied. History of stroke: Endorsed. Medical records reported a history of an infarct yielding a right facial droop with  occurred sometime between 2008-2015 (some suggest 2009). I was unable to locate specific localizing information or imaging. However, a right facial droop would suggest left hemisphere involvement. Both she and her husband denied notable cognitive decline following this event.  History of seizure activity: Denied. History of known exposure to toxins: Denied. Symptoms of chronic pain: Endorsed. Around the time she was diagnosed with lupus, she reported also being diagnosed with rheumatoid arthritis and fibromyalgia. Since that time, she has reported generally diffuse body pain. Symptom flares were said to be more common in the winter months.  Experience of frequent headaches/migraines: Endorsed. She reported a longstanding history of migraine headaches and has been seen directly due to persisting symptoms in the ED several times (most recently just three days prior to the current evaluation). Symptoms were said to be more severe during flares which seem to occur more often during the winter months.  Frequent instances of dizziness/vertigo: Denied. However, she did report severe vertigo and dizziness associated with her migraine event where she was recently seen in the ED. She also described tinnitus and light sensitivity. She noted that this was the first time she had experienced symptoms quite like that.   Sensory changes: Denied. However, she did acknowledge that her husband has wondered if she has mildly diminished hearing.  Balance/coordination difficulties: Endorsed. She reported a long history of being clumsy. However, stemming from her 2019 aneurysm and SDH, she has reported persisting right-sided weakness, affecting both her upper and lower extremities. She has benefited from PT/OT in the past. She denied any recent falls.  Other motor difficulties: Denied.  Sleep History: Estimated hours obtained each night: 6-8 hours.  Difficulties falling asleep: Endorsed. However, trouble with insomnia has  been notably improved lately with medication intervention and improved sleep hygiene.  Difficulties staying asleep: Denied. Feels rested and refreshed upon awakening: Endorsed.  History of snoring: Endorsed. She reported having a prior sleep study which did not reveal concerning apneic episodes.  History of waking up gasping for air: Denied. However, her husband did note that she will occasionally wake up coughing.  Witnessed breath cessation while asleep: Denied.  History of vivid dreaming: Denied. Excessive movement while asleep: Denied. Instances of acting out her dreams: Denied.  Psychiatric/Behavioral Health History: Depression: She described her current mood as "good." She acknowledged a remote history of depression surrounding poor adjustment to functional limitations when diagnosed with lupus, rheumatoid arthritis, and fibromyalgia back in 2008. She noted that the "bottom fell out" around that time but she has been able to recover well. Current or remote suicidal ideation, intent, or plan was denied.  Anxiety: Endorsed. Symptoms were generalized in nature and generally followed a pattern consistent with depressive symptoms described above.  Mania: Denied. Medical records do  suggest a history of bipolar disorder. However, this has been contested by her mother in her medical records as inaccurate. Her husband did describe days where she would be awake for the majority of the time. However, the context of these instances was unclear.  Trauma History: Denied. Visual/auditory hallucinations: Denied outside of medication side effects. Delusional thoughts: Denied.  Tobacco: Denied. Alcohol: She denied current alcohol consumption and denied a history of problematic alcohol abuse or dependence.  Recreational drugs: Denied.  Family History: Problem Relation Age of Onset   Stroke Mother    Cancer Mother    Cancer Sister    Dementia Maternal Grandmother 70   Cancer Other    Diabetes Other     Dementia Maternal Great-grandmother 22   Pseudochol deficiency Neg Hx    Malignant hyperthermia Neg Hx    Hypotension Neg Hx    Anesthesia problems Neg Hx    This information was confirmed by Ms. Johnson-Kidd.  Academic/Vocational History: Highest level of educational attainment: 14 years. She graduated from high school and estimated completing an additional two years of college. She also reported completing a sleep technician certificate or vocational training course. She described herself as a good (A/B) student in academic settings. Math was noted as a likely relative weakness.  History of developmental delay: Denied. History of grade repetition: Denied. Enrollment in special education courses: Denied. History of LD/ADHD: Denied.  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   Evaluation Results:   Behavioral Observations: Ms. Balasubramanian was accompanied by her husband, arrived to her appointment on time, and was appropriately dressed and groomed. She appeared alert and oriented. Observed gait and station were within normal limits. Gross motor functioning appeared intact upon informal observation and no abnormal movements (e.g., tremors) were noted. Her affect was predominantly flat, but did range appropriately given the subject being discussed during the clinical interview or the task at hand during testing procedures. Spontaneous speech was fluent and word finding difficulties were not observed during the clinical interview. Thought processes were coherent, organized, and normal in content. Insight into her cognitive difficulties appeared adequate.   During testing, she continued to have a largely flat affect. However, she was noted in laughing when being presented with a memory test surrounding learning and remembering a name, address, and phone number, as well as medication instructions. She stated "I can't even  remember my own address." Sustained attention was appropriate. She did appear to have a slow, deliberate approach across many tasks. Task engagement was adequate and she persisted when challenged. There were a few instances where she provided/drew visual information backwards (e.g., Coding, Figure Copy). Overall, Ms. Kunkel was cooperative with the clinical interview and subsequent testing procedures.   Adequacy of Effort: The validity of neuropsychological testing is limited by the extent to which the individual being tested may be assumed to have exerted adequate effort during testing. Ms. Harville expressed her intention to perform to the best of her abilities and exhibited adequate task engagement and persistence. Scores across stand-alone and embedded performance validity measures were within expectation. As such, the results of the current evaluation are believed to be a valid representation of Ms. Teska current cognitive functioning.  Test Results: Ms. Leday was largely oriented at the time of the current evaluation. She was one day off when stating the current day of the week.  Intellectual abilities based upon educational and vocational attainment were estimated to be in the  average range. Premorbid abilities were estimated to be within the well below average range based upon a single-word reading test.   Processing speed was variable but largely appropriate, ranging from the below average to above average normative ranges. Basic attention was below average. More complex attention (e.g., working memory) was well below average. Executive functioning was largely average to well above average. However, a task assessing visuomotor cognitive flexibility did score in the well below average range, as did nonverbal abstract reasoning.  While not directly assessed, receptive language abilities were believed to be intact. Likewise, Ms. Stankovic did not exhibit any  difficulties comprehending task instructions and answered all questions asked of her appropriately. Assessed expressive language was mildly variable. Phonemic fluency was well below average to below average, semantic fluency was average, and confrontation naming was average.     Assessed visuospatial/visuoconstructional abilities were variable, ranging from the well below average to average normative ranges. Points were lost on her drawing of a clock due to mild spatial abnormalities in numerical placement, as well as no size differentiation between clock hands.   Learning (i.e., encoding) of novel verbal and visual information was average to above average outside of a task requiring her to learn a name, address, and phone number, as well as medication instructions. Spontaneous delayed recall (i.e., retrieval) of previously learned information was commensurate with performances across initial learning trials. Retention rates were 100% across a story learning task, 90% across a list learning task, and 88% across a shape learning task. Performance across recognition tasks was commensurate with performances across other aspects of memory testing, suggesting a good degree of evidence for information consolidation.   Results of emotional screening instruments suggested that recent symptoms of generalized anxiety were in the minimal range, while symptoms of depression were within normal limits. Performance across a more comprehensive personality measure elevated a clinical subscale surrounding somatic complaints. A screening instrument assessing recent sleep quality suggested the presence of minimal sleep dysfunction.  Tables of Scores:   Note: This summary of test scores accompanies the interpretive report and should not be considered in isolation without reference to the appropriate sections in the text. Descriptors are based on appropriate normative data and may be adjusted based on clinical judgment. Terms  such as "Within Normal Limits" and "Outside Normal Limits" are used when a more specific description of the test score cannot be determined.       Percentile - Normative Descriptor > 98 - Exceptionally High 91-97 - Well Above Average 75-90 - Above Average 25-74 - Average 9-24 - Below Average 2-8 - Well Below Average < 2 - Exceptionally Low       Validity:   DESCRIPTOR       ACS Word Choice: --- --- Within Normal Limits  Dot Counting Test: --- --- Within Normal Limits  NAB EVI: --- --- Within Normal Limits       Orientation:      Raw Score Percentile   NAB Orientation, Form 1 28/29 --- ---       Cognitive Screening:      Raw Score Percentile   SLUMS: 21/30 --- ---       Intellectual Functioning:      Standard Score Percentile   Test of Premorbid Functioning: 20 5 Well Below Average       Memory:     NAB Memory Module, Form 1: Standard Score/ T Score Percentile   Total Memory Index 90 25 Average  List Learning  Total Trials 1-3 25/36 (47) 38 Average    List B 5/12 (49) 46 Average    Short Delay Free Recall 10/12 (58) 79 Above Average    Long Delay Free Recall 9/12 (52) 58 Average    Retention Percentage 90 (46) 34 Average    Recognition Discriminability 6 (42) 21 Below Average  Shape Learning       Total Trials 1-3 21/27 (58) 79 Above Average    Delayed Recall 7/9 (54) 66 Average    Retention Percentage 88 (45) 31 Average    Recognition Discriminability 9 (60) 84 Above Average  Story Learning       Immediate Recall 60/80 (44) 27 Average    Delayed Recall 32/40 (44) 27 Average    Retention Percentage 100 (55) 69 Average  Daily Living Memory       Immediate Recall 37/51 (35) 7 Well Below Average    Delayed Recall 8/17 (19) <1 Exceptionally Low    Retention Percentage 53 (15) <1 Exceptionally Low    Recognition Hits 7/10 (27) 1 Exceptionally Low       Attention/Executive Function:     Trail Making Test (TMT): Raw Score (T Score) Percentile     Part A 43  secs.,  0 errors (39) 14 Below Average    Part B 129 secs.,  0 errors (34) 5 Well Below Average         Scaled Score Percentile   WAIS-IV Coding: 7 16 Below Average       NAB Attention Module, Form 1: T Score Percentile     Digits Forward 38 12 Below Average    Digits Backwards 35 7 Well Below Average       D-KEFS Color-Word Interference Test: Raw Score (Scaled Score) Percentile     Color Naming 22 secs. (13) 84 Above Average    Word Reading 18 secs. (12) 75 Above Average    Inhibition 38 secs. (14) 53 Well Above Average      Total Errors 2 errors (9) 37 Average    Inhibition/Switching 66 secs. (9) 37 Average      Total Errors 3 errors (9) 37 Average       D-KEFS Verbal Fluency Test: Raw Score (Scaled Score) Percentile     Letter Total Correct 24 (6) 9 Below Average    Category Total Correct 40 (10) 50 Average    Category Switching Total Correct 15 (11) 63 Average    Category Switching Accuracy 13 (11) 63 Average      Total Set Loss Errors 0 (13) 84 Above Average      Total Repetition Errors 0 (12) 75 Above Average       Language:     Verbal Fluency Test: Raw Score (T Score) Percentile     Phonemic Fluency (FAS) 24 (36) 8 Well Below Average    Animal Fluency 21 (56) 73 Average        NAB Language Module, Form 1: T Score Percentile     Naming 30/31 (53) 62 Average       Visuospatial/Visuoconstruction:      Raw Score Percentile   Clock Drawing: 8/10 --- Within Normal Limits       NAB Spatial Module, Form 1: T Score Percentile     Figure Drawing Copy 52 58 Average        Scaled Score Percentile   WAIS-IV Block Design: 5 5 Well Below Average  WAIS-IV Matrix Reasoning: 5 5 Well Below Average  Mood and Personality:      Raw Score Percentile   Beck Depression Inventory - II: 3 --- Within Normal Limits  PROMIS Anxiety Questionnaire: 10 --- None to Slight       Personality Assessment Inventory: T Score  Percentile     Inconsistency 43 --- Within Normal Limits     Infrequency 47 --- Within Normal Limits    Negative Impression 51 --- Within Normal Limits    Positive Impression 66 --- Moderate    Somatic Complaints 71 --- Elevated    Anxiety 40 --- Within Normal Limits    Anxiety-Related Disorders 60 --- Within Normal Limits    Depression 42 --- Within Normal Limits    Mania 52 --- Within Normal Limits    Paranoia 46 --- Within Normal Limits    Schizophrenia 44 --- Within Normal Limits    Borderline Features 39 --- Within Normal Limits    Antisocial Features 39 --- Within Normal Limits    Alcohol Problems 47 --- Within Normal Limits    Drug Problems 50 --- Within Normal Limits    Aggression 37 --- Within Normal Limits    Suicidal Ideation 43 --- Within Normal Limits    Stress 41 --- Within Normal Limits    Non Support 37 --- Within Normal Limits    Treatment Rejection 63 --- Within Normal Limits    Dominance 58 --- Within Normal Limits    Warmth 53 --- Within Normal Limits       Additional Questionnaires:      Raw Score Percentile   PROMIS Sleep Disturbance Questionnaire: 10 --- None to Slight   Informed Consent and Coding/Compliance:   The current evaluation represents a clinical evaluation for the purposes previously outlined by the referral source and is in no way reflective of a forensic evaluation.   Ms. Feider was provided with a verbal description of the nature and purpose of the present neuropsychological evaluation. Also reviewed were the foreseeable risks and/or discomforts and benefits of the procedure, limits of confidentiality, and mandatory reporting requirements of this provider. The patient was given the opportunity to ask questions and receive answers about the evaluation. Oral consent to participate was provided by the patient.   This evaluation was conducted by Christia Reading, Ph.D., ABPP-CN, board certified clinical neuropsychologist. Ms. Pert completed a clinical interview with Dr. Melvyn Novas, billed as one unit  [phone], and 160 minutes of cognitive testing and scoring, billed as one unit [phone] and four additional units 96139. Psychometrist Cruzita Lederer, B.S., assisted Dr. Melvyn Novas with test administration and scoring procedures. As a separate and discrete service, Dr. Melvyn Novas spent a total of 160 minutes in interpretation and report writing billed as one unit [phone] and two units [id number].

## 2021-03-22 NOTE — Progress Notes (Signed)
° °  Psychometrician Note   Cognitive testing was administered to Haley Goodman by Shan Levans, B.S. (psychometrist) under the supervision of Dr. Newman Nickels, Ph.D., licensed psychologist on 03/22/2021. [name] did not appear overtly distressed by the testing session per behavioral observation or responses across self-report questionnaires. Rest breaks were offered.    The battery of tests administered was selected by Dr. Newman Nickels, Ph.D. with consideration to Ms. Goodman's current level of functioning, the nature of her symptoms, emotional and behavioral responses during interview, level of literacy, observed level of motivation/effort, and the nature of the referral question. This battery was communicated to the psychometrist. Communication between Dr. Newman Nickels, Ph.D. and the psychometrist was ongoing throughout the evaluation and Dr. Newman Nickels, Ph.D. was immediately accessible at all times. Dr. Newman Nickels, Ph.D. provided supervision to the psychometrist on the date of this service to the extent necessary to assure the quality of all services provided.    [name] will return within approximately 1-2 weeks for an interactive feedback session with Dr. Milbert Coulter at which time her test performances, clinical impressions, and treatment recommendations will be reviewed in detail. [name] understands she can contact our office should she require our assistance before this time.  A total of 160 minutes of billable time were spent face-to-face with Ms. Lanphear by the psychometrist. This includes both test administration and scoring time. Billing for these services is reflected in the clinical report generated by Dr. Newman Nickels, Ph.D.  This note reflects time spent with the psychometrician and does not include test scores or any clinical interpretations made by Dr. Milbert Coulter. The full report will follow in a separate note.

## 2021-03-23 ENCOUNTER — Encounter: Payer: Self-pay | Admitting: Psychology

## 2021-03-23 DIAGNOSIS — S065XAA Traumatic subdural hemorrhage with loss of consciousness status unknown, initial encounter: Secondary | ICD-10-CM | POA: Insufficient documentation

## 2021-03-25 ENCOUNTER — Other Ambulatory Visit: Payer: Self-pay

## 2021-03-25 ENCOUNTER — Ambulatory Visit (INDEPENDENT_AMBULATORY_CARE_PROVIDER_SITE_OTHER): Payer: 59 | Admitting: Allergy & Immunology

## 2021-03-25 VITALS — BP 134/84 | HR 84 | Temp 98.0°F | Resp 18 | Ht 67.0 in | Wt 222.2 lb

## 2021-03-25 DIAGNOSIS — J3089 Other allergic rhinitis: Secondary | ICD-10-CM

## 2021-03-25 DIAGNOSIS — K219 Gastro-esophageal reflux disease without esophagitis: Secondary | ICD-10-CM | POA: Diagnosis not present

## 2021-03-25 DIAGNOSIS — J302 Other seasonal allergic rhinitis: Secondary | ICD-10-CM

## 2021-03-25 DIAGNOSIS — B999 Unspecified infectious disease: Secondary | ICD-10-CM

## 2021-03-25 DIAGNOSIS — K9049 Malabsorption due to intolerance, not elsewhere classified: Secondary | ICD-10-CM

## 2021-03-25 DIAGNOSIS — J4540 Moderate persistent asthma, uncomplicated: Secondary | ICD-10-CM | POA: Diagnosis not present

## 2021-03-25 MED ORDER — EPINEPHRINE 0.3 MG/0.3ML IJ SOAJ: 0.3000 mg | Freq: Once | INTRAMUSCULAR | 2 refills | Status: AC

## 2021-03-25 MED ORDER — FEXOFENADINE HCL 180 MG PO TABS: 180.0000 mg | ORAL_TABLET | Freq: Every day | ORAL | 5 refills | Status: DC

## 2021-03-25 MED ORDER — ESOMEPRAZOLE MAGNESIUM 40 MG PO CPDR: 40.0000 mg | DELAYED_RELEASE_CAPSULE | Freq: Two times a day (BID) | ORAL | 5 refills | Status: DC

## 2021-03-25 MED ORDER — ALBUTEROL SULFATE HFA 108 (90 BASE) MCG/ACT IN AERS: 2.0000 | INHALATION_SPRAY | Freq: Four times a day (QID) | RESPIRATORY_TRACT | 2 refills | Status: DC | PRN

## 2021-03-25 NOTE — Progress Notes (Signed)
NEW PATIENT  Date of Service/Encounter:  03/25/21  Consult requested by: Jolinda Croak, MD   Assessment:   Seasonal and perennial allergic rhinitis (grasses, ragweed, weeds, trees, outdoor molds, dust mites, cat, dog, and cockroach)  Food intolerance - with testing that is positive to peanuts, wheat, soy, carrots, green pea, and celery (CLINICAL TOLERANCE of celery and carrots)  Gastroesophageal reflux disease  Recurrent infections   Moderate persistent asthma, uncomplicated   Recurrent prednisone use - with multiple complications  Plan/Recommendations:    1. Seasonal and perennial allergic rhinitis - Testing today showed: grasses, ragweed, weeds, trees, outdoor molds, dust mites, cat, dog, and cockroach. - Copy of test results provided.  - Avoidance measures provided. - Stop taking: Claritin - Continue with: Xyzal (levocetirizine) 5mg  tablet once daily - Start taking: Allegra (fexofenadine) 1-2 tablets once daily and Singulair (montelukast) 10mg  daily - You can use an extra dose of the antihistamine, if needed, for breakthrough symptoms.  - Consider nasal saline rinses 1-2 times daily to remove allergens from the nasal cavities as well as help with mucous clearance (this is especially helpful to do before the nasal sprays are given) - Consider allergy shots as a means of long-term control. - Allergy shots "re-train" and "reset" the immune system to ignore environmental allergens and decrease the resulting immune response to those allergens (sneezing, itchy watery eyes, runny nose, nasal congestion, etc).    - Allergy shots improve symptoms in 75-85% of patients.  - We can discuss more at the next appointment if the medications are not working for you.  2. Food intolerance - Food allergy testing unfortunately has a lot of false positives. - If you eat the food without any problems at all, I would not stop eating it even though testing was positive. - However, if you  eat a food develops symptoms to it, I would avoid the food despite what ever the testing showed. - Testing was positive to peanuts, wheat, soy, carrots, green pea, and celery. - I would continue with celery and carrots since you eat these without a problem (these must be FALSE POSITIVES). - I would continue with wheat since you eat paste and bread without a problem (this must be a FALSE POSITIVE as well). - Avoid peanuts and green peas and soy. - You can use liquid aminos.  3. Gastroesophageal reflux disease - Continue with Nexium 40mg  1-2 times daily.   4. Recurrent infections  - We are going to get some additional labs to look at your immune system. - Someone has already done a partial work-up. - We might as well complete it since you are here.   5. Persistent asthma, uncomplicated - Lung testing looks good today. - We are not going to make any changes at this time. - Daily controller medication(s): Breo 200/12mcg one puff once daily - Prior to physical activity: albuterol 2 puffs 10-15 minutes before physical activity. - Rescue medications: albuterol 4 puffs every 4-6 hours as needed and DuoNeb nebulizer one vial every 4-6 hours as needed - Asthma control goals:  * Full participation in all desired activities (may need albuterol before activity) * Albuterol use two time or less a week on average (not counting use with activity) * Cough interfering with sleep two time or less a month * Oral steroids no more than once a year * No hospitalizations  6. Return in about 2 months (around 05/23/2021).   This note in its entirety was forwarded to the Provider who  requested this consultation.  Subjective:   Jasmynne Johnson-Kidd is a 41 y.o. female presenting today for evaluation of  Chief Complaint  Patient presents with   Asthma    Says her asthma is weather induced. Says she developed asthma after Covid.    Rash    Got bit by a bug and caused an allergic reaction. Unsure of the  type of the bug.   Allergic Reaction    She had blood test last month that proved she has has allergies to foods and environmental's. Acid reflux: Citrus foods, Red sauce(anything red)  Apples cause nausea and stomach discomfort. Nuts cause throat itch Pineapple cause mouth and throat to be raw.    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   History of subdural hemorrhage    Migraine headaches    Weakness of right side of body    Ruptured aneurysm of artery    Hypercholesterolemia 03/01/2021   Immunodeficiency due to drugs 03/01/2021   Neck pain 08/24/2020   Osteoarthritis (arthritis due to wear and tear of joints) 04/16/2020   History of colonic polyps 01/02/2020   Allergic rhinitis 07/25/2019   GERD (gastroesophageal reflux disease) 07/25/2019   Chronic low back pain 06/11/2019   Chronic anticoagulation 08/07/2017   Pelvic pain in female 04/11/2017   Major depressive disorder    Fibromyalgia 05/10/2016   Chronic pain syndrome 05/10/2016   High risk medication use/ Plaquenil 05/10/2016   Spondylosis of lumbar region without myelopathy or radiculopathy 05/10/2016   Abnormal serum protein electrophoresis 05/10/2016   Vitamin D deficiency 05/10/2016   Rheumatoid arthritis involving multiple sites with positive rheumatoid factor    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  Millette Johnson-Kidd was referred by Jolinda Croak, MD.     Eveline is a 41 y.o. female presenting for an evaluation of multiple atopic complaints .  She had an allergic reaction to some bites that she got at her patient's house. Bites were all on her legs and up her arms. She had some bites on her face and neck. This was the first time that she had an allergic reaction to bites like that. Both times she ended up in the Urgent Care from the bites. There was a lot of itching and swelling and she broke out over her entire body. She started having itching and swelling of her areas and this would not go away.  She went to see her PCP and she ended up in Urgent Care because she could not breathe.   She apparently had some blood work done that showed sensitizations to ragweed and grass. There was "flea testing" as well as some food allergies. She is not sure why food allergy testing was done.    Asthma/Respiratory Symptom History: She has Breo in combination with albuterol MDI and nebulizer treatments. She  has  prednisone a few times per year. She has had prednisone around twice in the last 12 months. Typically she was "living" on it. She had COVID twice and thisis when her asthma actually appeared.  Allergic Rhinitis Symptom History: She has been on Xyzal in the morning and Claritin at night. She does have Flonase to use as needed.  She has never been allergy tested besides the blood work.   Food Allergy Symptom History: She has GERD from high acid foods. Peanuts make her throat itch. This started  around one year ago. She eats tree nuts without a problem. She drinks almond milk. Regular milk "tears [her] stomach up". She does not eat a lot of cheese. She eats pasta and does fine with it. She has no issues with seafood. She does not like soy milk, but she does not have hives or throat  swelling to it. She avoids soy. She eats carrots and celery. She does not like green peas. She eats soy sauce in her fried ride.   GERD Symptom History: She does have a history of acid reflux. She has noticed that things got worse after she was pregnant. She was having some issues with red sauce as well.  She is has discoid lupus and fibromyalgia and RA. She sees Dr. Estanislado Pandy every five months. Her is on Percocet and Flexaril. She also starting on Plaquenil from Dr. Estanislado Pandy. She is going to be starting a "chemotherapy drug" for her symptoms as well. She is not sure of the name, but this is not MTX per the patient.   Infection Symptom History: She has been on antibiotics around four times in total in the last year. She has not been hospitalized for her infections. She has not needed anything like IV antibiotics at all.   Otherwise, there is no history of other atopic diseases, including stinging insect allergies, eczema, urticaria, or contact dermatitis. There is no significant infectious history. Vaccinations are up to date.    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   History of subdural hemorrhage    Migraine headaches    Weakness of right side of body    Ruptured aneurysm of artery    Hypercholesterolemia 03/01/2021   Immunodeficiency due to drugs 03/01/2021   Neck pain 08/24/2020   Osteoarthritis (arthritis due to wear and tear of joints) 04/16/2020   History of colonic polyps 01/02/2020   Allergic rhinitis 07/25/2019   GERD (gastroesophageal reflux disease) 07/25/2019   Chronic low back pain 06/11/2019   Chronic anticoagulation 08/07/2017   Pelvic pain in female 04/11/2017   Major depressive disorder    Fibromyalgia 05/10/2016   Chronic pain syndrome 05/10/2016   High risk medication use/ Plaquenil 05/10/2016   Spondylosis of lumbar region without  myelopathy or radiculopathy 05/10/2016   Abnormal serum protein electrophoresis 05/10/2016   Vitamin D deficiency 05/10/2016   Rheumatoid arthritis involving multiple sites with positive rheumatoid factor    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   Type 2 diabetes mellitus without complication, with long-term current use of insulin 02/27/2014   Obesity 09/10/2013   Alopecia 09/10/2013   Bipolar disorder in partial remission 09/10/2013   Cerebral infarction 09/10/2013   Pulmonary embolism without acute cor pulmonale ([place]) 03/05/2009   Elevated liver enzymes 03/05/2009   Hypercoagulable state, secondary 02/15/2009   S/P cholecystectomy 02/15/2009   Primary insomnia 04/27/2008   Essential hypertension, benign 03/30/2008  Systemic lupus erythematosus 03/30/2008    Medication List:  Allergies as of 03/25/2021       Reactions   Codeine Anaphylaxis, Swelling   Lacosamide Hives, Rash   Penicillins Hives, Anaphylaxis   Prochlorperazine Itching   Sulfa Antibiotics Anaphylaxis, Swelling   Cetirizine Other (See Comments)   Ciprofloxacin Other (See Comments)   Throat closing sensation   Iodinated Contrast Media Nausea Only, Other (See Comments), Nausea And Vomiting   Other reaction(s): GI intolerance   Lisinopril Swelling   Tongue swelling   Naproxen Other (See Comments)   Nitrofurantoin Hives   Other Other (See Comments)   Pregabalin Other (See Comments)   Compazine    Not in right state of mind.    Gabapentin    Lipitor [atorvastatin]    Flu like symptoms, throat swelling.    Nitrofurantoin Monohyd Macro Hives, Swelling   Sulfasalazine Hives   Dexamethasone Rash        Medication List        Accurate as of March 25, 2021 11:59 PM. If you have any questions, ask your nurse or doctor.          STOP taking these medications    DULoxetine 60 MG capsule Commonly known as: CYMBALTA Stopped by: Valentina Shaggy, MD   temazepam 15 MG capsule Commonly known as:  RESTORIL Stopped by: Valentina Shaggy, MD       TAKE these medications    albuterol 108 (90 Base) MCG/ACT inhaler Commonly known as: VENTOLIN HFA Inhale 2 puffs into the lungs every 6 (six) hours as needed.   ascorbic acid 250 MG Chew Commonly known as: VITAMIN C Chew by mouth.   aspirin 81 MG tablet Take 81 mg by mouth 2 (two) times daily.   betamethasone valerate ointment 0.1 % Commonly known as: VALISONE betamethasone valerate 0.1 % topical ointment  APPLY TOPICALLY TO THE AFFECTED AREA TWICE DAILY. DO NOT APPLY TO FACE   Biotin 1 MG Caps Take by mouth.   Breo Ellipta 200-25 MCG/ACT Aepb Generic drug: fluticasone furoate-vilanterol Inhale 1 puff into the lungs daily.   butalbital-acetaminophen-caffeine 50-325-40 MG tablet Commonly known as: FIORICET Take 1 tablet by mouth every 6 (six) hours as needed.   cyanocobalamin 1000 MCG tablet Take by mouth.   cyclobenzaprine 10 MG tablet Commonly known as: FLEXERIL Take 10 mg by mouth every 8 (eight) hours as needed.   diazepam 5 MG tablet Commonly known as: VALIUM Take 5 mg by mouth 2 (two) times daily as needed.   diclofenac 75 MG EC tablet Commonly known as: VOLTAREN diclofenac sodium 75 mg tablet,delayed release  Take 1 tablet twice a day by oral route for 10 days.   diclofenac 75 MG EC tablet Commonly known as: VOLTAREN Take by mouth.   diphenhydrAMINE 25 MG tablet Commonly known as: SOMINEX Take by mouth.   diphenoxylate-atropine 2.5-0.025 MG tablet Commonly known as: LOMOTIL Take by mouth.   docusate sodium 100 MG capsule Commonly known as: COLACE TAKE 1 CAPSULE BY MOUTH TWICE DAILY AS NEEDED FOR CONSTIPATION   EPINEPHrine 0.3 mg/0.3 mL Soaj injection Commonly known as: EpiPen 2-Pak Inject 0.3 mg into the muscle once for 1 dose. Started by: Valentina Shaggy, MD   esomeprazole 40 MG capsule Commonly known as: NEXIUM Take 1 capsule (40 mg total) by mouth in the morning and at  bedtime. What changed: when to take this Changed by: Valentina Shaggy, MD   fexofenadine 180 MG tablet Commonly known as:  Allegra Allergy Take 1 tablet (180 mg total) by mouth daily. Started by: Valentina Shaggy, MD   Garlic 123XX123 MG Caps Take by mouth.   GlucoCom Blood Glucose Monitor Devi OneTouch Verio Meter  USE TO CHECK BLOOD SUGARS BID   hydrALAZINE 25 MG tablet Commonly known as: APRESOLINE Take 25 mg by mouth 2 (two) times daily.   hydroxychloroquine 200 MG tablet Commonly known as: PLAQUENIL TAKE 1 TABLET(200 MG) BY MOUTH TWICE DAILY   ibuprofen 800 MG tablet Commonly known as: ADVIL Take 800 mg by mouth as needed.   insulin aspart 100 UNIT/ML FlexPen Commonly known as: NOVOLOG Novolog Flexpen U-100 Insulin aspart 100 unit/mL (3 mL) subcutaneous  ADM 5 UNITS [place] TID B MEALS   leflunomide 10 MG tablet Commonly known as: ARAVA Take one tablet by mouth daily x 2 weeks. If labs are stable increase to 2 tabs by mouth daily.   levocetirizine 5 MG tablet Commonly known as: XYZAL TAKE 1 TABLET(5 MG) BY MOUTH EVERY EVENING   lidocaine 5 % Commonly known as: LIDODERM Apply to the lower back for 12 hours each 24 hour period   MELATONIN PO Take by mouth as needed.   Multi Vitamin/Minerals Tabs Take by mouth.   OneTouch Verio test strip Generic drug: glucose blood OneTouch Verio test strips  CHECK SUGARS THREE TIMES A DAY   glucose blood test strip OneTouch Verio test strips   oxyCODONE-acetaminophen 10-325 MG tablet Commonly known as: PERCOCET Take 1 tablet by mouth every 8 (eight) hours as needed for pain.   promethazine 25 MG tablet Commonly known as: PHENERGAN Take 25 mg by mouth every 4 (four) hours as needed.   Restasis 0.05 % ophthalmic emulsion Generic drug: cycloSPORINE 1 drop 2 (two) times daily.   cycloSPORINE 0.05 % ophthalmic emulsion Commonly known as: RESTASIS Place 1 drop into both eyes 2 (two) times daily.   senna 8.6 MG  tablet Commonly known as: SENOKOT Take 2 tablets by mouth daily.   topiramate 100 MG tablet Commonly known as: TOPAMAX Take 100 mg by mouth 2 (two) times daily.   Vitamin D3 1.25 MG (50000 UT) Caps TAKE 1 CAPSULE BY MOUTH WEEKLY        Birth History: non-contributory  Developmental History: non-contributory  Past Surgical History: Past Surgical History:  Procedure Laterality Date   ABLATION     BRAIN SURGERY     BREAST REDUCTION SURGERY  01/28/2020   Dr. Sandi Mealy at [facility]     HYSTEROSCOPY  12/30/2019   Dr. Jeanell Sparrow   TUBAL LIGATION       Family History: Family History  Problem Relation Age of Onset   Stroke Mother    Cancer Mother    Cancer Sister    Dementia Maternal Grandmother 79   Cancer Other    Diabetes Other    Dementia Maternal Great-grandmother 16   Pseudochol deficiency Neg Hx    Malignant hyperthermia Neg Hx    Hypotension Neg Hx    Anesthesia problems Neg Hx      Social History: Tyreona lives at home with her family.  She lives in an apartment that is 43 years old.  There is carpeting throughout the apartment.  She has electric heating and central cooling.  There are no animals inside or outside of the home.  There are no dust mite covers on the bedding.  There is no tobacco exposure.  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  She is not exposed to fumes, chemicals, or dust.  She does not use a HEPA filter in the home.  She does not live near an interstate or industrial area.  There is no tobacco exposure.   Review of Systems  Constitutional: Negative.  Negative for chills, fever, malaise/fatigue and weight loss.  HENT: Negative.  Negative for congestion, ear discharge, ear pain and sinus pain.   Eyes:  Negative for pain, discharge and redness.  Respiratory:  Negative for cough, sputum production, shortness of breath and  wheezing.   Cardiovascular: Negative.  Negative for chest pain and palpitations.  Gastrointestinal:  Negative for abdominal pain, constipation, diarrhea, heartburn, nausea and vomiting.  Skin: Negative.  Negative for itching and rash.  Neurological:  Negative for dizziness and headaches.  Endo/Heme/Allergies:  Negative for environmental allergies. Does not bruise/bleed easily.      Objective:   Blood pressure 134/84, pulse 84, temperature 98 F (36.7 C), temperature source Temporal, resp. rate 18, height 5\' 7"  (1.702 m), weight 222 lb 3.2 oz (100.8 kg), SpO2 100 %. Body mass index is 34.8 kg/m.   Physical Exam:   Physical Exam Vitals reviewed.  Constitutional:      Appearance: She is well-developed.  HENT:     Head: Normocephalic and atraumatic.     Right Ear: Tympanic membrane, ear canal and external ear normal. No drainage, swelling or tenderness. Tympanic membrane is not injected, scarred, erythematous, retracted or bulging.     Left Ear: Tympanic membrane, ear canal and external ear normal. No drainage, swelling or tenderness. Tympanic membrane is not injected, scarred, erythematous, retracted or bulging.     Nose: Mucosal edema and rhinorrhea present. No nasal deformity or septal deviation.     Right Turbinates: Enlarged, swollen and pale.     Left Turbinates: Enlarged, swollen and pale.     Right Sinus: No maxillary sinus tenderness or frontal sinus tenderness.     Left Sinus: No maxillary sinus tenderness or frontal sinus tenderness.     Mouth/Throat:     Mouth: Mucous membranes are not pale and not dry.     Pharynx: Uvula midline.  Eyes:     General:        Right eye: No discharge.        Left eye: No discharge.     Conjunctiva/sclera: Conjunctivae normal.     Right eye: Right conjunctiva is not injected. No chemosis.    Left eye: Left conjunctiva is not injected. No chemosis.    Pupils: Pupils are equal, round, and reactive to light.  Cardiovascular:     Rate and  Rhythm: Normal rate and regular rhythm.     Heart sounds: Normal heart sounds.  Pulmonary:     Effort: Pulmonary effort is normal. No tachypnea, accessory muscle usage or respiratory distress.     Breath sounds: Normal breath sounds. No wheezing, rhonchi or rales.     Comments: Moving air well in all lung fields. Chest:     Chest wall: No tenderness.  Abdominal:     Tenderness: There is no abdominal tenderness. There is no guarding or rebound.  Lymphadenopathy:     Head:     Right side of head: No submandibular, tonsillar or occipital adenopathy.     Left side of head: No submandibular, tonsillar or occipital adenopathy.     Cervical: No cervical adenopathy.  Skin:  General: Skin is warm.     Capillary Refill: Capillary refill takes less than 2 seconds.     Coloration: Skin is not pale.     Findings: No abrasion, erythema, petechiae or rash. Rash is not papular, urticarial or vesicular.     Comments: No eczematous or urticarial lesions noted.  Neurological:     Mental Status: She is alert.  Psychiatric:        Behavior: Behavior is cooperative.     Diagnostic studies:    Spirometry: results abnormal (FEV1: 1.72/61%, FVC: 3.02/88%, FEV1/FVC: 57%).    Spirometry consistent with moderate obstructive disease. Xopenex four puffs via MDI treatment given in clinic with significant improvement in FEV1 and FVC per ATS criteria.  Allergy Studies:     Airborne Adult Perc - 03/25/21 1450     Time Antigen Placed 1450    Allergen Manufacturer Lavella Hammock    Location Back    Number of Test 59    Panel 1 Select    1. Control-Buffer 50% Glycerol Negative    2. Control-Histamine 1 mg/ml 2+    3. Albumin saline Negative    4. Brewer Negative    5. [place] Negative    6. Johnson Negative    7. [place] Blue 3+    8. Meadow Fescue 2+    9. Perennial Rye 4+    10. Sweet Vernal 2+    11. Timothy 2+    12. Cocklebur Negative    13. Burweed Marshelder Negative    14. Ragweed, short Negative     15. Ragweed, Giant Negative    16. Plantain,  English Negative    17. Lamb's Quarters Negative    18. Sheep Sorrell 2+    19. Rough Pigweed Negative    20. Marsh Elder, Rough Negative    21. Mugwort, Common Negative    22. Ash mix 2+    23. Birch mix 2+    24. Beech American 2+    25. Box, Elder Negative    26. Cedar, red Negative    27. Cottonwood, [place] Negative    28. Elm mix Negative    29. Hickory 4+    30. Maple mix Negative    31. Oak, [place] mix 3+    32. Pecan Pollen 4+    33. Pine mix 2+    34. Sycamore Eastern Negative    35. [place], Black Pollen 3+    36. Alternaria alternata 2+    37. Cladosporium Herbarum Negative    38. Aspergillus mix Negative    39. Penicillium mix Negative    40. Bipolaris sorokiniana (Helminthosporium) Negative    41. Drechslera spicifera (Curvularia) Negative    42. Mucor plumbeus Negative    43. Fusarium moniliforme Negative    44. Aureobasidium pullulans (pullulara) Negative    45. Rhizopus oryzae Negative    46. Botrytis cinera Negative    47. Epicoccum nigrum Negative    48. Phoma betae Negative    49. Candida Albicans Negative    50. Trichophyton mentagrophytes Negative    51. Mite, D Farinae  5,000 AU/ml Negative    52. Mite, D Pteronyssinus  5,000 AU/ml Negative    53. Cat Hair 10,000 BAU/ml 4+    54.  Dog Epithelia Negative    55. Mixed Feathers Negative    56. Horse Epithelia Negative    57. Cockroach, German Negative    58. Mouse Negative    59. Tobacco Leaf Negative  Intradermal - 03/25/21 1554     Time Antigen Placed 1554    Allergen Manufacturer Waynette Buttery    Location Arm    Number of Test 10    Intradermal Select             Food Adult Perc - 03/25/21 1400     Time Antigen Placed 1450    Allergen Manufacturer Waynette Buttery    Location Back    Number of allergen test 72    Control-Histamine 1 mg/ml 3+    1. Peanut --   4X3   2. Soybean --   4x3   3. Wheat --   3X3   4. Sesame Negative     5. Milk, cow Negative    6. Egg White, Chicken Negative    7. Casein Negative    8. Shellfish Mix Negative    9. Fish Mix Negative    10. Cashew Negative    11. Pecan Food Negative    12. Walnut Food Negative    13. Almond Negative    14. Hazelnut Negative    15. [place] nut Negative    16. Coconut Negative    17. Pistachio Negative    18. Catfish Negative    19. Bass Negative    20. Trout Negative    21. Tuna Negative    22. Salmon Negative    23. Flounder Negative    24. Codfish Negative    25. Shrimp Negative    26. Crab Negative    27. Lobster Negative    28. Oyster Negative    29. Scallops Negative    30. Barley Negative    31. Oat  Negative    32. Rye  Negative    33. Hops Negative    34. Rice Negative    35. Cottonseed Negative    36. Saccharomyces Cerevisiae  Negative    37. Pork Negative    38. [place] Meat Negative    39. Chicken Meat Negative    40. Beef Negative    41. Lamb Negative    42. Tomato Negative    43. White Potato Negative    44. Sweet Potato Negative    45. Pea, Green/English --   4X3   46. Navy Bean Negative    47. Mushrooms Negative    48. Avocado Negative    49. Onion Negative    50. Cabbage Negative    51. Carrots --   4X4   52. Celery --   3X2   53. Corn Negative    54. Cucumber Negative    55. Grape (White seedless) Negative    56. Orange  Negative    57. Banana Negative    58. Apple Negative    59. Peach Negative    60. Strawberry Negative    61. Cantaloupe Negative    62. Watermelon Negative    63. Pineapple Negative    64. Chocolate/Cacao bean Negative    65. Karaya Gum Negative    66. Acacia (Arabic Gum) Negative    67. Cinnamon Negative    68. Nutmeg Negative    69. Ginger Negative    70. Garlic Negative    71. Pepper, black Negative    72. Mustard Negative         Allergy testing results were read and interpreted by myself, documented by clinical staff.         Malachi Bonds, MD Allergy and Asthma  [facility]

## 2021-03-25 NOTE — Patient Instructions (Addendum)
1. Seasonal and perennial allergic rhinitis - Testing today showed: grasses, ragweed, weeds, trees, outdoor molds, dust mites, cat, dog, and cockroach. - Copy of test results provided.  - Avoidance measures provided. - Stop taking: Claritin - Continue with: Xyzal (levocetirizine) 5mg  tablet once daily - Start taking: Allegra (fexofenadine) 1-2 tablets once daily and Singulair (montelukast) 10mg  daily - You can use an extra dose of the antihistamine, if needed, for breakthrough symptoms.  - Consider nasal saline rinses 1-2 times daily to remove allergens from the nasal cavities as well as help with mucous clearance (this is especially helpful to do before the nasal sprays are given) - Consider allergy shots as a means of long-term control. - Allergy shots "re-train" and "reset" the immune system to ignore environmental allergens and decrease the resulting immune response to those allergens (sneezing, itchy watery eyes, runny nose, nasal congestion, etc).    - Allergy shots improve symptoms in 75-85% of patients.  - We can discuss more at the next appointment if the medications are not working for you.  2. Food intolerance - Food allergy testing unfortunately has a lot of false positives. - If you eat the food without any problems at all, I would not stop eating it even though testing was positive. - However, if you eat a food develops symptoms to it, I would avoid the food despite what ever the testing showed. - Testing was positive to peanuts, wheat, soy, carrots, green pea, and celery. - I would continue with celery and carrots since you eat these without a problem (these must be FALSE POSITIVES). - I would continue with wheat since you eat paste and bread without a problem (this must be a FALSE POSITIVE as well). - Avoid peanuts and green peas and soy. - You can use liquid aminos.  3. Gastroesophageal reflux disease - Continue with Nexium 40mg  1-2 times daily.   4. Recurrent infections   - We are going to get some additional labs to look at your immune system. - Someone has already done a partial work-up. - We might as well complete it since you are here.   5. Persistent asthma, uncomplicated - Lung testing looks good today. - We are not going to make any changes at this time. - Daily controller medication(s): Breo 200/40mcg one puff once daily - Prior to physical activity: albuterol 2 puffs 10-15 minutes before physical activity. - Rescue medications: albuterol 4 puffs every 4-6 hours as needed and DuoNeb nebulizer one vial every 4-6 hours as needed - Asthma control goals:  * Full participation in all desired activities (may need albuterol before activity) * Albuterol use two time or less a week on average (not counting use with activity) * Cough interfering with sleep two time or less a month * Oral steroids no more than once a year * No hospitalizations  6. Return in about 2 months (around 05/23/2021).    Please inform us of any Emergency Department visits, hospitalizations, or changes in symptoms. Call us before going to the ED for breathing or allergy symptoms since we might be able to fit you in for a sick visit. Feel free to contact us anytime with any questions, problems, or concerns.  It was a pleasure to meet you today!  Websites that have reliable patient information: 1. American Academy of Asthma, Allergy, and Immunology: www.aaaai.org 2. Food Allergy Research and Education (FARE): foodallergy.org 3. Mothers of Asthmatics: http://www.asthmacommunitynetwork.org 4. American College of Allergy, Asthma, and Immunology: www.acaai.org   COVID-19  Vaccine Information can be found at: [email or web address] For questions related to vaccine distribution or appointments, please email [email or web address] or call [phone].   We realize that you might be concerned about having an allergic reaction to the  COVID19 vaccines. To help with that concern, WE ARE OFFERING THE COVID19 VACCINES IN OUR OFFICE! Ask the front desk for dates!     Like [place] on [employer] and Instagram for our latest updates!      A healthy democracy works best when [employer] participate! Make sure you are registered to vote! If you have moved or changed any of your contact information, you will need to get this updated before voting!  In some cases, you MAY be able to register to vote online: [email or web address]     Airborne Adult Perc - 03/25/21 1450     Time Antigen Placed 1450    Allergen Manufacturer Lavella Hammock    Location Back    Number of Test 59    Panel 1 Select    1. Control-Buffer 50% Glycerol Negative    2. Control-Histamine 1 mg/ml 2+    3. Albumin saline Negative    4. Salvo Negative    5. [place] Negative    6. Johnson Negative    7. [place] Blue 3+    8. Meadow Fescue 2+    9. Perennial Rye 4+    10. Sweet Vernal 2+    11. Timothy 2+    12. Cocklebur Negative    13. Burweed Marshelder Negative    14. Ragweed, short Negative    15. Ragweed, Giant Negative    16. Plantain,  English Negative    17. Lamb's Quarters Negative    18. Sheep Sorrell 2+    19. Rough Pigweed Negative    20. Marsh Elder, Rough Negative    21. Mugwort, Common Negative    22. Ash mix 2+    23. Birch mix 2+    24. Beech American 2+    25. Box, Elder Negative    26. Cedar, red Negative    27. Cottonwood, [place] Negative    28. Elm mix Negative    29. Hickory 4+    30. Maple mix Negative    31. Oak, [place] mix 3+    32. Pecan Pollen 4+    33. Pine mix 2+    34. Sycamore Eastern Negative    35. Sheridan, Black Pollen 3+    36. Alternaria alternata 2+    37. Cladosporium Herbarum Negative    38. Aspergillus mix Negative    39. Penicillium mix Negative    40. Bipolaris sorokiniana (Helminthosporium) Negative    41. Drechslera spicifera (Curvularia) Negative    42. Mucor plumbeus  Negative    43. Fusarium moniliforme Negative    44. Aureobasidium pullulans (pullulara) Negative    45. Rhizopus oryzae Negative    46. Botrytis cinera Negative    47. Epicoccum nigrum Negative    48. Phoma betae Negative    49. Candida Albicans Negative    50. Trichophyton mentagrophytes Negative    51. Mite, D Farinae  5,000 AU/ml Negative    52. Mite, D Pteronyssinus  5,000 AU/ml Negative    53. Cat Hair 10,000 BAU/ml 4+    54.  Dog Epithelia Negative    55. Mixed Feathers Negative    56. Horse Epithelia Negative    57. Cockroach, German Negative    58. Mouse Negative    59. Tobacco Leaf Negative  Intradermal - 03/25/21 1554     Time Antigen Placed 1554    Allergen Manufacturer Lavella Hammock    Location Arm    Number of Test 10    Intradermal Select             Food Adult Perc - 03/25/21 1400     Time Antigen Placed 1450    Allergen Manufacturer Lavella Hammock    Location Back    Number of allergen test 72    Control-Histamine 1 mg/ml 3+    1. Peanut --   4X3   2. Soybean --   4x3   3. Wheat --   3X3   4. Sesame Negative    5. Milk, cow Negative    6. Egg White, Chicken Negative    7. Casein Negative    8. Shellfish Mix Negative    9. Fish Mix Negative    10. Cashew Negative    11. Pecan Food Negative    12. Mill Spring Negative    13. Almond Negative    14. Hazelnut Negative    15. [place] nut Negative    16. Coconut Negative    17. Pistachio Negative    18. Catfish Negative    19. Bass Negative    20. Trout Negative    21. Tuna Negative    22. Salmon Negative    23. Flounder Negative    24. Codfish Negative    25. Shrimp Negative    26. Crab Negative    27. Lobster Negative    28. Oyster Negative    29. Scallops Negative    30. Barley Negative    31. Oat  Negative    32. Rye  Negative    33. Hops Negative    34. Rice Negative    35. Cottonseed Negative    36. Saccharomyces Cerevisiae  Negative    37. Pork Negative    38. [place] Meat  Negative    39. Chicken Meat Negative    40. Beef Negative    41. Lamb Negative    42. Tomato Negative    43. White Potato Negative    44. Sweet Potato Negative    45. Pea, Green/English --   4X3   46. Navy Bean Negative    47. Mushrooms Negative    48. Avocado Negative    49. Onion Negative    50. Cabbage Negative    51. Carrots --   4X4   52. Celery --   3X2   53. Corn Negative    54. Cucumber Negative    55. Grape (White seedless) Negative    56. Orange  Negative    57. Banana Negative    58. Apple Negative    59. Peach Negative    60. Strawberry Negative    61. Cantaloupe Negative    62. Watermelon Negative    63. Pineapple Negative    64. Chocolate/Cacao bean Negative    65. Karaya Gum Negative    66. Acacia (Arabic Gum) Negative    67. Cinnamon Negative    68. Nutmeg Negative    69. Ginger Negative    70. Garlic Negative    71. Pepper, black Negative    72. Mustard Negative             Reducing Pollen Exposure  The American Academy of Allergy, Asthma and Immunology suggests the following steps to reduce your exposure to pollen during allergy seasons.    Do not  hang sheets or clothing out to dry; pollen may collect on these items. Do not mow lawns or spend time around freshly cut grass; mowing stirs up pollen. Keep windows closed at night.  Keep car windows closed while driving. Minimize morning activities outdoors, a time when pollen counts are usually at their highest. Stay indoors as much as possible when pollen counts or humidity is high and on windy days when pollen tends to remain in the air longer. Use air conditioning when possible.  Many air conditioners have filters that trap the pollen spores. Use a HEPA room air filter to remove pollen form the indoor air you breathe.  Control of Mold Allergen   Mold and fungi can grow on a variety of surfaces provided certain temperature and moisture conditions exist.  Outdoor molds grow on plants, decaying  vegetation and soil.  The major outdoor mold, Alternaria and Cladosporium, are found in very high numbers during hot and dry conditions.  Generally, a late Summer - Fall peak is seen for common outdoor fungal spores.  Rain will temporarily lower outdoor mold spore count, but counts rise rapidly when the rainy period ends.  The most important indoor molds are Aspergillus and Penicillium.  Dark, humid and poorly ventilated basements are ideal sites for mold growth.  The next most common sites of mold growth are the bathroom and the kitchen.  Outdoor (Seasonal) Mold Control  Positive outdoor molds via skin testing: Alternaria  Use air conditioning and keep windows closed Avoid exposure to decaying vegetation. Avoid leaf raking. Avoid grain handling. Consider wearing a face mask if working in moldy areas.     Control of Dust Mite Allergen    Dust mites play a major role in allergic asthma and rhinitis.  They occur in environments with high humidity wherever human skin is found.  Dust mites absorb humidity from the atmosphere (ie, they do not drink) and feed on organic matter (including shed human and animal skin).  Dust mites are a microscopic type of insect that you cannot see with the naked eye.  High levels of dust mites have been detected from mattresses, pillows, carpets, upholstered furniture, bed covers, clothes, soft toys and any woven material.  The principal allergen of the dust mite is found in its feces.  A gram of dust may contain 1,000 mites and 250,000 fecal particles.  Mite antigen is easily measured in the air during house cleaning activities.  Dust mites do not bite and do not cause harm to humans, other than by triggering allergies/asthma.    Ways to decrease your exposure to dust mites in your home:  Encase mattresses, box springs and pillows with a mite-impermeable barrier or cover   Wash sheets, blankets and drapes weekly in hot water (130 F) with detergent and dry them in a  dryer on the hot setting.  Have the room cleaned frequently with a vacuum cleaner and a damp dust-mop.  For carpeting or rugs, vacuuming with a vacuum cleaner equipped with a high-efficiency particulate air (HEPA) filter.  The dust mite allergic individual should not be in a room which is being cleaned and should wait 1 hour after cleaning before going into the room. Do not sleep on upholstered furniture (eg, couches).   If possible removing carpeting, upholstered furniture and drapery from the home is ideal.  Horizontal blinds should be eliminated in the rooms where the person spends the most time (bedroom, study, television room).  Washable vinyl, roller-type shades are optimal. Remove  all non-washable stuffed toys from the bedroom.  Wash stuffed toys weekly like sheets and blankets above.   Reduce indoor humidity to less than 50%.  Inexpensive humidity monitors can be purchased at most hardware stores.  Do not use a humidifier as can make the problem worse and are not recommended.  Control of Dog or Cat Allergen  Avoidance is the best way to manage a dog or cat allergy. If you have a dog or cat and are allergic to dog or cats, consider removing the dog or cat from the home. If you have a dog or cat but dont want to find it a new home, or if your family wants a pet even though someone in the household is allergic, here are some strategies that may help keep symptoms at bay:  Keep the pet out of your bedroom and restrict it to only a few rooms. Be advised that keeping the dog or cat in only one room will not limit the allergens to that room. Dont pet, hug or kiss the dog or cat; if you do, wash your hands with soap and water. High-efficiency particulate air (HEPA) cleaners run continuously in a bedroom or living room can reduce allergen levels over time. Regular use of a high-efficiency vacuum cleaner or a central vacuum can reduce allergen levels. Giving your dog or cat a bath at least once a  week can reduce airborne allergen.  Control of Cockroach Allergen  Cockroach allergen has been identified as an important cause of acute attacks of asthma, especially in urban settings.  There are fifty-five species of cockroach that exist in the [place], however only three, the [place], [place] species produce allergen that can affect patients with Asthma.  Allergens can be obtained from fecal particles, egg casings and secretions from cockroaches.    Remove food sources. Reduce access to water. Seal access and entry points. Spray runways with 0.5-1% Diazinon or Chlorpyrifos Blow boric acid power under stoves and refrigerator. Place bait stations (hydramethylnon) at feeding sites.    Allergy Shots   Allergies are the result of a chain reaction that starts in the immune system. Your immune system controls how your body defends itself. For instance, if you have an allergy to pollen, your immune system identifies pollen as an invader or allergen. Your immune system overreacts by producing antibodies called Immunoglobulin E (IgE). These antibodies travel to cells that release chemicals, causing an allergic reaction.  The concept behind allergy immunotherapy, whether it is received in the form of shots or tablets, is that the immune system can be desensitized to specific allergens that trigger allergy symptoms. Although it requires time and patience, the payback can be long-term relief.  How Do Allergy Shots Work?  Allergy shots work much like a vaccine. Your body responds to injected amounts of a particular allergen given in increasing doses, eventually developing a resistance and tolerance to it. Allergy shots can lead to decreased, minimal or no allergy symptoms.  There generally are two phases: build-up and maintenance. Build-up often ranges from three to six months and involves receiving injections with increasing amounts of the allergens. The shots are typically given  once or twice a week, though more rapid build-up schedules are sometimes used.  The maintenance phase begins when the most effective dose is reached. This dose is different for each person, depending on how allergic you are and your response to the build-up injections. Once the maintenance dose is reached, there are longer periods  between injections, typically two to four weeks.  Occasionally doctors give cortisone-type shots that can temporarily reduce allergy symptoms. These types of shots are different and should not be confused with allergy immunotherapy shots.  Who Can Be Treated with Allergy Shots?  Allergy shots may be a good treatment approach for people with allergic rhinitis (hay fever), allergic asthma, conjunctivitis (eye allergy) or stinging insect allergy.   Before deciding to begin allergy shots, you should consider:   The length of allergy season and the severity of your symptoms  Whether medications and/or changes to your environment can control your symptoms  Your desire to avoid long-term medication use  Time: allergy immunotherapy requires a major time commitment  Cost: may vary depending on your insurance coverage  Allergy shots for children age 51 and older are effective and often well tolerated. They might prevent the onset of new allergen sensitivities or the progression to asthma.  Allergy shots are not started on patients who are pregnant but can be continued on patients who become pregnant while receiving them. In some patients with other medical conditions or who take certain common medications, allergy shots may be of risk. It is important to mention other medications you talk to your allergist.   When Will I Feel Better?  Some may experience decreased allergy symptoms during the build-up phase. For others, it may take as long as 12 months on the maintenance dose. If there is no improvement after a year of maintenance, your allergist will discuss other treatment  options with you.  If you arent responding to allergy shots, it may be because there is not enough dose of the allergen in your vaccine or there are missing allergens that were not identified during your allergy testing. Other reasons could be that there are high levels of the allergen in your environment or major exposure to non-allergic triggers like tobacco smoke.  What Is the Length of Treatment?  Once the maintenance dose is reached, allergy shots are generally continued for three to five years. The decision to stop should be discussed with your allergist at that time. Some people may experience a permanent reduction of allergy symptoms. Others may relapse and a longer course of allergy shots can be considered.  What Are the Possible Reactions?  The two types of adverse reactions that can occur with allergy shots are local and systemic. Common local reactions include very mild redness and swelling at the injection site, which can happen immediately or several hours after. A systemic reaction, which is less common, affects the entire body or a particular body system. They are usually mild and typically respond quickly to medications. Signs include increased allergy symptoms such as sneezing, a stuffy nose or hives.  Rarely, a serious systemic reaction called anaphylaxis can develop. Symptoms include swelling in the throat, wheezing, a feeling of tightness in the chest, nausea or dizziness. Most serious systemic reactions develop within 30 minutes of allergy shots. This is why it is strongly recommended you wait in your doctors office for 30 minutes after your injections. Your allergist is trained to watch for reactions, and his or her staff is trained and equipped with the proper medications to identify and treat them.  Who Should Administer Allergy Shots?  The preferred location for receiving shots is your prescribing allergists office. Injections can sometimes be given at another facility  where the physician and staff are trained to recognize and treat reactions, and have received instructions by your prescribing allergist.

## 2021-03-29 ENCOUNTER — Other Ambulatory Visit: Payer: Self-pay

## 2021-03-29 ENCOUNTER — Encounter: Payer: Self-pay | Admitting: Allergy & Immunology

## 2021-03-29 ENCOUNTER — Ambulatory Visit (INDEPENDENT_AMBULATORY_CARE_PROVIDER_SITE_OTHER): Payer: 59 | Admitting: Psychology

## 2021-03-29 DIAGNOSIS — F0670 Mild neurocognitive disorder due to known physiological condition without behavioral disturbance: Secondary | ICD-10-CM

## 2021-03-29 DIAGNOSIS — I729 Aneurysm of unspecified site: Secondary | ICD-10-CM | POA: Diagnosis not present

## 2021-03-29 DIAGNOSIS — M328 Other forms of systemic lupus erythematosus: Secondary | ICD-10-CM | POA: Diagnosis not present

## 2021-03-29 DIAGNOSIS — S065XAA Traumatic subdural hemorrhage with loss of consciousness status unknown, initial encounter: Secondary | ICD-10-CM | POA: Diagnosis not present

## 2021-03-29 NOTE — Progress Notes (Signed)
° °  Neuropsychology Feedback Session Haley Goodman. [facility]  Reason for Referral:   Haley Goodman is a 41 y.o. 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   Feedback:   [name] completed a comprehensive neuropsychological evaluation on 03/22/2021. Please refer to that encounter for the full report and recommendations. Briefly, results suggested weakness surrounding attention/concentration and several facets of executive functioning (e.g., cognitive flexibility, abstract reasoning). An additional, albeit more subtle weakness was seen across phonemic fluency, while performance variability was exhibited across visuospatial abilities. The etiology for ongoing dysfunction is multifactorial in nature. [name] and her husband reported cognitive dysfunction first emerging following her diagnosis of Lupus in 2008. Cognitive dysfunction is commonly experienced with this condition, with attention/concentration and executive functioning being frequently affected cognitive domains. Additionally, she experienced a ruptured aneurysm and subdural hematoma (SDH) affecting the left convexity in 2019. The type of event and its location would also explain deficits in attention/concentration and executive functioning, as well as right-sided weakness and phonemic fluency (assuming that this right-handed individual is left-hemisphere dominant for language). Overall, the pattern described by her and her husband, including dysfunction after her lupus diagnosis and worsened by her SDH, seems quite reasonable and aligns with obtained test data well.  Ms. August was unaccompanied during  the current telephone call. She was within her residence while I was within my office. I discussed the limitations of evaluation and management by telemedicine and the availability of in person appointments. [name] expressed her understanding and agreed to proceed. Content of the current session focused on the results of her neuropsychological evaluation. [name] was given the opportunity to ask questions and her questions were answered. She was encouraged to reach out should additional questions arise. A copy of her report was mailed at the conclusion of the visit.      17 minutes were spent preparing for, conducting, and documenting the current feedback session with [name], billed as one unit [phone].

## 2021-04-08 ENCOUNTER — Other Ambulatory Visit: Payer: Self-pay | Admitting: Physician Assistant

## 2021-04-08 DIAGNOSIS — M0579 Rheumatoid arthritis with rheumatoid factor of multiple sites without organ or systems involvement: Secondary | ICD-10-CM

## 2021-04-08 NOTE — Telephone Encounter (Signed)
Please schedule patient for a follow up visit. Patient was due December 2022. Thanks!  °

## 2021-04-08 NOTE — Telephone Encounter (Signed)
Next Visit: Due December 2022. Message sent to the front to schedule.   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

## 2021-04-14 NOTE — Progress Notes (Unsigned)
Office Visit Note  Patient: Haley Goodman             Date of Birth: [birth year]           MRN: [id number]             PCP: Jolinda Croak, MD Referring: Leeanne Rio, MD Visit Date: 04/28/2021 Occupation: @GUAROCC @  Subjective:    History of Present Illness: Haley Goodman is a 41 y.o. female with history of seropositive rheumatoid arthritis, osteoarthritis, and fibromyalgia.  Patient is taking Plaquenil 200 mg 1 tablet by mouth twice daily.  Her Reyvow was added at her last office visit on 12/15/2020.  CBC and BMP drawn on 03/19/2021.  CMP drawn on 12/15/2020. PLQ Eye Exam: 01/24/2021 WNL @ [facility] follow up in 1 year.   Activities of Daily Living:  Patient reports morning stiffness for *** {minute/hour:19697}.   Patient {ACTIONS;DENIES/REPORTS:21021675::"Denies"} nocturnal pain.  Difficulty dressing/grooming: {ACTIONS;DENIES/REPORTS:21021675::"Denies"} Difficulty climbing stairs: {ACTIONS;DENIES/REPORTS:21021675::"Denies"} Difficulty getting out of chair: {ACTIONS;DENIES/REPORTS:21021675::"Denies"} Difficulty using hands for taps, buttons, cutlery, and/or writing: {ACTIONS;DENIES/REPORTS:21021675::"Denies"}  No Rheumatology ROS completed.   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/22/2021   History of subdural hemorrhage    Migraine headaches    Weakness of right side of body    Ruptured aneurysm of artery    Hypercholesterolemia 03/01/2021   Immunodeficiency due to drugs 03/01/2021   Neck pain 08/24/2020   Osteoarthritis (arthritis due to wear and tear of joints) 04/16/2020   History of colonic polyps 01/02/2020   Allergic rhinitis 07/25/2019   GERD (gastroesophageal reflux disease) 07/25/2019   Endometritis 07/14/2019   Chronic low back pain 06/11/2019   Dry eyes 09/04/2018   Macromastia 09/04/2018   Degeneration of  lumbar intervertebral disc 09/04/2018   Chronic anticoagulation 08/07/2017   Pelvic pain in female 04/11/2017   Major depressive disorder    Fibromyalgia 05/10/2016   Chronic pain syndrome 05/10/2016   High risk medication use/ Plaquenil 05/10/2016   Spondylosis of lumbar region without myelopathy or radiculopathy 05/10/2016   Abnormal serum protein electrophoresis 05/10/2016   Vitamin D deficiency 05/10/2016   Rheumatoid arthritis involving multiple sites with positive rheumatoid factor    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   Type 2 diabetes mellitus without complication, with long-term current use of insulin 02/27/2014   Obesity 09/10/2013   Alopecia 09/10/2013   Bipolar disorder in partial remission 09/10/2013   Cerebral infarction 09/10/2013   Pulmonary embolism without acute cor pulmonale ([place]) 03/05/2009   Elevated liver enzymes 03/05/2009   Hypercoagulable state, secondary 02/15/2009   S/P cholecystectomy 02/15/2009   Primary insomnia 04/27/2008   Essential hypertension, benign 03/30/2008   Systemic lupus erythematosus 03/30/2008    Past Medical History:  Diagnosis Date   Abnormal serum protein electrophoresis 05/10/2016   Labs from April 23, 2015, SPEP and M-Spike is negative except 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 Related to Lupus, better on Plaquenil   Anemia due to acute blood loss 02/15/2009   Arthritis    Bipolar disorder in  partial remission 09/10/2013   Hx psychosis/ hallucinations, last in 2012   Cerebral infarction 09/10/2013   Chronic anticoagulation 08/07/2017   Chronic low back pain 06/11/2019   Chronic  pain syndrome 05/10/2016   Elevated liver enzymes 03/05/2009   Essential hypertension, benign 03/30/2008   Fibromyalgia    GERD (gastroesophageal reflux disease) 07/25/2019   History of colonic polyps 01/02/2020   Negative colonoscopy performed in November 2021   History of subdural hemorrhage    Hypercholesterolemia 03/01/2021   Hypercoagulable state, secondary 02/15/2009   Immunodeficiency due to drugs 03/01/2021   Major depressive disorder    Migraine headaches    Mild neurocognitive disorder due to multiple etiologies 03/22/2021   Neck pain 08/24/2020   Osteoarthritis (arthritis due to wear and tear of joints) 04/16/2020   PE (pulmonary embolism) 2009   Pelvic pain in female 04/11/2017   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   S/P cholecystectomy 02/15/2009   Spondylosis of lumbar region without myelopathy or radiculopathy 05/10/2016   Subdural hematoma    Systemic lupus erythematosus 03/30/2008   scalp skin lesions, on Plaquenil, 2009   Type 2 diabetes mellitus without complication, with long-term current use of insulin 02/27/2014   steroid induced   Upper respiratory tract infection 10/02/2018   Vitamin D deficiency 05/10/2016   Weakness of right side of body     Family History  Problem Relation Age of Onset   Stroke Mother    Cancer Mother    Cancer Sister    Dementia Maternal Grandmother 83   Cancer Other    Diabetes Other    Dementia Maternal  Great-grandmother 68   Pseudochol deficiency Neg Hx    Malignant hyperthermia Neg Hx    Hypotension Neg Hx    Anesthesia problems Neg Hx    Past Surgical History:  Procedure Laterality Date   ABLATION     BRAIN SURGERY     BREAST REDUCTION SURGERY  01/28/2020   Dr. Sandi Mealy at [place]     HYSTEROSCOPY  12/30/2019   Dr. Jeanell Sparrow   TUBAL LIGATION     Social History   Social History Narrative   Not on file   Immunization History  Administered Date(s) Administered   Influenza Split 01/26/2014   Influenza, Seasonal, Injecte, Preservative Fre 12/05/2010, 12/23/2015   Influenza,inj,Quad PF,6+ Mos 12/05/2017   Influenza-Unspecified 03/30/2008, 11/22/2009, 02/11/2013, 12/18/2013, 11/25/2014   Moderna Sars-Covid-2 Vaccination 09/18/2019, 10/16/2019, 03/18/2020   PPD Test 10/03/2017   Pneumococcal Conjugate-13 02/27/2013   Pneumococcal Polysaccharide-23 04/07/2011   Tdap 09/10/2013     Objective: Vital Signs: There were no vitals taken for this visit.   Physical Exam Vitals and nursing note reviewed.  Constitutional:      Appearance: She is well-developed.  HENT:     Head: Normocephalic and atraumatic.  Eyes:     Conjunctiva/sclera: Conjunctivae normal.  Cardiovascular:     Rate and Rhythm: Normal rate and regular  rhythm.     Heart sounds: Normal heart sounds.  Pulmonary:     Effort: Pulmonary effort is normal.     Breath sounds: Normal breath sounds.  Abdominal:     General: Bowel sounds are normal.     Palpations: Abdomen is soft.  Musculoskeletal:     Cervical back: Normal range of motion.  Lymphadenopathy:     Cervical: No cervical adenopathy.  Skin:    General: Skin is warm and dry.     Capillary Refill: Capillary refill takes less than 2 seconds.  Neurological:     Mental Status: She is alert and oriented to person, place, and time.  Psychiatric:        Behavior: Behavior normal.      Musculoskeletal Exam: ***  CDAI Exam: CDAI Score: -- Patient Global: --; Provider Global: -- Swollen: --; Tender: -- Joint Exam 04/28/2021   No joint exam has been documented for this visit   There is currently no information documented on the homunculus. Go to the Rheumatology activity and complete the homunculus joint exam.  Investigation: No additional findings.  Imaging: No results found.  Recent Labs: Lab Results  Component Value Date   WBC 8.0 12/15/2020   HGB 11.8 12/15/2020   PLT 270 12/15/2020   NA 138 12/15/2020   K 4.4 12/15/2020   CL 105 12/15/2020   CO2 23 12/15/2020   GLUCOSE 108 (H) 12/15/2020   BUN 11 12/15/2020   CREATININE 0.73 12/15/2020   BILITOT 0.3 12/15/2020   ALKPHOS 96 03/12/2009   AST 17 12/15/2020   ALT 12 12/15/2020   PROT 7.7 12/15/2020   ALBUMIN 4.0 03/12/2009   CALCIUM 9.0 12/15/2020   GFRAA 103 06/21/2020    Speciality Comments: PLQ Eye Exam: 01/24/2021 WNL @ [facility] follow up in 1 year  Procedures:  No procedures performed Allergies: Codeine, Lacosamide, Penicillins, Prochlorperazine, Sulfa antibiotics, Cetirizine, Ciprofloxacin, Iodinated contrast media, Lisinopril, Naproxen, Nitrofurantoin, Other, Pregabalin, Compazine, Gabapentin, Lipitor [atorvastatin], Nitrofurantoin monohyd macro, Sulfasalazine, and Dexamethasone   Assessment / Plan:     Visit Diagnoses: No diagnosis found.  Orders: No orders of the defined types were placed in this encounter.  No orders of the defined types were placed in this encounter.   Face-to-face time spent with patient was *** minutes. Greater than 50% of time was spent in counseling and coordination of care.  Follow-Up Instructions: No follow-ups on file.   Earnestine Mealing, CMA  Note - This record has been created using Editor, commissioning.  Chart creation errors have been sought, but may not always  have been located. Such creation errors do not reflect on  the  standard of medical care.

## 2021-04-28 ENCOUNTER — Ambulatory Visit: Payer: Medicaid Other | Admitting: Physician Assistant

## 2021-04-28 DIAGNOSIS — M19041 Primary osteoarthritis, right hand: Secondary | ICD-10-CM

## 2021-04-28 DIAGNOSIS — Z8659 Personal history of other mental and behavioral disorders: Secondary | ICD-10-CM

## 2021-04-28 DIAGNOSIS — Z8639 Personal history of other endocrine, nutritional and metabolic disease: Secondary | ICD-10-CM

## 2021-04-28 DIAGNOSIS — M797 Fibromyalgia: Secondary | ICD-10-CM

## 2021-04-28 DIAGNOSIS — Z79899 Other long term (current) drug therapy: Secondary | ICD-10-CM

## 2021-04-28 DIAGNOSIS — G8929 Other chronic pain: Secondary | ICD-10-CM

## 2021-04-28 DIAGNOSIS — Z8679 Personal history of other diseases of the circulatory system: Secondary | ICD-10-CM

## 2021-04-28 DIAGNOSIS — Z86711 Personal history of pulmonary embolism: Secondary | ICD-10-CM

## 2021-04-28 DIAGNOSIS — J4541 Moderate persistent asthma with (acute) exacerbation: Secondary | ICD-10-CM

## 2021-04-28 DIAGNOSIS — M0579 Rheumatoid arthritis with rheumatoid factor of multiple sites without organ or systems involvement: Secondary | ICD-10-CM

## 2021-04-28 DIAGNOSIS — R7611 Nonspecific reaction to tuberculin skin test without active tuberculosis: Secondary | ICD-10-CM

## 2021-04-28 DIAGNOSIS — M62838 Other muscle spasm: Secondary | ICD-10-CM

## 2021-05-02 NOTE — Progress Notes (Signed)
Office Visit Note  Patient: Haley Goodman             Date of Birth: 25-Jun-1980           MRN: [id number]             PCP: Jolinda Croak, MD Referring: Leeanne Rio, MD Visit Date: 05/04/2021 Occupation: @GUAROCC @  Subjective:  Pain in multiple joints  History of Present Illness: Haley Goodman is a 41 y.o. female with history of seropositive rheumatoid arthritis, fibromyalgia, and osteoarthritis.  She is taking Plaquenil 200 mg 1 tablet by mouth twice daily.  She continues to tolerate Plaquenil without any side effects and has not missed any doses recently.  She did not start on [place] after her last office visit due to the concern for immunosuppression.  She states that in the winter months she experiences recurrent colds.  She currently is having sinus congestion and costochondritis.  She was evaluated in the ED on 04/28/2021 and yesterday on 05/03/2021.  She is planning on following up with her PCP for further evaluation and management.  She does not want to start on any immunosuppressive agents at this time. Patient reports that she has been experiencing recurrent fibromyalgia flares with the cooler weather temperatures.  She is having generalized myalgias and muscle tenderness at this time.  She has been taking Flexeril and Motrin 800 mg daily for symptomatic relief.  She also takes oxycodone for moderate to severe pain relief.  She experiences intermittent pain and swelling in both hands and has been using copper fit gloves.  She denies any other joint pain or joint swelling at this time.   Activities of Daily Living:  Patient reports morning stiffness for 1 hour.   Patient Reports nocturnal pain.  Difficulty dressing/grooming: Reports Difficulty climbing stairs: Reports Difficulty getting out of chair: Reports Difficulty using hands for taps, buttons, cutlery, and/or writing: Reports  Review of Systems  Constitutional:  Positive for fatigue.  HENT:  Positive  for mouth dryness. Negative for mouth sores and nose dryness.   Eyes:  Positive for itching. Negative for pain, visual disturbance and dryness.  Respiratory:  Negative for cough, hemoptysis, shortness of breath and difficulty breathing.   Cardiovascular:  Negative for chest pain, palpitations, hypertension and swelling in legs/feet.  Gastrointestinal:  Negative for blood in stool, constipation and diarrhea.  Endocrine: Negative for increased urination.  Genitourinary:  Negative for difficulty urinating and painful urination.  Musculoskeletal:  Positive for joint pain, joint pain, joint swelling, myalgias, morning stiffness, muscle tenderness and myalgias. Negative for muscle weakness.  Skin:  Negative for color change, pallor, rash, hair loss, nodules/bumps, skin tightness, ulcers and sensitivity to sunlight.  Allergic/Immunologic: Positive for susceptible to infections.  Neurological:  Positive for dizziness and headaches. Negative for numbness, memory loss and weakness.  Hematological:  Positive for bruising/bleeding tendency. Negative for swollen glands.  Psychiatric/Behavioral:  Negative for depressed mood, confusion and sleep disturbance. The patient is not nervous/anxious.    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/22/2021   History of subdural hemorrhage    Migraine headaches    Weakness of right side of body    Ruptured aneurysm of artery    Hypercholesterolemia 03/01/2021   Immunodeficiency due to drugs 03/01/2021   Neck pain 08/24/2020   Osteoarthritis (arthritis due to wear and tear of joints) 04/16/2020   History  of colonic polyps 01/02/2020   Allergic rhinitis 07/25/2019   GERD (gastroesophageal reflux disease) 07/25/2019   Endometritis 07/14/2019   Chronic low back pain 06/11/2019   Dry eyes 09/04/2018   Macromastia 09/04/2018   Degeneration of lumbar  intervertebral disc 09/04/2018   Chronic anticoagulation 08/07/2017   Pelvic pain in female 04/11/2017   Major depressive disorder    Fibromyalgia 05/10/2016   Chronic pain syndrome 05/10/2016   High risk medication use/ Plaquenil 05/10/2016   Spondylosis of lumbar region without myelopathy or radiculopathy 05/10/2016   Abnormal serum protein electrophoresis 05/10/2016   Vitamin D deficiency 05/10/2016   Rheumatoid arthritis involving multiple sites with positive rheumatoid factor    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   Type 2 diabetes mellitus without complication, with long-term current use of insulin 02/27/2014   Obesity 09/10/2013   Alopecia 09/10/2013   Bipolar disorder in partial remission 09/10/2013   Cerebral infarction 09/10/2013   Pulmonary embolism without acute cor pulmonale (HCC) 03/05/2009   Elevated liver enzymes 03/05/2009   Hypercoagulable state, secondary 02/15/2009   S/P cholecystectomy 02/15/2009   Primary insomnia 04/27/2008   Essential hypertension, benign 03/30/2008   Systemic lupus erythematosus 03/30/2008    Past Medical History:  Diagnosis Date   Abnormal serum protein electrophoresis 05/10/2016   Labs from April 23, 2015, SPEP and M-Spike is negative except 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 Related to Lupus, better on Plaquenil   Anemia due to acute blood loss 02/15/2009   Arthritis    Bipolar disorder in partial remission 09/10/2013   Hx psychosis/ hallucinations, last in 2012   Cerebral infarction 09/10/2013   Chronic anticoagulation 08/07/2017   Chronic low back pain 06/11/2019   Chronic pain  syndrome 05/10/2016   Elevated liver enzymes 03/05/2009   Essential hypertension, benign 03/30/2008   Fibromyalgia    GERD (gastroesophageal reflux disease) 07/25/2019   History of colonic polyps 01/02/2020   Negative colonoscopy performed in November 2021   History of subdural hemorrhage    Hypercholesterolemia 03/01/2021   Hypercoagulable state, secondary 02/15/2009   Immunodeficiency due to drugs 03/01/2021   Major depressive disorder    Migraine headaches    Mild neurocognitive disorder due to multiple etiologies 03/22/2021   Neck pain 08/24/2020   Osteoarthritis (arthritis due to wear and tear of joints) 04/16/2020   PE (pulmonary embolism) 2009   Pelvic pain in female 04/11/2017   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   S/P cholecystectomy 02/15/2009   Spondylosis of lumbar region without myelopathy or radiculopathy 05/10/2016   Subdural hematoma    Systemic lupus erythematosus 03/30/2008   scalp skin lesions, on Plaquenil, 2009  Type 2 diabetes mellitus without complication, with long-term current use of insulin 02/27/2014   steroid induced   Upper respiratory tract infection 10/02/2018   Vitamin D deficiency 05/10/2016   Weakness of right side of body     Family History  Problem Relation Age of Onset   Stroke Mother    Cancer Mother    Cancer Sister    Dementia Maternal Grandmother 59   Cancer Other    Diabetes Other    Dementia Maternal Great-grandmother 56    Pseudochol deficiency Neg Hx    Malignant hyperthermia Neg Hx    Hypotension Neg Hx    Anesthesia problems Neg Hx    Past Surgical History:  Procedure Laterality Date   ABLATION     BRAIN SURGERY     BREAST REDUCTION SURGERY  01/28/2020   Dr. Deforest Hoyles at [facility]   CHOLECYSTECTOMY     DILATION AND CURETTAGE OF UTERUS     HYSTEROSCOPY  12/30/2019   Dr. Rosalia Hammers   TUBAL LIGATION     Social History   Social History Narrative   Not on file   Immunization History  Administered Date(s) Administered   Influenza Split 01/26/2014   Influenza, Seasonal, Injecte, Preservative Fre 12/05/2010, 12/23/2015   Influenza,inj,Quad PF,6+ Mos 12/05/2017   Influenza-Unspecified 03/30/2008, 11/22/2009, 02/11/2013, 12/18/2013, 11/25/2014   Moderna Sars-Covid-2 Vaccination 09/18/2019, 10/16/2019, 03/18/2020   PPD Test 10/03/2017   Pneumococcal Conjugate-13 02/27/2013   Pneumococcal Polysaccharide-23 04/07/2011   Tdap 09/10/2013     Objective: Vital Signs: BP (!) 138/91 (BP Location: Left Arm, Patient Position: Sitting, Cuff Size: Large)    Pulse 70    Ht  (1.702 m)    Wt 219 lb 9.6 oz (99.6 kg)    BMI 34.39 kg/m    Physical Exam Vitals and nursing note reviewed.  Constitutional:      Appearance: She is well-developed.  HENT:     Head: Normocephalic and atraumatic.  Eyes:     Conjunctiva/sclera: Conjunctivae normal.  Cardiovascular:     Rate and Rhythm: Normal rate and regular rhythm.     Heart sounds: Normal heart sounds.  Pulmonary:     Effort: Pulmonary effort is normal.     Breath sounds: Normal breath sounds.  Abdominal:     General: Bowel sounds are normal.     Palpations: Abdomen is soft.  Musculoskeletal:     Cervical back: Normal range of motion.  Skin:    General: Skin is warm and dry.     Capillary Refill: Capillary refill takes less than 2 seconds.  Neurological:     Mental Status: She is alert and oriented to person, place, and time.   Psychiatric:        Behavior: Behavior normal.     Musculoskeletal Exam: Generalized hyperalgesia and positive tender points on examination.  C-spine has good range of motion.  Trapezius muscle tension and tenderness bilaterally.  Shoulder joints, elbow joints, wrist joints, MCPs, PIPs, DIPs have good range of motion with no synovitis.  Complete fist formation bilaterally.  Hip joints have good range of motion with no groin pain.  Tenderness ovation over bilateral trochanteric bursa.  Knee joints have good range of motion with no warmth or effusion.  Ankle joints have good range of motion with no tenderness or joint swelling.  No tenderness over MTP joints.  CDAI Exam: CDAI Score: 0.4  Patient Global: 2 mm; Provider Global: 2 mm Swollen: 0 ; Tender: 0  Joint  Exam 05/04/2021   No joint exam has been documented for this visit   There is currently no information documented on the homunculus. Go to the Rheumatology activity and complete the homunculus joint exam.  Investigation: No additional findings.  Imaging: No results found.  Recent Labs: Lab Results  Component Value Date   WBC 8.0 12/15/2020   HGB 11.8 12/15/2020   PLT 270 12/15/2020   NA 138 12/15/2020   K 4.4 12/15/2020   CL 105 12/15/2020   CO2 23 12/15/2020   GLUCOSE 108 (H) 12/15/2020   BUN 11 12/15/2020   CREATININE 0.73 12/15/2020   BILITOT 0.3 12/15/2020   ALKPHOS 96 03/12/2009   AST 17 12/15/2020   ALT 12 12/15/2020   PROT 7.7 12/15/2020   ALBUMIN 4.0 03/12/2009   CALCIUM 9.0 12/15/2020   GFRAA 103 06/21/2020    Speciality Comments: PLQ Eye Exam: 01/24/2021 WNL @ [facility] follow up in 1 year  Procedures:  No procedures performed Allergies: Codeine, Lacosamide, Penicillins, Prochlorperazine, Sulfa antibiotics, Cetirizine, Ciprofloxacin, Iodinated contrast media, Lisinopril, Naproxen, Nitrofurantoin, Other, Pregabalin, Compazine, Gabapentin, Lipitor [atorvastatin], Nitrofurantoin monohyd  macro, Sulfasalazine, and Dexamethasone   Assessment / Plan:     Visit Diagnoses: Rheumatoid arthritis involving multiple sites with positive rheumatoid factor ([place]): She has no synovitis on examination.  She is clinically doing well taking Plaquenil 200 mg 1 tablet by mouth twice daily.  She decided against starting on [place] after her last office visit due to the concern for immunosuppression.  She has had several upper respiratory infections this winter and does not want to proceed with her [place] at this time.  Her rheumatoid arthritis seems well controlled on the current treatment regimen.  No medication changes will be made at this time.  Discussed the importance of joint protection and muscle strengthening.  She will remain on Plaquenil as prescribed.  She was given a list of natural anti-inflammatories to start taking.  She was advised to notify us if she develops signs or symptoms of a flare.  She will follow-up in the office in 3 months.  High risk medication use - Plaquenil 200 mg 1 tablet by mouth twice daily.  PLQ Eye Exam: 01/24/2021 WNL @ [facility] follow up in 1 year.  Patient declined starting [place] after her last office visit October 2022 due to the concern for immunosuppression. BMP and CBC updated on 03/19/21.    Chronic pain of both shoulders: Good range of motion of both shoulder joints with no tenderness upon palpation.  She has trapezius muscle tension and tenderness bilaterally.  Primary osteoarthritis of both hands: She has PIP and DIP thickening consistent with osteoarthritis of both hands.  No tenderness or inflammation was noted on examination today.  She was able to make a complete fist bilaterally. She was given a list of natural anti-inflammatories to start taking.  Fibromyalgia: She has generalized hyperalgesia and positive tender points on examination.  She has been experiencing recurrent fibromyalgia flares during the winter months.  She is currently  having a fibromyalgia flare.  She continues to follow-up with pain management and takes oxycodone every 8 hours as needed for moderate to severe pain relief.  She is also taking Flexeril 10 mg every 8 hours as needed for muscle spasms along with Motrin 800 mg as needed for pain relief. Discussed the importance of regular exercise and good sleep hygiene. I also discussed the importance of stress management.  Trapezius muscle spasm -She has trapezius muscle tension  and tenderness bilaterally.  She experiences muscle spasms intermittently.  She takes Flexeril 10 mg every 8 hours as needed for muscle spasms.  Other medical conditions are listed as follows:   Positive PPD  History of vitamin D deficiency  History of depression  History of pulmonary embolism  History of hypertension  History of psychosis  Moderate persistent asthma with acute exacerbation  Orders: No orders of the defined types were placed in this encounter.  No orders of the defined types were placed in this encounter.   Follow-Up Instructions: Return in 3 months (on 08/04/2021) for Rheumatoid arthritis, Fibromyalgia, Osteoarthritis.   Ofilia Neas, PA-C  Note - This record has been created using Dragon software.  Chart creation errors have been sought, but may not always  have been located. Such creation errors do not reflect on  the standard of medical care.

## 2021-05-04 ENCOUNTER — Encounter: Payer: Self-pay | Admitting: Physician Assistant

## 2021-05-04 ENCOUNTER — Ambulatory Visit (INDEPENDENT_AMBULATORY_CARE_PROVIDER_SITE_OTHER): Payer: 59 | Admitting: Physician Assistant

## 2021-05-04 ENCOUNTER — Other Ambulatory Visit: Payer: Self-pay

## 2021-05-04 VITALS — BP 138/91 | HR 70 | Ht 67.0 in | Wt 219.6 lb

## 2021-05-04 DIAGNOSIS — M0579 Rheumatoid arthritis with rheumatoid factor of multiple sites without organ or systems involvement: Secondary | ICD-10-CM

## 2021-05-04 DIAGNOSIS — G8929 Other chronic pain: Secondary | ICD-10-CM

## 2021-05-04 DIAGNOSIS — Z8659 Personal history of other mental and behavioral disorders: Secondary | ICD-10-CM

## 2021-05-04 DIAGNOSIS — M25511 Pain in right shoulder: Secondary | ICD-10-CM

## 2021-05-04 DIAGNOSIS — M19041 Primary osteoarthritis, right hand: Secondary | ICD-10-CM | POA: Diagnosis not present

## 2021-05-04 DIAGNOSIS — M797 Fibromyalgia: Secondary | ICD-10-CM

## 2021-05-04 DIAGNOSIS — Z8639 Personal history of other endocrine, nutritional and metabolic disease: Secondary | ICD-10-CM

## 2021-05-04 DIAGNOSIS — Z86711 Personal history of pulmonary embolism: Secondary | ICD-10-CM

## 2021-05-04 DIAGNOSIS — R7611 Nonspecific reaction to tuberculin skin test without active tuberculosis: Secondary | ICD-10-CM

## 2021-05-04 DIAGNOSIS — J4541 Moderate persistent asthma with (acute) exacerbation: Secondary | ICD-10-CM

## 2021-05-04 DIAGNOSIS — Z8679 Personal history of other diseases of the circulatory system: Secondary | ICD-10-CM

## 2021-05-04 DIAGNOSIS — M25512 Pain in left shoulder: Secondary | ICD-10-CM

## 2021-05-04 DIAGNOSIS — Z79899 Other long term (current) drug therapy: Secondary | ICD-10-CM

## 2021-05-04 DIAGNOSIS — M62838 Other muscle spasm: Secondary | ICD-10-CM

## 2021-05-04 DIAGNOSIS — M19042 Primary osteoarthritis, left hand: Secondary | ICD-10-CM

## 2021-05-25 ENCOUNTER — Ambulatory Visit: Payer: 59 | Admitting: Allergy & Immunology

## 2021-06-17 ENCOUNTER — Ambulatory Visit: Payer: 59 | Admitting: Family Medicine

## 2021-06-17 NOTE — Patient Instructions (Incomplete)
Asthma ?Continue montelukast 10 mg once a day to prevent cough or wheeze ?Continue Breo 200-1 puff once a day to prevent cough or wheeze ?Continue albuterol 2 puffs once every 4 hours as needed for cough or wheeze ?Continue albuterol 2 puffs 5 to 15 minutes before activity to decrease cough or wheeze ? ?Allergic rhinitis ?Continue allergen avoidance measures directed toward pollen, mold, pets, dust mite, and cockroach as listed below ?Continue Xyzal 5 mg once a day as needed for runny nose or itch ?Continue Allegra 180 mg once a day as needed for runny nose or itch ?Consider saline nasal rinses as needed for nasal symptoms. Use this before any medicated nasal sprays for best result ?Consider allergen immunotherapy if the treatment as listed above is not relieving your symptoms ? ?Reflux ?Continue dietary and lifestyle modifications as listed below ?Continue ease omeprazole 40 mg once a day to control reflux.  Take this medication 30 to 60 minutes before your first meal of the day for best results ? ? ?

## 2021-06-17 NOTE — Progress Notes (Deleted)
   [address] Haley Goodman [place] [address] Dept: [phone]  FOLLOW UP NOTE  Patient ID: Haley Goodman, female    DOB: 1980/11/27  Age: 41 y.o. MRN: [id number] Date of Office Visit: 06/17/2021  Assessment  Chief Complaint: No chief complaint on file.  HPI Leonora Gores is a 41 year old female who presents to the clinic for follow-up visit.  She was last seen in this clinic on 03/25/2021 by Dr. Dellis Anes for evaluation of asthma, allergic rhinitis, recurrent infection, reflux, and food allergy to peanut, soy, pea and possibly wheat, carrot, and celery.   Drug Allergies:  Allergies  Allergen Reactions   Codeine Anaphylaxis and Swelling   Lacosamide Hives and Rash   Penicillins Hives and Anaphylaxis   Prochlorperazine Itching   Sulfa Antibiotics Anaphylaxis and Swelling   Cetirizine Other (See Comments)   Ciprofloxacin Other (See Comments)    Throat closing sensation   Iodinated Contrast Media Nausea Only, Other (See Comments) and Nausea And Vomiting    Other reaction(s): GI intolerance   Lisinopril Swelling    Tongue swelling   Naproxen Other (See Comments)   Nitrofurantoin Hives   Other Other (See Comments)   Pregabalin Other (See Comments)   Compazine     Not in right state of mind.    Gabapentin    Lipitor [Atorvastatin]     Flu like symptoms, throat swelling.    Nitrofurantoin Monohyd Macro Hives and Swelling   Sulfasalazine Hives   Dexamethasone Rash    Physical Exam: There were no vitals taken for this visit.   Physical Exam  Diagnostics:    Assessment and Plan: No diagnosis found.  No orders of the defined types were placed in this encounter.   There are no Patient Instructions on file for this visit.  No follow-ups on file.    Thank you for the opportunity to care for this patient.  Please do not hesitate to contact me with questions.  Thermon Leyland, FNP [facility]

## 2021-06-24 ENCOUNTER — Ambulatory Visit (INDEPENDENT_AMBULATORY_CARE_PROVIDER_SITE_OTHER): Payer: 59 | Admitting: Allergy & Immunology

## 2021-06-24 ENCOUNTER — Encounter: Payer: Self-pay | Admitting: Allergy & Immunology

## 2021-06-24 ENCOUNTER — Other Ambulatory Visit: Payer: Self-pay

## 2021-06-24 VITALS — BP 122/76 | HR 67 | Resp 17

## 2021-06-24 DIAGNOSIS — J3089 Other allergic rhinitis: Secondary | ICD-10-CM

## 2021-06-24 DIAGNOSIS — J4541 Moderate persistent asthma with (acute) exacerbation: Secondary | ICD-10-CM

## 2021-06-24 DIAGNOSIS — K219 Gastro-esophageal reflux disease without esophagitis: Secondary | ICD-10-CM

## 2021-06-24 DIAGNOSIS — J302 Other seasonal allergic rhinitis: Secondary | ICD-10-CM

## 2021-06-24 DIAGNOSIS — K9049 Malabsorption due to intolerance, not elsewhere classified: Secondary | ICD-10-CM | POA: Diagnosis not present

## 2021-06-24 MED ORDER — LEVOCETIRIZINE DIHYDROCHLORIDE 5 MG PO TABS: ORAL_TABLET | ORAL | 5 refills | Status: DC

## 2021-06-24 MED ORDER — METHYLPREDNISOLONE ACETATE 80 MG/ML IJ SUSP
80.0000 mg | Freq: Once | INTRAMUSCULAR | Status: AC
Start: 2021-06-24 — End: 2021-06-24
  Administered 2021-06-24: 80 mg via INTRAMUSCULAR

## 2021-06-24 MED ORDER — TRELEGY ELLIPTA 200-62.5-25 MCG/ACT IN AEPB
1.0000 | INHALATION_SPRAY | Freq: Every day | RESPIRATORY_TRACT | 5 refills | Status: DC
Start: 2021-06-24 — End: 2022-04-07

## 2021-06-24 NOTE — Patient Instructions (Addendum)
1. Seasonal and perennial allergic rhinitis ?- Previous testing showed: grasses, ragweed, weeds, trees, outdoor molds, dust mites, cat, dog, and cockroach. ?- Start taking: Xyzal (levocetirizine) 5mg  tablet once daily ?- Continue taking: Allegra (fexofenadine) 1-2 tablets once daily and Singulair (montelukast) 10mg  daily ?- You can use an extra dose of the antihistamine, if needed, for breakthrough symptoms.  ?- Consider nasal saline rinses 1-2 times daily to remove allergens from the nasal cavities as well as help with mucous clearance (this is especially helpful to do before the nasal sprays are given) ?- Consider allergy shots as a means of long-term control. ?- Allergy shots "re-train" and "reset" the immune system to ignore environmental allergens and decrease the resulting immune response to those allergens (sneezing, itchy watery eyes, runny nose, nasal congestion, etc).    ?- Allergy shots improve symptoms in 75-85% of patients.  ?- Call your insurance company about allergy shots and expected copayments. ?- CPT codes provided. ? ?2. Food intolerance ?- Continue to avoid peanuts and green peas and soy. ?- EpiPen is up to date.  ? ?3. Gastroesophageal reflux disease ?- Continue with Nexium 40mg  1-2 times daily.  ? ?4. Recurrent infections  ?- Immune work up is still pending. ?- We will call you with the results of the testing.  ? ?5. Persistent asthma with acute exacerbation ?- Lung testing looked much lower today, but it did improve with albuterol treatment.  ?- We did administer a DepoMedrol injection today which should remain in the body for 3-5 days.  ?- [facility] (contains three medications to help with your asthma instead of two medications like Breo). ?- We are not going to make any changes at this time. ?- Daily controller medication(s): Trelegy 200/62.5/25 one puff once daily ?- Prior to physical activity: albuterol 2 puffs 10-15 minutes before physical  activity. ?- Rescue medications: albuterol 4 puffs every 4-6 hours as needed and DuoNeb nebulizer one vial every 4-6 hours as needed ?- Asthma control goals:  ?* Full participation in all desired activities (may need albuterol before activity) ?* Albuterol use two time or less a week on average (not counting use with activity) ?* Cough interfering with sleep two time or less a month ?* Oral steroids no more than once a year ?* No hospitalizations ? ?6. Return in about 6 weeks (around 08/05/2021).  ? ? ?Please inform us of any Emergency Department visits, hospitalizations, or changes in symptoms. Call us before going to the ED for breathing or allergy symptoms since we might be able to fit you in for a sick visit. Feel free to contact us anytime with any questions, problems, or concerns. ? ?It was a pleasure to see you again today! ? ?Websites that have reliable patient information: ?1. American Academy of Asthma, Allergy, and Immunology: www.aaaai.org ?2. Food Allergy Research and Education (FARE): foodallergy.org ?3. Mothers of Asthmatics: http://www.asthmacommunitynetwork.org ?4. [employer] of Allergy, Asthma, and Immunology: [email or web address].cawww.[email or web address] ? ? ?COVID-19 Vaccine Information can be found at: [email or web address] For questions related to vaccine distribution or appointments, please email [email or web address] or call [phone].  ? ?We realize that you might be concerned about having an allergic reaction to the COVID19 vaccines. To help with that concern, WE ARE OFFERING THE COVID19 VACCINES IN OUR OFFICE! Ask the front desk for dates!  ? ? ? ??Like? us on Facebook and Instagram for our latest updates!  ?  ? ? ?A healthy democracy works best when [employer] voters  participate! Make sure you are registered to vote! If you have moved or changed any of your contact information, you will need to get this updated before voting! ? ?In some cases, you MAY be able to  register to vote online: [email or web address] ? ? ? ? ? ?Allergy Shots  ? ?Allergies are the result of a chain reaction that starts in the immune system. Your immune system controls how your body defends itself. For instance, if you have an allergy to pollen, your immune system identifies pollen as an invader or allergen. Your immune system overreacts by producing antibodies called Immunoglobulin E (IgE). These antibodies travel to cells that release chemicals, causing an allergic reaction. ? ?The concept behind allergy immunotherapy, whether it is received in the form of shots or tablets, is that the immune system can be desensitized to specific allergens that trigger allergy symptoms. Although it requires time and patience, the payback can be long-term relief. ? ?How Do Allergy Shots Work? ? ?Allergy shots work much like a vaccine. Your body responds to injected amounts of a particular allergen given in increasing doses, eventually developing a resistance and tolerance to it. Allergy shots can lead to decreased, minimal or no allergy symptoms. ? ?There generally are two phases: build-up and maintenance. Build-up often ranges from three to six months and involves receiving injections with increasing amounts of the allergens. The shots are typically given once or twice a week, though more rapid build-up schedules are sometimes used. ? ?The maintenance phase begins when the most effective dose is reached. This dose is different for each person, depending on how allergic you are and your response to the build-up injections. Once the maintenance dose is reached, there are longer periods between injections, typically two to four weeks. ? ?Occasionally doctors give cortisone-type shots that can temporarily reduce allergy symptoms. These types of shots are different and should not be confused with allergy immunotherapy shots. ? ?Who Can Be Treated with Allergy Shots? ? ?Allergy shots may be a  good treatment approach for people with allergic rhinitis (hay fever), allergic asthma, conjunctivitis (eye allergy) or stinging insect allergy.  ? ?Before deciding to begin allergy shots, you should consider: ? ? The length of allergy season and the severity of your symptoms ? Whether medications and/or changes to your environment can control your symptoms ? Your desire to avoid long-term medication use ? Time: allergy immunotherapy requires a major time commitment ? Cost: may vary depending on your insurance coverage ? ?Allergy shots for children age 103 and older are effective and often well tolerated. They might prevent the onset of new allergen sensitivities or the progression to asthma. ? ?Allergy shots are not started on patients who are pregnant but can be continued on patients who become pregnant while receiving them. In some patients with other medical conditions or who take certain common medications, allergy shots may be of risk. It is important to mention other medications you talk to your allergist.  ? ?When Will I Feel Better? ? ?Some may experience decreased allergy symptoms during the build-up phase. For others, it may take as long as 12 months on the maintenance dose. If there is no improvement after a year of maintenance, your allergist will discuss other treatment options with you. ? ?If you aren?t responding to allergy shots, it may be because there is not enough dose of the allergen in your vaccine or there are missing allergens that were not identified during your allergy testing. Other reasons could be that  there are high levels of the allergen in your environment or major exposure to non-allergic triggers like tobacco smoke. ? ?What Is the Length of Treatment? ? ?Once the maintenance dose is reached, allergy shots are generally continued for three to five years. The decision to stop should be discussed with your allergist at that time. Some people may experience a permanent reduction of  allergy symptoms. Others may relapse and a longer course of allergy shots can be considered. ? ?What Are the Possible Reactions? ? ?The two types of adverse reactions that can occur with allergy shots are local and

## 2021-06-24 NOTE — Progress Notes (Signed)
? ?FOLLOW UP ? ?Date of Service/Encounter:  06/24/21 ? ? ?Assessment:  ?  ?Seasonal and perennial allergic rhinitis (grasses, ragweed, weeds, trees, outdoor molds, dust mites, cat, dog, and cockroach) ?  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  ?Gastroesophageal reflux disease ?  ?Recurrent infections  ?  ?Moderate persistent asthma, uncomplicated  ?  ?Recurrent prednisone use - with multiple complications ?  ?Plan/Recommendations:  ? ?1. Seasonal and perennial allergic rhinitis ?- Previous testing showed: grasses, ragweed, weeds, trees, outdoor molds, dust mites, cat, dog, and cockroach. ?- Start taking: Xyzal (levocetirizine)  tablet once daily ?- Continue taking: Allegra (fexofenadine) 1-2 tablets once daily and Singulair (montelukast)  daily ?- You can use an extra dose of the antihistamine, if needed, for breakthrough symptoms.  ?- Consider nasal saline rinses 1-2 times daily to remove allergens from the nasal cavities as well as help with mucous clearance (this is especially helpful to do before the nasal sprays are given) ?- Consider allergy shots as a means of long-term control. ?- Allergy shots "re-train" and "reset" the immune system to ignore environmental allergens and decrease the resulting immune response to those allergens (sneezing, itchy watery eyes, runny nose, nasal congestion, etc).    ?- Allergy shots improve symptoms in 75-85% of patients.  ?- Call your insurance company about allergy shots and expected copayments. ?- CPT codes provided. ? ?2. Food intolerance ?- Continue to avoid peanuts and green peas and soy. ?- EpiPen is up to date.  ? ?3. Gastroesophageal reflux disease ?- Continue with Nexium  1-2 times daily.  ? ?4. Recurrent infections  ?- Immune work up is still pending. ?- We will call you with the results of the testing.  ? ?5. Persistent asthma with acute exacerbation ?- Lung testing looked much lower today, but it did improve with albuterol treatment.  ?-  We did administer a DepoMedrol injection today which should remain in the body for 3-5 days.  ?- We are going to change you from [facility] to Trelegy (contains three medications to help with your asthma instead of two medications like Breo). ?- We are not going to make any changes at this time. ?- Daily controller medication(s): Trelegy 200/62.5/25 one puff once daily ?- Prior to physical activity: albuterol 2 puffs 10-15 minutes before physical activity. ?- Rescue medications: albuterol 4 puffs every 4-6 hours as needed and DuoNeb nebulizer one vial every 4-6 hours as needed ?- Asthma control goals:  ?* Full participation in all desired activities (may need albuterol before activity) ?* Albuterol use two time or less a week on average (not counting use with activity) ?* Cough interfering with sleep two time or less a month ?* Oral steroids no more than once a year ?* No hospitalizations ? ?6. Return in about 6 weeks (around 08/05/2021).  ? ? ? ?Subjective:  ? ?Haley Goodman is a 41 y.o. female presenting today for follow up of  ?Chief Complaint  ?Patient presents with  ? Allergic Rhinitis   ? Asthma  ? ? ?Haley Goodman has a history of the following: ?Patient Active Problem List  ? Diagnosis Date Noted  ? Subdural hematoma (HCC)   ? Mild neurocognitive disorder due to multiple etiologies 03/22/2021  ? History of subdural hemorrhage   ? Migraine headaches   ? Weakness of right side of body   ? Ruptured aneurysm of artery   ? Hypercholesterolemia 03/01/2021  ? Immunodeficiency due to drugs 03/01/2021  ? Neck pain 08/24/2020  ?  Osteoarthritis (arthritis due to wear and tear of joints) 04/16/2020  ? History of colonic polyps 01/02/2020  ? Allergic rhinitis 07/25/2019  ? GERD (gastroesophageal reflux disease) 07/25/2019  ? Endometritis 07/14/2019  ? Chronic low back pain 06/11/2019  ? Dry eyes 09/04/2018  ? Macromastia 09/04/2018  ? Degeneration of lumbar intervertebral disc 09/04/2018  ? Chronic  anticoagulation 08/07/2017  ? Pelvic pain in female 04/11/2017  ? Major depressive disorder   ? Fibromyalgia 05/10/2016  ? Chronic pain syndrome 05/10/2016  ? High risk medication use/ Plaquenil 05/10/2016  ? 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 Abnormal serum protein electrophoresis 05/10/2016  ? Vitamin D deficiency 05/10/2016  ? Rheumatoid arthritis involving multiple sites with positive rheumatoid factor   ? 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 Type 2 diabetes mellitus without complication, with long-term current use of insulin 02/27/2014  ? Obesity 09/10/2013  ? Alopecia 09/10/2013  ? Bipolar disorder in partial remission 09/10/2013  ? Cerebral infarction 09/10/2013  ? Pulmonary embolism without acute cor pulmonale (HCC) 03/05/2009  ? Elevated liver enzymes 03/05/2009  ? Hypercoagulable state, secondary 02/15/2009  ? S/P cholecystectomy 02/15/2009  ? Primary insomnia 04/27/2008  ? Essential hypertension, benign 03/30/2008  ? Systemic lupus erythematosus 03/30/2008  ? ? ?History obtained from: chart review and patient. ? ?Haley Goodman is a 41 y.o. female presenting for a follow up visit.  She was last seen in January 2023.  At that time, she had testing that was positive to multiple indoor and outdoor allergens.  We stopped her Claritin and continued with Xyzal.  We also started Allegra 1 to 2 tablets daily as well as Singulair.  She was endorsing a history of food intolerances.  She had testing that was positive to peanuts, wheat, soy, carrots, green pea, and celery.  However, she told me that she was eating celery and carrots without a problem.  Therefore, I recommended that she continue with this.  She was eating wheat without a problem, so I label that is a false positive as well.  We recommended avoiding peanuts and green peas and soy.  For her GERD, we continue with 40 mg of Nexium 1-2 times daily.  We obtained labs due to history of recurrent infections.  For her  asthma, we continue with Breo 200 mcg 1 puff once daily and albuterol as needed. ? ?Since last visit, she has not done well.   ? ?Asthma/Respiratory Symptom History: She has been doing Breo twice daily for the last two weeks. She was doing fine before two weeks ago. She thinks that the pollen has made things a lot worse.  She was started on Breo two years ago. It had been working up until now. She was never on Trelegy. ? ?Allergic Rhinitis Symptom History: Her allergies have been very flared up. She feels that every time that she opens the door, the "outside attacks [her]".  Her throat has been a mess. She reports that her allergies went "up a notch" after the testing. She has been doing two tablets BID. She is on the montelukast as well. She started Mucinex recently. She feels that she has had a lot of phelgem and constant congestion.  ? ?Food Allergy Symptom History: She continues to avoid peanuts and green peas and soy. She has had no accidental ingestions. She has introduced the other foods that showed up positive on previous testing without adverse event. EpiPen is up to date. ? ?GERD Symptom History: She remains on the Nexium  1-2 times daily. She has not had any flares with regards to her reflux.  ? ?Otherwise, there have been no changes to her past medical history, surgical history, family history, or social history. ? ? ? ?Review of Systems  ?Constitutional: Negative.  Negative for chills, fever, malaise/fatigue and weight loss.  ?HENT: Negative.  Negative for congestion, ear discharge, ear pain and sinus pain.   ?Eyes:  Negative for pain, discharge and redness.  ?Respiratory:  Negative for cough, sputum production, shortness of breath and wheezing.   ?Cardiovascular: Negative.  Negative for chest pain and palpitations.  ?Gastrointestinal:  Negative for abdominal pain, constipation, diarrhea, heartburn, nausea and vomiting.  ?Skin: Negative.  Negative for itching and rash.  ?Neurological:  Negative for  dizziness and headaches.  ?Endo/Heme/Allergies:  Negative for environmental allergies. Does not bruise/bleed easily.   ? ? ? ?Objective:  ? ?Blood pressure 122/76, pulse 67, resp. rate 17, SpO2 97 %. ?There is no height or we

## 2021-06-27 LAB — STREP PNEUMONIAE 23 SEROTYPES IGG
Pneumo Ab Type 1*: 4.9 ug/mL (ref >1.3)
Pneumo Ab Type 12 (12F)*: 0.4 ug/mL — ABNORMAL LOW (ref >1.3)
Pneumo Ab Type 14*: 17.4 ug/mL (ref >1.3)
Pneumo Ab Type 17 (17F)*: 1.5 ug/mL (ref >1.3)
Pneumo Ab Type 19 (19F)*: 7.4 ug/mL (ref >1.3)
Pneumo Ab Type 2*: 3.3 ug/mL (ref >1.3)
Pneumo Ab Type 20*: 6.1 ug/mL (ref >1.3)
Pneumo Ab Type 22 (22F)*: 12.8 ug/mL (ref >1.3)
Pneumo Ab Type 23 (23F)*: 3.8 ug/mL (ref >1.3)
Pneumo Ab Type 26 (6B)*: 0.4 ug/mL — ABNORMAL LOW (ref >1.3)
Pneumo Ab Type 3*: 0.6 ug/mL — ABNORMAL LOW (ref >1.3)
Pneumo Ab Type 34 (10A)*: >19.5 ug/mL (ref >1.3)
Pneumo Ab Type 4*: 0.2 ug/mL — ABNORMAL LOW (ref >1.3)
Pneumo Ab Type 43 (11A)*: >7.6 ug/mL (ref >1.3)
Pneumo Ab Type 5*: 5.3 ug/mL (ref >1.3)
Pneumo Ab Type 51 (7F)*: 0.9 ug/mL — ABNORMAL LOW (ref >1.3)
Pneumo Ab Type 54 (15B)*: 4.7 ug/mL (ref >1.3)
Pneumo Ab Type 56 (18C)*: 0.5 ug/mL — ABNORMAL LOW (ref >1.3)
Pneumo Ab Type 57 (19A)*: 6.1 ug/mL (ref >1.3)
Pneumo Ab Type 68 (9V)*: 1.4 ug/mL (ref >1.3)
Pneumo Ab Type 70 (33F)*: 1.7 ug/mL (ref >1.3)
Pneumo Ab Type 8*: 8.9 ug/mL (ref >1.3)
Pneumo Ab Type 9 (9N)*: 1.1 ug/mL — ABNORMAL LOW (ref >1.3)

## 2021-06-27 LAB — COMPLEMENT, TOTAL: Compl, Total (CH50): >60 U/mL (ref >41)

## 2021-06-27 LAB — IGG 1, 2, 3, AND 4
IgG (Immunoglobin G), Serum: 1013 mg/dL (ref 586–1602)
IgG, Subclass 1: 501 mg/dL (ref 248–810)
IgG, Subclass 2: 378 mg/dL (ref 130–555)
IgG, Subclass 3: 44 mg/dL (ref 15–102)
IgG, Subclass 4: 29 mg/dL (ref 2–96)

## 2021-06-27 LAB — DIPHTHERIA / TETANUS ANTIBODY PANEL
Diphtheria Ab: 0.75 IU/mL (ref <0.10)
Tetanus Ab, IgG: 0.21 IU/mL (ref <0.10)

## 2021-06-27 NOTE — Addendum Note (Signed)
Addended by: Alfonse Spruce on: 06/27/2021 07:28 PM ? ? Modules accepted: Orders ? ?

## 2021-06-29 ENCOUNTER — Telehealth: Payer: Self-pay | Admitting: Allergy & Immunology

## 2021-06-29 NOTE — Telephone Encounter (Signed)
Patient is taking Allegra twice a day, I informed patient to continue to take the allegra twice a day and see if that is enough antihistamine. I did inform patient to call the office in a week or two with an update. Patient verbalized understanding. ?

## 2021-06-29 NOTE — Telephone Encounter (Signed)
Patient states she is unable to get the levocetirizine from pharmacy. Pharmacist told patient that levocetirizine is in the same family as the cetirizine which patient is allergic to; therefore pharmacist did not give patient medication.  ? ?Patient would like to know if there is something else she can take.  ? ?Please advise  ?

## 2021-06-29 NOTE — Telephone Encounter (Signed)
Is she doing Allegra twice a day?  That might be enough antihistamines to cover her symptoms. ? ?Haley Marvel, MD ?[facility] ? ?

## 2021-06-30 NOTE — Telephone Encounter (Signed)
Thank you Haley Goodman!

## 2021-07-18 ENCOUNTER — Other Ambulatory Visit: Payer: Self-pay | Admitting: Rheumatology

## 2021-07-18 DIAGNOSIS — M0579 Rheumatoid arthritis with rheumatoid factor of multiple sites without organ or systems involvement: Secondary | ICD-10-CM

## 2021-07-18 MED ORDER — FEXOFENADINE HCL 180 MG PO TABS: 180.0000 mg | ORAL_TABLET | Freq: Two times a day (BID) | ORAL | 1 refills | Status: DC | PRN

## 2021-07-18 NOTE — Telephone Encounter (Signed)
Next Visit: 09/16/2021  Last Visit: 05/04/2021  Labs: 05/03/2021 glucose 105, albumin/globulin ratio 1.0, WBC 11.6, MCH 26.6, neutrophil% 48.9, IG absolute 0.040  Eye exam: 01/24/2021   Current Dose per office note on 05/04/2021: Plaquenil 200 mg 1 tablet by mouth twice daily.   DX: Rheumatoid arthritis involving multiple sites with positive rheumatoid factor   Last Fill: 04/08/2021  Okay to refill Plaquenil?

## 2021-07-18 NOTE — Telephone Encounter (Signed)
Sorry wrong pool

## 2021-07-18 NOTE — Addendum Note (Signed)
Addended by: Areta Haber B on: 07/18/2021 05:35 PM   Modules accepted: Orders

## 2021-07-18 NOTE — Telephone Encounter (Signed)
Patient called back stating she thought we were going to send the Allergra in to take twice a day.  [facility] [employer]

## 2021-08-12 ENCOUNTER — Ambulatory Visit: Payer: 59 | Admitting: Family Medicine

## 2021-09-02 NOTE — Progress Notes (Deleted)
Office Visit Note  Patient: Haley Goodman             Date of Birth: December 16, 1980           MRN: [id number]             PCP: Stevphen Rochester, MD Referring: Stevphen Rochester, MD Visit Date: 09/16/2021 Occupation: @GUAROCC @  Subjective:  No chief complaint on file.   History of Present Illness: Haley Goodman is a 41 y.o. female ***   Activities of Daily Living:  Patient reports morning stiffness for *** {minute/hour:19697}.   Patient {ACTIONS;DENIES/REPORTS:21021675::"Denies"} nocturnal pain.  Difficulty dressing/grooming: {ACTIONS;DENIES/REPORTS:21021675::"Denies"} Difficulty climbing stairs: {ACTIONS;DENIES/REPORTS:21021675::"Denies"} Difficulty getting out of chair: {ACTIONS;DENIES/REPORTS:21021675::"Denies"} Difficulty using hands for taps, buttons, cutlery, and/or writing: {ACTIONS;DENIES/REPORTS:21021675::"Denies"}  No Rheumatology ROS completed.   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   History of subdural hemorrhage    Migraine headaches    Weakness of right side of body    Ruptured aneurysm of artery    Hypercholesterolemia 03/01/2021   Immunodeficiency due to drugs 03/01/2021   Neck pain 08/24/2020   Osteoarthritis (arthritis due to wear and tear of joints) 04/16/2020   History of colonic polyps 01/02/2020   Allergic rhinitis 07/25/2019   GERD (gastroesophageal reflux disease) 07/25/2019   Endometritis 07/14/2019   Chronic low back pain 06/11/2019   Dry eyes 09/04/2018   Macromastia 09/04/2018   Degeneration of lumbar intervertebral disc 09/04/2018   Chronic anticoagulation 08/07/2017   Pelvic pain in female 04/11/2017   Major depressive disorder    Fibromyalgia 05/10/2016   Chronic pain syndrome 05/10/2016   High risk medication use/ Plaquenil 05/10/2016   Spondylosis of lumbar region without myelopathy or  radiculopathy 05/10/2016   Abnormal serum protein electrophoresis 05/10/2016   Vitamin D deficiency 05/10/2016   Rheumatoid arthritis involving multiple sites with positive rheumatoid factor    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   Type 2 diabetes mellitus without complication, with long-term current use of insulin 02/27/2014   Obesity 09/10/2013   Alopecia 09/10/2013   Bipolar disorder in partial remission 09/10/2013   Cerebral infarction 09/10/2013   Pulmonary embolism without acute cor pulmonale (HCC) 03/05/2009   Elevated liver enzymes 03/05/2009   Hypercoagulable state, secondary 02/15/2009   S/P cholecystectomy 02/15/2009   Primary insomnia 04/27/2008   Essential hypertension, benign 03/30/2008   Systemic lupus erythematosus 03/30/2008    Past Medical History:  Diagnosis Date   Abnormal serum protein electrophoresis 05/10/2016   Labs from April 23, 2015, SPEP and M-Spike is negative except 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 Related to Lupus, better on Plaquenil   Anemia due to acute blood loss 02/15/2009   Arthritis    Bipolar disorder in partial remission 09/10/2013   Hx psychosis/ hallucinations, last in 2012   Cerebral infarction 09/10/2013   Chronic anticoagulation 08/07/2017   Chronic low back pain 06/11/2019   Chronic pain syndrome 05/10/2016   Elevated liver enzymes 03/05/2009   Essential hypertension, benign 03/30/2008   Fibromyalgia    GERD (  gastroesophageal reflux disease) 07/25/2019   History of colonic polyps 01/02/2020   Negative colonoscopy performed in November 2021   History of subdural hemorrhage     Hypercholesterolemia 03/01/2021   Hypercoagulable state, secondary 02/15/2009   Immunodeficiency due to drugs 03/01/2021   Major depressive disorder    Migraine headaches    Mild neurocognitive disorder due to multiple etiologies 03/22/2021   Neck pain 08/24/2020   Osteoarthritis (arthritis due to wear and tear of joints) 04/16/2020   PE (pulmonary embolism) 2009   Pelvic pain in female 04/11/2017   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   S/P cholecystectomy 02/15/2009   Spondylosis of lumbar region without myelopathy or radiculopathy 05/10/2016   Subdural hematoma (HCC)    Systemic lupus erythematosus 03/30/2008   scalp skin lesions, on Plaquenil, 2009   Type 2 diabetes mellitus without complication, with long-term current use of insulin 02/27/2014   steroid induced   Upper respiratory tract infection 10/02/2018   Vitamin D deficiency 05/10/2016   Weakness of right side of body     Family History  Problem Relation Age of Onset   Stroke Mother    Cancer Mother    Cancer Sister    Dementia Maternal Grandmother 72   Cancer Other    Diabetes Other    Dementia Maternal Great-grandmother 87   Pseudochol deficiency Neg Hx    Malignant hyperthermia Neg Hx    Hypotension Neg Hx    Anesthesia problems Neg Hx    Past Surgical History:  Procedure Laterality Date   ABLATION     BRAIN SURGERY     BREAST REDUCTION SURGERY  01/28/2020   Dr. Deforest Hoyles at [facility]   CHOLECYSTECTOMY     DILATION AND CURETTAGE OF UTERUS     HYSTEROSCOPY  12/30/2019   Dr. Rosalia Hammers   TUBAL LIGATION     Social History   Social History Narrative   Not on file   Immunization History  Administered Date(s) Administered   Influenza Split 01/26/2014   Influenza, Seasonal, Injecte, Preservative Fre 12/05/2010, 12/23/2015   Influenza,inj,Quad PF,6+ Mos 12/05/2017   Influenza-Unspecified 03/30/2008, 11/22/2009, 02/11/2013, 12/18/2013, 11/25/2014   Moderna Sars-Covid-2 Vaccination 09/18/2019, 10/16/2019, 03/18/2020   PPD Test 10/03/2017   Pneumococcal Conjugate-13 02/27/2013   Pneumococcal Polysaccharide-23 04/07/2011   Tdap 09/10/2013     Objective: Vital Signs: There were no vitals taken for this visit.   Physical Exam   Musculoskeletal Exam: ***  CDAI Exam: CDAI Score: -- Patient Global: --; Provider Global: -- Swollen: --; Tender: -- Joint Exam 09/16/2021   No joint exam has been documented for this visit   There is currently no information documented on the homunculus. Go to the Rheumatology activity and complete the homunculus joint exam.  Investigation: No additional findings.  Imaging: No results found.  Recent Labs: Lab Results  Component Value Date   WBC 8.0 12/15/2020   HGB 11.8 12/15/2020   PLT 270 12/15/2020   NA 138 12/15/2020   K 4.4  12/15/2020   CL 105 12/15/2020   CO2 23 12/15/2020   GLUCOSE 108 (H) 12/15/2020   BUN 11 12/15/2020   CREATININE 0.73 12/15/2020   BILITOT 0.3 12/15/2020   ALKPHOS 96 03/12/2009   AST 17 12/15/2020   ALT 12 12/15/2020   PROT 7.7 12/15/2020   ALBUMIN 4.0 03/12/2009   CALCIUM 9.0 12/15/2020   GFRAA 103 06/21/2020    Speciality Comments: PLQ Eye Exam: 01/24/2021 WNL @ [facility] follow up in 1 year  Procedures:  No procedures performed Allergies: Codeine, Lacosamide, Penicillins, Prochlorperazine, Sulfa antibiotics, Cetirizine, Ciprofloxacin, Iodinated contrast media,  Lisinopril, Naproxen, Nitrofurantoin, Other, Pregabalin, Compazine, Gabapentin, Lipitor [atorvastatin], Nitrofurantoin monohyd macro, Sulfasalazine, and Dexamethasone   Assessment / Plan:     Visit Diagnoses: No diagnosis found.  Orders: No orders of the defined types were placed in this encounter.  No orders of the defined types were placed in this encounter.   Face-to-face time spent with patient was *** minutes. Greater than 50% of time was spent in counseling and coordination of care.  Follow-Up Instructions: No follow-ups on file.   Ellen Henri, CMA  Note - This record has been created using Animal nutritionist.  Chart creation errors have been sought, but may not always  have been located. Such creation errors do not reflect on  the standard of medical care.

## 2021-09-14 NOTE — Progress Notes (Unsigned)
Office Visit Note  Patient: Haley Goodman             Date of Birth: 21-Aug-1980           MRN: [id number]             PCP: Stevphen Rochester, MD Referring: Stevphen Rochester, MD Visit Date: 09/22/2021 Occupation: @GUAROCC @  Subjective:  Medication monitoring  History of Present Illness: Haley Goodman is a 41 y.o. female with history of seropositive rheumatoid arthritis, fibromyalgia, and osteoarthritis. She is taking plaquenil 200 mg 1 tablet by mouth twice daily.  She is tolerating Plaquenil without any side effects and has not missed any doses recently.  She denies any signs or symptoms of a rheumatoid arthritis flare.  She is not experiencing any joint pain at this time.  She has not had any nocturnal symptoms. Her morning stiffness has been lasting about 5 minutes daily.  She has had no difficulty with ADLs. She states her RA is usually well controlled during the summer months.     Activities of Daily Living:  Patient reports morning stiffness for 5 minutes.   Patient Denies nocturnal pain.  Difficulty dressing/grooming: Denies Difficulty climbing stairs: Denies Difficulty getting out of chair: Denies Difficulty using hands for taps, buttons, cutlery, and/or writing: Reports  Review of Systems  Constitutional:  Positive for fatigue.  HENT:  Positive for mouth dryness. Negative for mouth sores and nose dryness.   Eyes:  Positive for dryness. Negative for pain and visual disturbance.  Respiratory:  Negative for cough, hemoptysis and difficulty breathing.   Cardiovascular:  Negative for chest pain, palpitations, hypertension and swelling in legs/feet.  Gastrointestinal:  Negative for blood in stool, constipation and diarrhea.  Endocrine: Negative for increased urination.  Genitourinary:  Negative for painful urination and involuntary urination.  Musculoskeletal:  Positive for morning stiffness. Negative for joint pain, joint pain, joint swelling, myalgias,  muscle weakness, muscle tenderness and myalgias.  Skin:  Negative for color change, pallor, rash, hair loss, nodules/bumps, skin tightness, ulcers and sensitivity to sunlight.  Neurological:  Negative for dizziness, numbness, headaches and weakness.  Hematological:  Negative for swollen glands.  Psychiatric/Behavioral:  Negative for depressed mood and sleep disturbance. The patient is not nervous/anxious.     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   History of subdural hemorrhage    Migraine headaches    Weakness of right side of body    Ruptured aneurysm of artery    Hypercholesterolemia 03/01/2021   Immunodeficiency due to drugs 03/01/2021   Neck pain 08/24/2020   Osteoarthritis (arthritis due to wear and tear of joints) 04/16/2020   History of colonic polyps 01/02/2020   Allergic rhinitis 07/25/2019   GERD (gastroesophageal reflux disease) 07/25/2019   Endometritis 07/14/2019   Chronic low back pain 06/11/2019   Dry eyes 09/04/2018   Macromastia 09/04/2018   Degeneration of lumbar intervertebral disc 09/04/2018   Chronic anticoagulation 08/07/2017   Pelvic pain in female 04/11/2017   Major depressive disorder    Fibromyalgia 05/10/2016   Chronic pain syndrome 05/10/2016   High risk medication use/ Plaquenil 05/10/2016   Spondylosis of lumbar region without myelopathy or radiculopathy 05/10/2016   Abnormal serum protein electrophoresis 05/10/2016   Vitamin D deficiency 05/10/2016   Rheumatoid arthritis involving multiple sites with positive rheumatoid factor    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  Type 2 diabetes mellitus without complication, with long-term current use of insulin 02/27/2014   Obesity 09/10/2013   Alopecia 09/10/2013   Bipolar disorder in partial remission 09/10/2013   Cerebral infarction 09/10/2013   Pulmonary embolism without acute  cor pulmonale ([place]) 03/05/2009   Elevated liver enzymes 03/05/2009   Hypercoagulable state, secondary 02/15/2009   S/P cholecystectomy 02/15/2009   Primary insomnia 04/27/2008   Essential hypertension, benign 03/30/2008   Systemic lupus erythematosus 03/30/2008    Past Medical History:  Diagnosis Date   Abnormal serum protein electrophoresis 05/10/2016   Labs from April 23, 2015, SPEP and M-Spike is negative except 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 Related to Lupus, better on Plaquenil   Anemia due to acute blood loss 02/15/2009   Arthritis    Bipolar disorder in partial remission 09/10/2013   Hx psychosis/ hallucinations, last in 2012   Cerebral infarction 09/10/2013   Chronic anticoagulation 08/07/2017   Chronic low back pain 06/11/2019   Chronic pain syndrome 05/10/2016   Elevated liver enzymes 03/05/2009   Essential hypertension, benign 03/30/2008   Fibromyalgia    GERD (gastroesophageal reflux disease) 07/25/2019   History of colonic polyps 01/02/2020   Negative colonoscopy performed in November 2021   History of subdural hemorrhage    Hypercholesterolemia 03/01/2021   Hypercoagulable state, secondary 02/15/2009   Immunodeficiency due to drugs 03/01/2021   Major depressive disorder    Migraine headaches    Mild neurocognitive disorder due to multiple etiologies 03/22/2021   Neck pain 08/24/2020   Osteoarthritis (arthritis due to wear and tear of joints) 04/16/2020   PE (pulmonary embolism) 2009   Pelvic pain in female 04/11/2017   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   S/P cholecystectomy 02/15/2009   Spondylosis of lumbar region without myelopathy or radiculopathy 05/10/2016   Subdural hematoma (HCC)    Systemic lupus erythematosus 03/30/2008   scalp skin lesions, on Plaquenil, 2009   Type 2 diabetes mellitus without complication, with long-term current use of insulin 02/27/2014   steroid induced   Upper respiratory tract infection 10/02/2018   Vitamin D deficiency 05/10/2016   Weakness of right side of body     Family History  Problem Relation Age of Onset   Stroke Mother    Cancer Mother    Cancer Sister    Dementia Maternal Grandmother 83   Cancer Other    Diabetes Other    Dementia Maternal Great-grandmother 26   Pseudochol deficiency Neg Hx    Malignant hyperthermia Neg Hx    Hypotension Neg Hx    Anesthesia problems Neg Hx    Past Surgical History:  Procedure Laterality  Date   ABLATION     BRAIN SURGERY     BREAST REDUCTION SURGERY  01/28/2020   Dr. Sandi Mealy at [facility]     HYSTEROSCOPY  12/30/2019   Dr. Jeanell Sparrow   TUBAL LIGATION     Social History   Social History Narrative   Not on file   Immunization History  Administered Date(s) Administered   Influenza Split 01/26/2014   Influenza, Seasonal, Injecte, Preservative Fre 12/05/2010, 12/23/2015   Influenza,inj,Quad PF,6+ Mos 12/05/2017   Influenza-Unspecified 03/30/2008, 11/22/2009, 02/11/2013, 12/18/2013, 11/25/2014   Moderna  Sars-Covid-2 Vaccination 09/18/2019, 10/16/2019, 03/18/2020   PPD Test 10/03/2017   Pneumococcal Conjugate-13 02/27/2013   Pneumococcal Polysaccharide-23 04/07/2011   Tdap 09/10/2013     Objective: Vital Signs: BP 137/89 (BP Location: Left Arm, Patient Position: Sitting, Cuff Size: Normal)   Pulse 63   Ht 5\' 7"  (1.702 m)   Wt 213 lb (96.6 kg)   BMI 33.36 kg/m    Physical Exam Vitals and nursing note reviewed.  Constitutional:      Appearance: She is well-developed.  HENT:     Head: Normocephalic and atraumatic.  Eyes:     Conjunctiva/sclera: Conjunctivae normal.  Cardiovascular:     Rate and Rhythm: Normal rate and regular rhythm.     Heart sounds: Normal heart sounds.  Pulmonary:     Effort: Pulmonary effort is normal.     Breath sounds: Normal breath sounds.  Abdominal:     General: Bowel sounds are normal.     Palpations: Abdomen is soft.  Musculoskeletal:     Cervical back: Normal range of motion.  Skin:    General: Skin is warm and dry.     Capillary Refill: Capillary refill takes less than 2 seconds.  Neurological:     Mental Status: She is alert and oriented to person, place, and time.  Psychiatric:        Behavior: Behavior normal.      Musculoskeletal Exam: C-spine, thoracic spine, lumbar spine have good range of motion with no discomfort.  Shoulder joints, joints, wrist joints, MCPs, PIPs, DIPs have good range of motion with no synovitis.  She was able to make a complete fist bilaterally.  Hip joints have good range of motion with no groin pain.  Knee joints have good range of motion with no warmth or effusion.  Ankle joints have good range of motion with no tenderness or joint swelling.  No tenderness over MTP joints.  No evidence of Achilles tendinitis or planter fasciitis.  CDAI Exam: CDAI Score: 0.4  Patient Global: 2 mm; Provider Global: 2 mm Swollen: 0 ; Tender: 0  Joint Exam 09/22/2021   No joint exam has been documented for this visit   There is  currently no information documented on the homunculus. Go to the Rheumatology activity and complete the homunculus joint exam.  Investigation: No additional findings.  Imaging: No results found.  Recent Labs: Lab Results  Component Value Date   WBC 8.0 12/15/2020   HGB 11.8 12/15/2020   PLT 270 12/15/2020   NA 138 12/15/2020   K 4.4 12/15/2020   CL 105 12/15/2020   CO2 23 12/15/2020   GLUCOSE 108 (H) 12/15/2020   BUN 11 12/15/2020   CREATININE 0.73 12/15/2020   BILITOT 0.3 12/15/2020   ALKPHOS 96 03/12/2009   AST 17 12/15/2020   ALT 12 12/15/2020   PROT 7.7 12/15/2020  ALBUMIN 4.0 03/12/2009   CALCIUM 9.0 12/15/2020   GFRAA 103 06/21/2020    Speciality Comments: PLQ Eye Exam: 01/24/2021 WNL @ [facility] follow up in 1 year  Procedures:  No procedures performed Allergies: Codeine, Lacosamide, Peanut oil, Penicillins, Prochlorperazine, Soybean-containing drug products, Sulfa antibiotics, Wheat bran, Cetirizine, Ciprofloxacin, Iodinated contrast media, Lisinopril, Naproxen, Nitrofurantoin, Other, Pregabalin, Compazine, Gabapentin, Lipitor [atorvastatin], Nitrofurantoin monohyd macro, Sulfasalazine, and Dexamethasone   Assessment / Plan:     Visit Diagnoses: Rheumatoid arthritis involving multiple sites with positive rheumatoid factor (HCC) -She has no joint tenderness or synovitis on examination today.  She has not had any signs or symptoms of a rheumatoid arthritis during the summer months.  Her morning stiffness has only been lasting 5 minutes daily.  She has not had any nocturnal pain or difficulty with ADLs.  She has clinically been doing well taking Plaquenil 200 mg 1 tablet by mouth twice daily.  She is tolerating Plaquenil without any side effects and has not missed any doses recently.  She will remain on Plaquenil as prescribed.  Refill sent to the pharmacy today.  We will update CBC and CMP to monitor for drug toxicity.  She was advised to notify us if she  develops signs or symptoms of a flare.  She will follow-up in the office in 5 months or sooner if needed.  Plan: hydroxychloroquine (PLAQUENIL) 200 MG tablet  High risk medication use - Plaquenil 200 mg 1 tablet by mouth twice daily.  PLQ Eye Exam: 01/24/2021 WNL @ [facility] follow up in 1 year CBC and CMP updated on 05/03/21.  Orders for CBC and CMP were released today. - Plan: CBC with Differential/Platelet, COMPLETE METABOLIC PANEL WITH GFR  Chronic pain of both shoulders: She has good range of motion of both shoulder joints on examination today.  No discomfort at this time.  Primary osteoarthritis of both hands: No joint tenderness or inflammation noted.  She is able to make a complete fist bilaterally.  Discussed the importance of joint protection and muscle strengthening.  Fibromyalgia - She experiences intermittent myalgias and muscle tenderness due to fibromyalgia.  Overall her symptoms have been tolerable with warmer weather temperatures.  She continues to follow-up with pain management and takes oxycodone every 8 hours as needed for moderate to severe pain relief.  Trapezius muscle spasm - She takes Flexeril 10 mg every 8 hours as needed for muscle spasms.  Other medical conditions are listed as follows:   History of vitamin D deficiency: She is taking vitamin D 50,000 units once weekly.   Positive PPD  History of pulmonary embolism  History of depression  History of hypertension: Blood pressure was 137/89 today in the office.  Moderate persistent asthma with acute exacerbation  History of psychosis  Orders: Orders Placed This Encounter  Procedures   CBC with Differential/Platelet   COMPLETE METABOLIC PANEL WITH GFR   Meds ordered this encounter  Medications   hydroxychloroquine (PLAQUENIL) 200 MG tablet    Sig: Take 1 tablet by mouth twice daily.    Dispense:  180 tablet    Refill:  0     Follow-Up Instructions: Return in about 5 months (around  02/22/2022) for Rheumatoid arthritis, Fibromyalgia, Osteoarthritis.   Gearldine Bienenstock, PA-C  Note - This record has been created using Dragon software.  Chart creation errors have been sought, but may not always  have been located. Such creation errors do not reflect on  the standard of medical care.

## 2021-09-16 ENCOUNTER — Ambulatory Visit: Payer: 59 | Admitting: Rheumatology

## 2021-09-16 DIAGNOSIS — M797 Fibromyalgia: Secondary | ICD-10-CM

## 2021-09-16 DIAGNOSIS — R7611 Nonspecific reaction to tuberculin skin test without active tuberculosis: Secondary | ICD-10-CM

## 2021-09-16 DIAGNOSIS — Z8679 Personal history of other diseases of the circulatory system: Secondary | ICD-10-CM

## 2021-09-16 DIAGNOSIS — M62838 Other muscle spasm: Secondary | ICD-10-CM

## 2021-09-16 DIAGNOSIS — M0579 Rheumatoid arthritis with rheumatoid factor of multiple sites without organ or systems involvement: Secondary | ICD-10-CM

## 2021-09-16 DIAGNOSIS — J4541 Moderate persistent asthma with (acute) exacerbation: Secondary | ICD-10-CM

## 2021-09-16 DIAGNOSIS — Z86711 Personal history of pulmonary embolism: Secondary | ICD-10-CM

## 2021-09-16 DIAGNOSIS — Z8659 Personal history of other mental and behavioral disorders: Secondary | ICD-10-CM

## 2021-09-16 DIAGNOSIS — Z79899 Other long term (current) drug therapy: Secondary | ICD-10-CM

## 2021-09-16 DIAGNOSIS — Z8639 Personal history of other endocrine, nutritional and metabolic disease: Secondary | ICD-10-CM

## 2021-09-16 DIAGNOSIS — M19041 Primary osteoarthritis, right hand: Secondary | ICD-10-CM

## 2021-09-16 DIAGNOSIS — G8929 Other chronic pain: Secondary | ICD-10-CM

## 2021-09-22 ENCOUNTER — Encounter: Payer: Self-pay | Admitting: Physician Assistant

## 2021-09-22 ENCOUNTER — Ambulatory Visit (INDEPENDENT_AMBULATORY_CARE_PROVIDER_SITE_OTHER): Payer: 59 | Admitting: Physician Assistant

## 2021-09-22 VITALS — BP 137/89 | HR 63 | Ht 67.0 in | Wt 213.0 lb

## 2021-09-22 DIAGNOSIS — Z8639 Personal history of other endocrine, nutritional and metabolic disease: Secondary | ICD-10-CM

## 2021-09-22 DIAGNOSIS — M0579 Rheumatoid arthritis with rheumatoid factor of multiple sites without organ or systems involvement: Secondary | ICD-10-CM

## 2021-09-22 DIAGNOSIS — M19041 Primary osteoarthritis, right hand: Secondary | ICD-10-CM | POA: Diagnosis not present

## 2021-09-22 DIAGNOSIS — M25511 Pain in right shoulder: Secondary | ICD-10-CM

## 2021-09-22 DIAGNOSIS — Z8659 Personal history of other mental and behavioral disorders: Secondary | ICD-10-CM

## 2021-09-22 DIAGNOSIS — M25512 Pain in left shoulder: Secondary | ICD-10-CM

## 2021-09-22 DIAGNOSIS — J4541 Moderate persistent asthma with (acute) exacerbation: Secondary | ICD-10-CM

## 2021-09-22 DIAGNOSIS — G8929 Other chronic pain: Secondary | ICD-10-CM

## 2021-09-22 DIAGNOSIS — M797 Fibromyalgia: Secondary | ICD-10-CM

## 2021-09-22 DIAGNOSIS — M62838 Other muscle spasm: Secondary | ICD-10-CM

## 2021-09-22 DIAGNOSIS — Z79899 Other long term (current) drug therapy: Secondary | ICD-10-CM | POA: Diagnosis not present

## 2021-09-22 DIAGNOSIS — M19042 Primary osteoarthritis, left hand: Secondary | ICD-10-CM

## 2021-09-22 DIAGNOSIS — Z8679 Personal history of other diseases of the circulatory system: Secondary | ICD-10-CM

## 2021-09-22 DIAGNOSIS — R7611 Nonspecific reaction to tuberculin skin test without active tuberculosis: Secondary | ICD-10-CM

## 2021-09-22 DIAGNOSIS — Z86711 Personal history of pulmonary embolism: Secondary | ICD-10-CM

## 2021-09-22 MED ORDER — HYDROXYCHLOROQUINE SULFATE 200 MG PO TABS: ORAL_TABLET | ORAL | 0 refills | Status: DC

## 2021-09-23 LAB — CBC WITH DIFFERENTIAL/PLATELET
Absolute Monocytes: 496 {cells}/uL (ref 200–950)
Basophils Absolute: 32 {cells}/uL (ref 0–200)
Basophils Relative: 0.4 %
Eosinophils Absolute: 376 {cells}/uL (ref 15–500)
Eosinophils Relative: 4.7 %
HCT: 34.5 % — ABNORMAL LOW (ref 35.0–45.0)
Hemoglobin: 11.1 g/dL — ABNORMAL LOW (ref 11.7–15.5)
Lymphs Abs: 3392 {cells}/uL (ref 850–3900)
MCH: 26.6 pg — ABNORMAL LOW (ref 27.0–33.0)
MCHC: 32.2 g/dL (ref 32.0–36.0)
MCV: 82.7 fL (ref 80.0–100.0)
MPV: 11.2 fL (ref 7.5–12.5)
Monocytes Relative: 6.2 %
Neutro Abs: 3704 {cells}/uL (ref 1500–7800)
Neutrophils Relative %: 46.3 %
Platelets: 251 Thousand/uL (ref 140–400)
RBC: 4.17 Million/uL (ref 3.80–5.10)
RDW: 13.6 % (ref 11.0–15.0)
Total Lymphocyte: 42.4 %
WBC: 8 Thousand/uL (ref 3.8–10.8)

## 2021-09-23 LAB — COMPLETE METABOLIC PANEL WITH GFR
AG Ratio: 1.5 (calc) (ref 1.0–2.5)
ALT: 10 U/L (ref 6–29)
AST: 13 U/L (ref 10–30)
Albumin: 3.9 g/dL (ref 3.6–5.1)
Alkaline phosphatase (APISO): 60 U/L (ref 31–125)
BUN: 13 mg/dL (ref 7–25)
CO2: 21 mmol/L (ref 20–32)
Calcium: 8.8 mg/dL (ref 8.6–10.2)
Chloride: 110 mmol/L (ref 98–110)
Creat: 0.97 mg/dL (ref 0.50–0.99)
Globulin: 2.6 g/dL (calc) (ref 1.9–3.7)
Glucose, Bld: 104 mg/dL — ABNORMAL HIGH (ref 65–99)
Potassium: 4.3 mmol/L (ref 3.5–5.3)
Sodium: 139 mmol/L (ref 135–146)
Total Bilirubin: 0.3 mg/dL (ref 0.2–1.2)
Total Protein: 6.5 g/dL (ref 6.1–8.1)
eGFR: 75 mL/min/{1.73_m2} (ref >=60)

## 2021-09-23 NOTE — Progress Notes (Signed)
Glucose is 104. Rest of CMP WNL.  Hgb and hct are slightly low.  Please notify the patient.  MCH is borderline low but stable.  Please advise patient to try taking a multivitamin with iron

## 2021-11-27 ENCOUNTER — Other Ambulatory Visit: Payer: Self-pay | Admitting: Allergy & Immunology

## 2021-12-03 ENCOUNTER — Other Ambulatory Visit: Payer: Self-pay | Admitting: Allergy & Immunology

## 2022-02-16 NOTE — Progress Notes (Deleted)
Office Visit Note  Patient: Haley Goodman             Date of Birth: 06/02/1980           MRN: [id number]             PCP: Stevphen Rochester, MD Referring: Stevphen Rochester, MD Visit Date: 03/02/2022 Occupation: @GUAROCC @  Subjective:  No chief complaint on file.   History of Present Illness: Shauntia Heffelfinger is a 41 y.o. female ***     Activities of Daily Living:  Patient reports morning stiffness for *** {minute/hour:19697}.   Patient {ACTIONS;DENIES/REPORTS:21021675::"Denies"} nocturnal pain.  Difficulty dressing/grooming: {ACTIONS;DENIES/REPORTS:21021675::"Denies"} Difficulty climbing stairs: {ACTIONS;DENIES/REPORTS:21021675::"Denies"} Difficulty getting out of chair: {ACTIONS;DENIES/REPORTS:21021675::"Denies"} Difficulty using hands for taps, buttons, cutlery, and/or writing: {ACTIONS;DENIES/REPORTS:21021675::"Denies"}  No Rheumatology ROS completed.   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   History of subdural hemorrhage    Migraine headaches    Weakness of right side of body    Ruptured aneurysm of artery    Hypercholesterolemia 03/01/2021   Immunodeficiency due to drugs 03/01/2021   Neck pain 08/24/2020   Osteoarthritis (arthritis due to wear and tear of joints) 04/16/2020   History of colonic polyps 01/02/2020   Allergic rhinitis 07/25/2019   GERD (gastroesophageal reflux disease) 07/25/2019   Endometritis 07/14/2019   Chronic low back pain 06/11/2019   Dry eyes 09/04/2018   Macromastia 09/04/2018   Degeneration of lumbar intervertebral disc 09/04/2018   Chronic anticoagulation 08/07/2017   Pelvic pain in female 04/11/2017   Major depressive disorder    Fibromyalgia 05/10/2016   Chronic pain syndrome 05/10/2016   High risk medication use/ Plaquenil 05/10/2016   Spondylosis of lumbar region without myelopathy or  radiculopathy 05/10/2016   Abnormal serum protein electrophoresis 05/10/2016   Vitamin D deficiency 05/10/2016   Rheumatoid arthritis involving multiple sites with positive rheumatoid factor    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   Type 2 diabetes mellitus without complication, with long-term current use of insulin 02/27/2014   Obesity 09/10/2013   Alopecia 09/10/2013   Bipolar disorder in partial remission 09/10/2013   Cerebral infarction 09/10/2013   Pulmonary embolism without acute cor pulmonale (HCC) 03/05/2009   Elevated liver enzymes 03/05/2009   Hypercoagulable state, secondary 02/15/2009   S/P cholecystectomy 02/15/2009   Primary insomnia 04/27/2008   Essential hypertension, benign 03/30/2008   Systemic lupus erythematosus 03/30/2008    Past Medical History:  Diagnosis Date   Abnormal serum protein electrophoresis 05/10/2016   Labs from April 23, 2015, SPEP and M-Spike is negative except 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 Related to Lupus, better on Plaquenil   Anemia due to acute blood loss 02/15/2009   Arthritis    Bipolar disorder in partial remission 09/10/2013   Hx psychosis/ hallucinations, last in 2012   Cerebral infarction 09/10/2013   Chronic anticoagulation 08/07/2017   Chronic low back pain 06/11/2019   Chronic pain syndrome 05/10/2016   Elevated liver enzymes 03/05/2009   Essential hypertension, benign 03/30/2008   Fibromyalgia  GERD (gastroesophageal reflux disease) 07/25/2019   History of colonic polyps 01/02/2020   Negative colonoscopy performed in November 2021   History of subdural hemorrhage     Hypercholesterolemia 03/01/2021   Hypercoagulable state, secondary 02/15/2009   Immunodeficiency due to drugs 03/01/2021   Major depressive disorder    Migraine headaches    Mild neurocognitive disorder due to multiple etiologies 03/22/2021   Neck pain 08/24/2020   Osteoarthritis (arthritis due to wear and tear of joints) 04/16/2020   PE (pulmonary embolism) 2009   Pelvic pain in female 04/11/2017   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   S/P cholecystectomy 02/15/2009   Spondylosis of lumbar region without myelopathy or radiculopathy 05/10/2016   Subdural hematoma (HCC)    Systemic lupus erythematosus 03/30/2008   scalp skin lesions, on Plaquenil, 2009   Type 2 diabetes mellitus without complication, with long-term current use of insulin 02/27/2014   steroid induced   Upper respiratory tract infection 10/02/2018   Vitamin D deficiency 05/10/2016   Weakness of right side of body     Family History  Problem Relation Age of Onset   Stroke Mother    Cancer Mother    Cancer Sister    Dementia Maternal Grandmother 70   Cancer Other    Diabetes Other    Dementia Maternal Great-grandmother 29   Pseudochol deficiency Neg Hx    Malignant hyperthermia Neg Hx    Hypotension Neg Hx    Anesthesia problems Neg Hx    Past Surgical History:  Procedure Laterality Date   ABLATION     BRAIN SURGERY     BREAST REDUCTION SURGERY  01/28/2020   Dr. Deforest Hoyles at [facility]   CHOLECYSTECTOMY     DILATION AND CURETTAGE OF UTERUS     HYSTEROSCOPY  12/30/2019   Dr. Rosalia Hammers   TUBAL LIGATION     Social History   Social History Narrative   Not on file   Immunization History  Administered Date(s) Administered   Influenza Split 01/26/2014   Influenza, Seasonal, Injecte, Preservative Fre 12/05/2010, 12/23/2015   Influenza,inj,Quad PF,6+ Mos 12/05/2017   Influenza-Unspecified 03/30/2008, 11/22/2009, 02/11/2013, 12/18/2013, 11/25/2014   Moderna Sars-Covid-2 Vaccination 09/18/2019, 10/16/2019, 03/18/2020   PPD Test 10/03/2017   Pneumococcal Conjugate-13 02/27/2013   Pneumococcal Polysaccharide-23 04/07/2011   Tdap 09/10/2013     Objective: Vital Signs: There were no vitals taken for this visit.   Physical Exam   Musculoskeletal Exam: ***  CDAI Exam: CDAI Score: -- Patient Global: --; Provider Global: -- Swollen: --; Tender: -- Joint Exam 03/02/2022   No joint exam has been documented for this visit   There is currently no information documented on the homunculus. Go to the Rheumatology activity and complete the homunculus joint exam.  Investigation: No additional findings.  Imaging: No results found.  Recent Labs: Lab Results  Component Value Date   WBC 8.0 09/22/2021   HGB 11.1 (L) 09/22/2021   PLT 251 09/22/2021   NA 139 09/22/2021  K 4.3 09/22/2021   CL 110 09/22/2021   CO2 21 09/22/2021   GLUCOSE 104 (H) 09/22/2021   BUN 13 09/22/2021   CREATININE 0.97 09/22/2021   BILITOT 0.3 09/22/2021   ALKPHOS 96 03/12/2009   AST 13 09/22/2021   ALT 10 09/22/2021   PROT 6.5 09/22/2021   ALBUMIN 4.0 03/12/2009   CALCIUM 8.8 09/22/2021   GFRAA 103 06/21/2020    Speciality Comments: PLQ Eye Exam: 01/24/2021 WNL @ [facility] follow up in 1 year  Procedures:  No procedures performed Allergies: Codeine, Lacosamide, Peanut oil, Penicillins, Prochlorperazine, Soybean-containing drug products, Sulfa antibiotics, Wheat  bran, Cetirizine, Ciprofloxacin, Iodinated contrast media, Lisinopril, Naproxen, Nitrofurantoin, Other, Pregabalin, Compazine, Gabapentin, Lipitor [atorvastatin], Nitrofurantoin monohyd macro, Sulfasalazine, and Dexamethasone   Assessment / Plan:     Visit Diagnoses: No diagnosis found.  Orders: No orders of the defined types were placed in this encounter.  No orders of the defined types were placed in this encounter.   Face-to-face time spent with patient was *** minutes. Greater than 50% of time was spent in counseling and coordination of care.  Follow-Up Instructions: No follow-ups on file.   Earnestine Mealing, CMA  Note - This record has been created using Editor, commissioning.  Chart creation errors have been sought, but may not always  have been located. Such creation errors do not reflect on  the standard of medical care.

## 2022-03-02 ENCOUNTER — Ambulatory Visit: Payer: 59 | Admitting: Rheumatology

## 2022-03-02 DIAGNOSIS — Z79899 Other long term (current) drug therapy: Secondary | ICD-10-CM

## 2022-03-02 DIAGNOSIS — Z8679 Personal history of other diseases of the circulatory system: Secondary | ICD-10-CM

## 2022-03-02 DIAGNOSIS — M0579 Rheumatoid arthritis with rheumatoid factor of multiple sites without organ or systems involvement: Secondary | ICD-10-CM

## 2022-03-02 DIAGNOSIS — M797 Fibromyalgia: Secondary | ICD-10-CM

## 2022-03-02 DIAGNOSIS — G8929 Other chronic pain: Secondary | ICD-10-CM

## 2022-03-02 DIAGNOSIS — M62838 Other muscle spasm: Secondary | ICD-10-CM

## 2022-03-02 DIAGNOSIS — J4541 Moderate persistent asthma with (acute) exacerbation: Secondary | ICD-10-CM

## 2022-03-02 DIAGNOSIS — Z86711 Personal history of pulmonary embolism: Secondary | ICD-10-CM

## 2022-03-02 DIAGNOSIS — Z8659 Personal history of other mental and behavioral disorders: Secondary | ICD-10-CM

## 2022-03-02 DIAGNOSIS — M19041 Primary osteoarthritis, right hand: Secondary | ICD-10-CM

## 2022-03-02 DIAGNOSIS — Z8639 Personal history of other endocrine, nutritional and metabolic disease: Secondary | ICD-10-CM

## 2022-03-02 DIAGNOSIS — R7611 Nonspecific reaction to tuberculin skin test without active tuberculosis: Secondary | ICD-10-CM

## 2022-03-09 NOTE — Progress Notes (Deleted)
Office Visit Note  Patient: Haley Goodman             Date of Birth: 1981/02/19           MRN: [id number]             PCP: Jolinda Croak, MD Referring: Jolinda Croak, MD Visit Date: 03/23/2022 Occupation: '@GUAROCC'$ @  Subjective:  No chief complaint on file.   History of Present Illness: Haley Goodman is a 42 y.o. female ***     Activities of Daily Living:  Patient reports morning stiffness for *** {minute/hour:19697}.   Patient {ACTIONS;DENIES/REPORTS:21021675::"Denies"} nocturnal pain.  Difficulty dressing/grooming: {ACTIONS;DENIES/REPORTS:21021675::"Denies"} Difficulty climbing stairs: {ACTIONS;DENIES/REPORTS:21021675::"Denies"} Difficulty getting out of chair: {ACTIONS;DENIES/REPORTS:21021675::"Denies"} Difficulty using hands for taps, buttons, cutlery, and/or writing: {ACTIONS;DENIES/REPORTS:21021675::"Denies"}  No Rheumatology ROS completed.   PMFS History:  Patient Active Problem List   Diagnosis Date Noted   Subdural hematoma ([place])    Mild neurocognitive disorder due to multiple etiologies 03/22/2021   History of subdural hemorrhage    Migraine headaches    Weakness of right side of body    Ruptured aneurysm of artery    Hypercholesterolemia 03/01/2021   Immunodeficiency due to drugs 03/01/2021   Neck pain 08/24/2020   Osteoarthritis (arthritis due to wear and tear of joints) 04/16/2020   History of colonic polyps 01/02/2020   Allergic rhinitis 07/25/2019   GERD (gastroesophageal reflux disease) 07/25/2019   Endometritis 07/14/2019   Chronic low back pain 06/11/2019   Dry eyes 09/04/2018   Macromastia 09/04/2018   Degeneration of lumbar intervertebral disc 09/04/2018   Chronic anticoagulation 08/07/2017   Pelvic pain in female 04/11/2017   Major depressive disorder    Fibromyalgia 05/10/2016   Chronic pain syndrome 05/10/2016   High risk medication use/ Plaquenil 05/10/2016   Spondylosis of lumbar region without myelopathy or  radiculopathy 05/10/2016   Abnormal serum protein electrophoresis 05/10/2016   Vitamin D deficiency 05/10/2016   Rheumatoid arthritis involving multiple sites with positive rheumatoid factor    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   Type 2 diabetes mellitus without complication, with long-term current use of insulin 02/27/2014   Obesity 09/10/2013   Alopecia 09/10/2013   Bipolar disorder in partial remission 09/10/2013   Cerebral infarction 09/10/2013   Pulmonary embolism without acute cor pulmonale ([place]) 03/05/2009   Elevated liver enzymes 03/05/2009   Hypercoagulable state, secondary 02/15/2009   S/P cholecystectomy 02/15/2009   Primary insomnia 04/27/2008   Essential hypertension, benign 03/30/2008   Systemic lupus erythematosus 03/30/2008    Past Medical History:  Diagnosis Date   Abnormal serum protein electrophoresis 05/10/2016   Labs from April 23, 2015, SPEP and M-Spike is negative except 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 Related to Lupus, better on Plaquenil   Anemia due to acute blood loss 02/15/2009   Arthritis    Bipolar disorder in partial remission 09/10/2013   Hx psychosis/ hallucinations, last in 2012   Cerebral infarction 09/10/2013   Chronic anticoagulation 08/07/2017   Chronic low back pain 06/11/2019   Chronic pain syndrome 05/10/2016   Elevated liver enzymes 03/05/2009   Essential hypertension, benign 03/30/2008   Fibromyalgia  GERD (gastroesophageal reflux disease) 07/25/2019   History of colonic polyps 01/02/2020   Negative colonoscopy performed in November 2021   History of subdural hemorrhage     Hypercholesterolemia 03/01/2021   Hypercoagulable state, secondary 02/15/2009   Immunodeficiency due to drugs 03/01/2021   Major depressive disorder    Migraine headaches    Mild neurocognitive disorder due to multiple etiologies 03/22/2021   Neck pain 08/24/2020   Osteoarthritis (arthritis due to wear and tear of joints) 04/16/2020   PE (pulmonary embolism) 2009   Pelvic pain in female 04/11/2017   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   S/P cholecystectomy 02/15/2009   Spondylosis of lumbar region without myelopathy or radiculopathy 05/10/2016   Subdural hematoma (HCC)    Systemic lupus erythematosus 03/30/2008   scalp skin lesions, on Plaquenil, 2009   Type 2 diabetes mellitus without complication, with long-term current use of insulin 02/27/2014   steroid induced   Upper respiratory tract infection 10/02/2018   Vitamin D deficiency 05/10/2016   Weakness of right side of body     Family History  Problem Relation Age of Onset   Stroke Mother    Cancer Mother    Cancer Sister    Dementia Maternal Grandmother 83   Cancer Other    Diabetes Other    Dementia Maternal Great-grandmother 88   Pseudochol deficiency Neg Hx    Malignant hyperthermia Neg Hx    Hypotension Neg Hx    Anesthesia problems Neg Hx    Past Surgical History:  Procedure Laterality Date   ABLATION     BRAIN SURGERY     BREAST REDUCTION SURGERY  01/28/2020   Dr. Sandi Mealy at [place]     HYSTEROSCOPY  12/30/2019   Dr. Jeanell Sparrow   TUBAL LIGATION     Social History   Social History Narrative   Not on file   Immunization History  Administered Date(s) Administered   Influenza Split 01/26/2014   Influenza, Seasonal, Injecte, Preservative Fre 12/05/2010, 12/23/2015   Influenza,inj,Quad PF,6+ Mos 12/05/2017   Influenza-Unspecified 03/30/2008, 11/22/2009, 02/11/2013, 12/18/2013, 11/25/2014   Moderna Sars-Covid-2 Vaccination 09/18/2019, 10/16/2019, 03/18/2020   PPD Test 10/03/2017   Pneumococcal Conjugate-13 02/27/2013   Pneumococcal Polysaccharide-23 04/07/2011   Tdap 09/10/2013     Objective: Vital Signs: There were no vitals taken for this visit.   Physical Exam   Musculoskeletal Exam: ***  CDAI Exam: CDAI Score: -- Patient Global: --; Provider Global: -- Swollen: --; Tender: -- Joint Exam 03/23/2022   No joint exam has been documented for this visit   There is currently no information documented on the homunculus. Go to the Rheumatology activity and complete the homunculus joint exam.  Investigation: No additional findings.  Imaging: No results found.  Recent Labs: Lab Results  Component Value Date   WBC 8.0 09/22/2021   HGB 11.1 (L) 09/22/2021   PLT 251 09/22/2021   NA 139 09/22/2021  K 4.3 09/22/2021   CL 110 09/22/2021   CO2 21 09/22/2021   GLUCOSE 104 (H) 09/22/2021   BUN 13 09/22/2021   CREATININE 0.97 09/22/2021   BILITOT 0.3 09/22/2021   ALKPHOS 96 03/12/2009   AST 13 09/22/2021   ALT 10 09/22/2021   PROT 6.5 09/22/2021   ALBUMIN 4.0 03/12/2009   CALCIUM 8.8 09/22/2021   GFRAA 103 06/21/2020    Speciality Comments: PLQ Eye Exam: 01/24/2021 WNL @ [facility] follow up in 1 year  Procedures:  No procedures performed Allergies: Codeine, Lacosamide, Peanut oil, Penicillins, Prochlorperazine, Soybean-containing drug products, Sulfa antibiotics, Wheat  bran, Cetirizine, Ciprofloxacin, Iodinated contrast media, Lisinopril, Naproxen, Nitrofurantoin, Other, Pregabalin, Compazine, Gabapentin, Lipitor [atorvastatin], Nitrofurantoin monohyd macro, Sulfasalazine, and Dexamethasone   Assessment / Plan:     Visit Diagnoses: No diagnosis found.  Orders: No orders of the defined types were placed in this encounter.  No orders of the defined types were placed in this encounter.   Face-to-face time spent with patient was *** minutes. Greater than 50% of time was spent in counseling and coordination of care.  Follow-Up Instructions: No follow-ups on file.   Earnestine Mealing, CMA  Note - This record has been created using Editor, commissioning.  Chart creation errors have been sought, but may not always  have been located. Such creation errors do not reflect on  the standard of medical care.

## 2022-03-22 ENCOUNTER — Telehealth: Payer: Self-pay | Admitting: Rheumatology

## 2022-03-22 DIAGNOSIS — M0579 Rheumatoid arthritis with rheumatoid factor of multiple sites without organ or systems involvement: Secondary | ICD-10-CM

## 2022-03-22 NOTE — Telephone Encounter (Signed)
Patient requested prescription refill of Hydroxychloroquine to be sent to [facility] in [place].

## 2022-03-22 NOTE — Telephone Encounter (Signed)
Spoke with patient and advised she is overdue for labs and her PLQ eye exam. Patient advised we will need her labs updated and for her to schedule her PLQ eye exam. Patient states she had her exam scheduled but it was cancelled due to the provider having the flu. Advised patient to call and reschedule that appointment. Patient requested lab orders be released to [facility]. Released orders as requested. Patient will advised when her eye exam appointment is scheduled for when we call with lab results.

## 2022-03-23 ENCOUNTER — Ambulatory Visit: Payer: 59 | Admitting: Physician Assistant

## 2022-03-23 DIAGNOSIS — Z8659 Personal history of other mental and behavioral disorders: Secondary | ICD-10-CM

## 2022-03-23 DIAGNOSIS — Z8639 Personal history of other endocrine, nutritional and metabolic disease: Secondary | ICD-10-CM

## 2022-03-23 DIAGNOSIS — G8929 Other chronic pain: Secondary | ICD-10-CM

## 2022-03-23 DIAGNOSIS — M0579 Rheumatoid arthritis with rheumatoid factor of multiple sites without organ or systems involvement: Secondary | ICD-10-CM

## 2022-03-23 DIAGNOSIS — M19042 Primary osteoarthritis, left hand: Secondary | ICD-10-CM

## 2022-03-23 DIAGNOSIS — Z79899 Other long term (current) drug therapy: Secondary | ICD-10-CM

## 2022-03-23 DIAGNOSIS — M797 Fibromyalgia: Secondary | ICD-10-CM

## 2022-03-23 DIAGNOSIS — Z86711 Personal history of pulmonary embolism: Secondary | ICD-10-CM

## 2022-03-23 DIAGNOSIS — J4541 Moderate persistent asthma with (acute) exacerbation: Secondary | ICD-10-CM

## 2022-03-23 DIAGNOSIS — Z8679 Personal history of other diseases of the circulatory system: Secondary | ICD-10-CM

## 2022-03-23 DIAGNOSIS — M62838 Other muscle spasm: Secondary | ICD-10-CM

## 2022-03-23 DIAGNOSIS — R7611 Nonspecific reaction to tuberculin skin test without active tuberculosis: Secondary | ICD-10-CM

## 2022-04-04 ENCOUNTER — Other Ambulatory Visit: Payer: Self-pay | Admitting: Allergy & Immunology

## 2022-04-05 ENCOUNTER — Other Ambulatory Visit: Payer: Self-pay

## 2022-04-05 DIAGNOSIS — M0579 Rheumatoid arthritis with rheumatoid factor of multiple sites without organ or systems involvement: Secondary | ICD-10-CM

## 2022-04-05 LAB — CMP14+EGFR
ALT: 11 IU/L (ref 0–32)
AST: 14 IU/L (ref 0–40)
Albumin/Globulin Ratio: 1.6 (ref 1.2–2.2)
Albumin: 4.4 g/dL (ref 3.9–4.9)
Alkaline Phosphatase: 71 IU/L (ref 44–121)
BUN/Creatinine Ratio: 6 — ABNORMAL LOW (ref 9–23)
BUN: 5 mg/dL — ABNORMAL LOW (ref 6–24)
Bilirubin Total: 0.2 mg/dL (ref 0.0–1.2)
CO2: 19 mmol/L — ABNORMAL LOW (ref 20–29)
Calcium: 9.1 mg/dL (ref 8.7–10.2)
Chloride: 107 mmol/L — ABNORMAL HIGH (ref 96–106)
Creatinine, Ser: 0.82 mg/dL (ref 0.57–1.00)
Globulin, Total: 2.7 g/dL (ref 1.5–4.5)
Glucose: 110 mg/dL — ABNORMAL HIGH (ref 70–99)
Potassium: 4 mmol/L (ref 3.5–5.2)
Sodium: 141 mmol/L (ref 134–144)
Total Protein: 7.1 g/dL (ref 6.0–8.5)
eGFR: 91 mL/min/{1.73_m2} (ref >59)

## 2022-04-05 LAB — CBC WITH DIFFERENTIAL/PLATELET
Basophils Absolute: 0 10*3/uL (ref 0.0–0.2)
Basos: 1 %
EOS (ABSOLUTE): 0.3 10*3/uL (ref 0.0–0.4)
Eos: 5 %
Hematocrit: 35.3 % (ref 34.0–46.6)
Hemoglobin: 11.9 g/dL (ref 11.1–15.9)
Immature Grans (Abs): 0 10*3/uL (ref 0.0–0.1)
Immature Granulocytes: 0 %
Lymphocytes Absolute: 2.5 10*3/uL (ref 0.7–3.1)
Lymphs: 42 %
MCH: 27.4 pg (ref 26.6–33.0)
MCHC: 33.7 g/dL (ref 31.5–35.7)
MCV: 81 fL (ref 79–97)
Monocytes Absolute: 0.3 10*3/uL (ref 0.1–0.9)
Monocytes: 5 %
Neutrophils Absolute: 2.9 10*3/uL (ref 1.4–7.0)
Neutrophils: 47 %
Platelets: 263 10*3/uL (ref 150–450)
RBC: 4.34 x10E6/uL (ref 3.77–5.28)
RDW: 13.9 % (ref 11.7–15.4)
WBC: 6.1 10*3/uL (ref 3.4–10.8)

## 2022-04-05 MED ORDER — HYDROXYCHLOROQUINE SULFATE 200 MG PO TABS: ORAL_TABLET | ORAL | 0 refills | Status: DC

## 2022-04-05 NOTE — Telephone Encounter (Signed)
Patient requested refill of plaquenil.   Next Visit: 04/11/2022  Last Visit: 09/22/2021  Labs: 04/04/2022 Anemia noted which is a stable.  Patient should take multivitamin with iron.  Glucose is mildly elevated, probably not a fasting sample.  Please forward results to her PCP.   Eye exam: 01/24/2021    Current Dose per office note on 09/22/2021: Plaquenil 200 mg 1 tablet by mouth twice daily.    EF:[id number] arthritis involving multiple sites with positive rheumatoid factor   Last Fill: 09/22/2021  Eye exam is scheduled for March 2024 per patient.   Okay to refill Plaquenil?

## 2022-04-05 NOTE — Progress Notes (Signed)
Anemia noted which is a stable.  Patient should take multivitamin with iron.  Glucose is mildly elevated, probably not a fasting sample.  Please forward results to her PCP.

## 2022-04-06 ENCOUNTER — Ambulatory Visit: Payer: 59 | Admitting: Cardiology

## 2022-04-07 ENCOUNTER — Other Ambulatory Visit: Payer: Self-pay

## 2022-04-07 ENCOUNTER — Ambulatory Visit (INDEPENDENT_AMBULATORY_CARE_PROVIDER_SITE_OTHER): Payer: 59 | Admitting: Allergy & Immunology

## 2022-04-07 ENCOUNTER — Encounter: Payer: Self-pay | Admitting: Allergy & Immunology

## 2022-04-07 VITALS — BP 138/72 | HR 96 | Temp 97.9°F | Resp 20 | Ht 67.0 in | Wt 218.0 lb

## 2022-04-07 DIAGNOSIS — J3089 Other allergic rhinitis: Secondary | ICD-10-CM | POA: Diagnosis not present

## 2022-04-07 DIAGNOSIS — K9049 Malabsorption due to intolerance, not elsewhere classified: Secondary | ICD-10-CM | POA: Diagnosis not present

## 2022-04-07 DIAGNOSIS — K219 Gastro-esophageal reflux disease without esophagitis: Secondary | ICD-10-CM | POA: Diagnosis not present

## 2022-04-07 DIAGNOSIS — J4540 Moderate persistent asthma, uncomplicated: Secondary | ICD-10-CM

## 2022-04-07 DIAGNOSIS — J302 Other seasonal allergic rhinitis: Secondary | ICD-10-CM

## 2022-04-07 MED ORDER — TRELEGY ELLIPTA 200-62.5-25 MCG/ACT IN AEPB: 1.0000 | INHALATION_SPRAY | Freq: Every day | RESPIRATORY_TRACT | 5 refills | Status: DC

## 2022-04-07 MED ORDER — EPINEPHRINE 0.3 MG/0.3ML IJ SOAJ: 0.3000 mg | Freq: Once | INTRAMUSCULAR | 2 refills | Status: AC

## 2022-04-07 MED ORDER — FLUTICASONE PROPIONATE 50 MCG/ACT NA SUSP: 2.0000 | Freq: Every day | NASAL | 5 refills | Status: DC

## 2022-04-07 MED ORDER — BENRALIZUMAB 30 MG/ML ~~LOC~~ SOSY
30.0000 mg | PREFILLED_SYRINGE | SUBCUTANEOUS | Status: AC
  Administered 2022-04-07 – 2022-12-19 (×5): 30 mg via SUBCUTANEOUS

## 2022-04-07 MED ORDER — FEXOFENADINE HCL 180 MG PO TABS: 180.0000 mg | ORAL_TABLET | Freq: Every day | ORAL | 5 refills | Status: DC

## 2022-04-07 MED ORDER — MONTELUKAST SODIUM 10 MG PO TABS: 10.0000 mg | ORAL_TABLET | Freq: Every day | ORAL | 5 refills | Status: DC

## 2022-04-07 MED ORDER — ALBUTEROL SULFATE HFA 108 (90 BASE) MCG/ACT IN AERS: 2.0000 | INHALATION_SPRAY | Freq: Four times a day (QID) | RESPIRATORY_TRACT | 2 refills | Status: DC | PRN

## 2022-04-07 NOTE — Progress Notes (Unsigned)
FOLLOW UP  Date of Service/Encounter:  04/07/22   Assessment:   Seasonal and perennial allergic rhinitis (grasses, ragweed, weeds, trees, outdoor molds, dust mites, cat, dog, and cockroach)   Food intolerance (peanuts and green peas and soy)   Gastroesophageal reflux disease   Recurrent infections    Moderate persistent asthma, uncomplicated - with eosinophilic phenotype (AEC XX123456 in February 2024)   Recurrent prednisone use - with multiple complications  Plan/Recommendations:   1. Seasonal and perennial allergic rhinitis - Previous testing showed: grasses, ragweed, weeds, trees, outdoor molds, dust mites, cat, dog, and cockroach. - Continue taking: Allegra (fexofenadine) 1-2 tablets once daily and Singulair (montelukast) 33m daily - Continue taking: Flonase one spray per nostril up to twice daily as needed - You can use an extra dose of the antihistamine, if needed, for breakthrough symptoms.  - Consider nasal saline rinses 1-2 times daily to remove allergens from the nasal cavities as well as help with mucous clearance (this is especially helpful to do before the nasal sprays are given) - Consider allergy shots as a means of long-term control. - Allergy shots "re-train" and "reset" the immune system to ignore environmental allergens and decrease the resulting immune response to those allergens (sneezing, itchy watery eyes, runny nose, nasal congestion, etc).    - Allergy shots improve symptoms in 75-85% of patients.  - It seems that we can avoid allergy shots for now since you are stable.   2. Food intolerance - Continue to avoid peanuts and green peas and soy. - EpiPen refilled today.   3. Gastroesophageal reflux disease - Continue with Nexium 465mtwice daily.   4. Recurrent infections  - Immune work up was normal.   5. Moderate persistent asthma, uncomplicated - Lung testing looked much lower today.  - I think adding on FaBerna Bueould be a great idea to help  control your breathing.  - Daily controller medication(s): Trelegy 200/62.5/25 one puff once daily + Fasenra every month x 3 doses (then every 8 weeks thereafter)  - Prior to physical activity: albuterol 2 puffs 10-15 minutes before physical activity. - Rescue medications: albuterol 4 puffs every 4-6 hours as needed and DuoNeb nebulizer one vial every 4-6 hours as needed - Asthma control goals:  * Full participation in all desired activities (may need albuterol before activity) * Albuterol use two time or less a week on average (not counting use with activity) * Cough interfering with sleep two time or less a month * Oral steroids no more than once a year * No hospitalizations  6. Return in about 3 months (around 07/06/2022). You can see me in [employer] this is easier for you!   Subjective:   Haley Goodman is a [age].o. female presenting today for follow up of  Chief Complaint  Patient presents with   Follow-up    Follow up and refills on meds pt states she has been doing good.    Haley Goodman has a history of the following: Patient Active Problem List   Diagnosis Date Noted   [place] hematoma [place]   Mild neurocognitive disorder due to multiple etiologies 03/22/2021   History of subdural hemorrhage    Migraine headaches    Weakness of right side of body    Ruptured aneurysm of artery    Hypercholesterolemia 03/01/2021   Immunodeficiency due to drugs 03/01/2021   Neck pain 08/24/2020   Osteoarthritis (arthritis due to wear and tear of joints) 04/16/2020   History of colonic  polyps 01/02/2020   Allergic rhinitis 07/25/2019   GERD (gastroesophageal reflux disease) 07/25/2019   Endometritis 07/14/2019   Chronic low back pain 06/11/2019   Dry eyes 09/04/2018   Macromastia 09/04/2018   Degeneration of lumbar intervertebral disc 09/04/2018   Chronic anticoagulation 08/07/2017   Pelvic pain in female 04/11/2017   Major depressive disorder    Fibromyalgia  05/10/2016   Chronic pain syndrome 05/10/2016   High risk medication use/ Plaquenil 05/10/2016   Spondylosis of lumbar region without myelopathy or radiculopathy 05/10/2016   Abnormal serum protein electrophoresis 05/10/2016   Vitamin D deficiency 05/10/2016   Rheumatoid arthritis involving multiple sites with positive rheumatoid factor    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  Haley Goodman is a 42 y.o. female presenting for a follow up visit.  She was last seen in April 2023.  At that time, we started Xyzal 5 mg once daily and continue with Allegra 1-2 times daily and Singulair.  For her food intolerance, she continue to avoid peanuts and green peas as well as soy.  GERD was controlled with Nexium 1-2 times a day.  We did do an immune workup.  Lung testing looked much lower at the time, but did improve with albuterol.  Gave her a Depo-Medrol injection.  We changed her from [facility] to Trelegy and we continued with albuterol as needed.  Her immune workup was normal.  She was protective to 16 on the 23 types of Streptococcus pneumonia.  She was also protective to tetanus and diphtheria.  Since last visit, she has had a rough time. She had two deaths in her family in 2023. Her daughter's grandmother caught an infection from the water in [place]. She was in the hospital through Christmas and they had to amputate both arms and legs. This was  some kind of flesh eating bacteria from a cut on her foot. Then they lost her cousin. It was a roller coaster ride for them. They spent a lot of time in the hospital.   Asthma/Respiratory Symptom History: She is on Trelegy one puff once daily. She has montelukast and DuoNeb. She was recently placed on prednisone around 3 months ago when she had a URI and sinus infection. She thinks that might have been the only time that she was on prednisone in 2023.  She has been on her Trelegy. Lung function looks lower today. She does not wake up coughing at night. She has not used albuterol in a few years. This is mostly in the spring time and she uses it more at that time. She will become winded occasionally. She has lost some weight which has helped. She typically uses it more during the allergy season. She has been trying to exercise more to build up her lungs. She has been working hard on that. She is open to starting a biologic for more full control of her asthma.  She does have an eosinophil count of 300 as of April 04, 2022.  Allergic Rhinitis Symptom  History: Allergies are well controlled. Montelukast did help a lot with her symptoms. She does use nose sprays when she becomes congested.  She only uses the Flonase as needed. She has been on the Allegra rather than the levocetirizine.  She does want a refill of the Flonase and the Allegra.  She does not think that allergy shots are indicated at this point in time.  She only had one episode of sinusitis last year. Overall, immune status has  been better compared to when we first starting seeing her.   Food Allergy Symptom History: She has been avoiding peanuts, green peas, and soy. She is using liquid aminos for her soy sauce substitute.  She does need a new EpiPen.  GERD Symptom History: GERD is better with Nexium BID.  She does need a refill of this.   She is has discoid lupus and fibromyalgia and RA. She sees Dr. Estanislado Pandy every five months. Her is on  Percocet and Flexaril. She also on Plaquenil from Dr. Estanislado Pandy. She goes to [facility] (Dr. Raliegh Ip).     Otherwise, there have been no changes to her past medical history, surgical history, family history, or social history.    Review of Systems  Constitutional:  Positive for malaise/fatigue. Negative for chills, fever and weight loss.  HENT:  Positive for congestion. Negative for ear discharge, ear pain and sinus pain.   Eyes:  Negative for pain, discharge and redness.  Respiratory:  Positive for cough and shortness of breath. Negative for sputum production and wheezing.   Cardiovascular: Negative.  Negative for chest pain and palpitations.  Gastrointestinal:  Negative for abdominal pain, constipation, diarrhea, heartburn, nausea and vomiting.  Skin: Negative.  Negative for itching and rash.  Neurological:  Negative for dizziness and headaches.  Endo/Heme/Allergies:  Negative for environmental allergies. Does not bruise/bleed easily.       Objective:   Blood pressure 138/72, pulse 96, temperature 97.9 F (36.6 C), resp. rate 20, height 5' 7"$  (1.702 m), weight 218 lb (98.9 kg), SpO2 98 %. Body mass index is 34.14 kg/m.    Physical Exam Vitals reviewed.  Constitutional:      Appearance: Normal appearance. She is well-developed.     Comments: She does appear to have lost some weight.  HENT:     Head: Normocephalic and atraumatic.     Right Ear: Tympanic membrane, ear canal and external ear normal.     Left Ear: Tympanic membrane, ear canal and external ear normal.     Nose: No nasal deformity, septal deviation, mucosal edema or rhinorrhea.     Right Turbinates: Enlarged, swollen and pale.     Left Turbinates: Enlarged, swollen and pale.     Right Sinus: No maxillary sinus tenderness or frontal sinus tenderness.     Left Sinus: No maxillary sinus tenderness or frontal sinus tenderness.     Comments: No nasal polyps.     Mouth/Throat:     Lips: Pink.     Mouth:  Mucous membranes are moist. Mucous membranes are not pale and not dry.     Pharynx: Uvula midline.  Eyes:     General: Lids are normal. No allergic shiner.       Right eye: No discharge.        Left eye: No discharge.     Conjunctiva/sclera: Conjunctivae normal.     Right eye: Right conjunctiva is not injected. No chemosis.    Left eye: Left conjunctiva is not injected. No  chemosis.    Pupils: Pupils are equal, round, and reactive to light.  Cardiovascular:     Rate and Rhythm: Normal rate and regular rhythm.     Heart sounds: Normal heart sounds.  Pulmonary:     Effort: Pulmonary effort is normal. No tachypnea, accessory muscle usage or respiratory distress.     Breath sounds: Examination of the right-upper field reveals wheezing. Examination of the left-upper field reveals wheezing. Examination of the right-middle field reveals wheezing. Examination of the left-middle field reveals wheezing. Examination of the right-lower field reveals wheezing. Examination of the left-lower field reveals wheezing. Wheezing present. No rhonchi or rales.     Comments: Decreased air movement at the bases.  No increased work of breathing.  Able to make full sentences. Chest:     Chest wall: No tenderness.  Lymphadenopathy:     Cervical: No cervical adenopathy.  Skin:    Coloration: Skin is not pale.     Findings: No abrasion, erythema, petechiae or rash. Rash is not papular, urticarial or vesicular.  Neurological:     Mental Status: She is alert.  Psychiatric:        Behavior: Behavior is cooperative.      Diagnostic studies:    Spirometry: results abnormal (FEV1: 2.08/75%, FVC: 2.15/63%, FEV1/FVC: 97%).    Spirometry consistent with possible restrictive disease.   Allergy Studies:   Berna Bue given in the clinic.  She was monitored for 30 minutes.       Salvatore Marvel, MD  [facility]

## 2022-04-07 NOTE — Progress Notes (Unsigned)
Immunotherapy   Patient Details  Name: Haley Goodman MRN: [id number] Date of Birth: 06-16-1980  04/07/2022  Haley Goodman started injections for  Berna Bue Following schedule: Every twenty eight days for three doses then every sixty days after.  Frequency: Every four weeks for three doses then every eight weeks.  Epi-Pen: Not needed Consent signed in office and patient instructions given. Patient waited in the lobby for thirty minutes without an issue.    Julius Bowels 04/07/2022, 2:28 PM

## 2022-04-07 NOTE — Patient Instructions (Addendum)
1. Seasonal and perennial allergic rhinitis - Previous testing showed: grasses, ragweed, weeds, trees, outdoor molds, dust mites, cat, dog, and cockroach. - Continue taking: Allegra (fexofenadine) 1-2 tablets once daily and Singulair (montelukast) 73m daily - Continue taking: Flonase one spray per nostril up to twice daily as needed - You can use an extra dose of the antihistamine, if needed, for breakthrough symptoms.  - Consider nasal saline rinses 1-2 times daily to remove allergens from the nasal cavities as well as help with mucous clearance (this is especially helpful to do before the nasal sprays are given) - Consider allergy shots as a means of long-term control. - Allergy shots "re-train" and "reset" the immune system to ignore environmental allergens and decrease the resulting immune response to those allergens (sneezing, itchy watery eyes, runny nose, nasal congestion, etc).    - Allergy shots improve symptoms in 75-85% of patients.  - It seems that we can avoid allergy shots for now since you are stable.   2. Food intolerance - Continue to avoid peanuts and green peas and soy. - EpiPen refilled today.   3. Gastroesophageal reflux disease - Continue with Nexium 439mtwice daily.   4. Recurrent infections  - Immune work up was normal.   5. Moderate persistent asthma, uncomplicated - Lung testing looked much lower today.  - I think adding on FaBerna Bueould be a great idea to help control your breathing.  - Daily controller medication(s): Trelegy 200/62.5/25 one puff once daily + Fasenra every month x 3 doses (then every 8 weeks thereafter)  - Prior to physical activity: albuterol 2 puffs 10-15 minutes before physical activity. - Rescue medications: albuterol 4 puffs every 4-6 hours as needed and DuoNeb nebulizer one vial every 4-6 hours as needed - Asthma control goals:  * Full participation in all desired activities (may need albuterol before activity) * Albuterol use two time  or less a week on average (not counting use with activity) * Cough interfering with sleep two time or less a month * Oral steroids no more than once a year * No hospitalizations  6. Return in about 3 months (around 07/06/2022). You can see me in [place] this is easier for you!    Please inform usKoreaf any Emergency Department visits, hospitalizations, or changes in symptoms. Call usKoreaefore going to the ED for breathing or allergy symptoms since we might be able to fit you in for a sick visit. Feel free to contact usKoreanytime with any questions, problems, or concerns.  It was a pleasure to see you again today!  Websites that have reliable patient information: 1. American Academy of Asthma, Allergy, and Immunology: www.aaaai.org 2. Food Allergy Research and Education (FARE): foodallergy.org 3. Mothers of Asthmatics: http://www.asthmacommunitynetwork.org 4. American College of Allergy, Asthma, and Immunology: www.acaai.org   COVID-19 Vaccine Information can be found at: [email or web address] questions related to vaccine distribution or appointments, please email [email or web address] or call [phone]  We realize that you might be concerned about having an allergic reaction to the COVID19 vaccines. To help with that concern, WE ARE OFFERING THE COVID19 VACCINES IN OUR OFFICE! Ask the front desk for dates!     "Like" usKorean Facebook and Instagram for our latest updates!      A healthy democracy works best when ALNew York Life Insurancearticipate! Make sure you are registered to vote! If you have moved or changed any of your contact information, you will need to get this updated before voting!  In  some cases, you MAY be able to register to vote online: [email or web address]

## 2022-04-10 NOTE — Progress Notes (Unsigned)
Office Visit Note  Patient: Haley Goodman             Date of Birth: January 17, 1981           MRN: [id number]             PCP: Jolinda Croak, MD Referring: Jolinda Croak, MD Visit Date: 04/11/2022 Occupation: @GUAROCC @  Subjective:  Aching in both hands   History of Present Illness: Haley Goodman is a 42 y.o. female with history of seropositive rheumatoid arthritis, osteoarthritis, and fibromyalgia.  Patient remains on Plaquenil 200 mg 1 tablet by mouth twice daily.  She has been tolerating Plaquenil without any side effects and has not missed any doses recently.  She experiences intermittent aching in both hands with weather changes.  She states her joint pain is typically most severe with cooler weather changes.  She continues to follow-up closely with pain management for management of discomfort in her lower back.  She has been taking Percocet and Flexeril as needed for pain relief.  She states that she takes Motrin when her hands are bothering her which helps with any inflammation.  Overall she has been on Plaquenil to be effective and does not want to make any medication changes at this time.  She has a follow-up visit scheduled with pain management this afternoon.                Activities of Daily Living:  Patient reports morning stiffness for 30 minutes.   Patient Denies nocturnal pain.  Difficulty dressing/grooming: Reports Difficulty climbing stairs: Denies Difficulty getting out of chair: Denies Difficulty using hands for taps, buttons, cutlery, and/or writing: Reports  Review of Systems  Constitutional: Negative.  Negative for fatigue.  HENT: Negative.  Negative for mouth sores and mouth dryness.   Eyes:  Positive for dryness.  Respiratory: Negative.  Negative for shortness of breath.   Cardiovascular: Negative.  Negative for chest pain and palpitations.  Gastrointestinal: Negative.  Negative for blood in stool, constipation and diarrhea.   Endocrine: Negative.  Negative for increased urination.  Genitourinary: Negative.  Negative for involuntary urination.  Musculoskeletal:  Positive for joint pain, joint pain, joint swelling and morning stiffness. Negative for gait problem, myalgias, muscle weakness, muscle tenderness and myalgias.  Skin: Negative.  Negative for color change, rash, hair loss and sensitivity to sunlight.  Allergic/Immunologic: Negative.  Negative for susceptible to infections.  Neurological:  Positive for headaches. Negative for dizziness.  Hematological: Negative.  Negative for swollen glands.  Psychiatric/Behavioral: Negative.  Negative for depressed mood and sleep disturbance. The patient is not nervous/anxious.     PMFS History:  Patient Active Problem List   Diagnosis Date Noted   Subdural hematoma ([place])    Mild neurocognitive disorder due to multiple etiologies 03/22/2021   History of subdural hemorrhage    Migraine headaches    Weakness of right side of body    Ruptured aneurysm of artery    Hypercholesterolemia 03/01/2021   Immunodeficiency due to drugs 03/01/2021   Neck pain 08/24/2020   Osteoarthritis (arthritis due to wear and tear of joints) 04/16/2020   History of colonic polyps 01/02/2020   Allergic rhinitis 07/25/2019   GERD (gastroesophageal reflux disease) 07/25/2019   Endometritis 07/14/2019   Chronic low back pain 06/11/2019   Dry eyes 09/04/2018   Macromastia 09/04/2018   Degeneration of lumbar intervertebral disc 09/04/2018   Chronic anticoagulation 08/07/2017   Pelvic pain in female 04/11/2017   Major depressive disorder  Fibromyalgia 05/10/2016   Chronic pain syndrome 05/10/2016   High risk medication use/ Plaquenil 05/10/2016   Spondylosis of lumbar region without myelopathy or radiculopathy 05/10/2016   Abnormal serum protein electrophoresis 05/10/2016   Vitamin D deficiency 05/10/2016   Rheumatoid arthritis involving multiple sites with positive rheumatoid factor     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   Type 2 diabetes mellitus without complication, with long-term current use of insulin 02/27/2014   Obesity 09/10/2013   Alopecia 09/10/2013   Bipolar disorder in partial remission 09/10/2013   Cerebral infarction 09/10/2013   Pulmonary embolism without acute cor pulmonale ([place]) 03/05/2009   Elevated liver enzymes 03/05/2009   Hypercoagulable state, secondary 02/15/2009   S/P cholecystectomy 02/15/2009   Primary insomnia 04/27/2008   Essential hypertension, benign 03/30/2008   Systemic lupus erythematosus 03/30/2008    Past Medical History:  Diagnosis Date   Abnormal serum protein electrophoresis 05/10/2016   Labs from April 23, 2015, SPEP and M-Spike is negative except 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 Related to Lupus, better on Plaquenil   Anemia due to acute blood loss 02/15/2009   Arthritis    Bipolar disorder in partial remission 09/10/2013   Hx psychosis/ hallucinations, last in 2012   Cerebral infarction 09/10/2013   Chronic anticoagulation 08/07/2017   Chronic low back pain 06/11/2019   Chronic pain syndrome 05/10/2016   Elevated liver enzymes 03/05/2009   Essential hypertension, benign 03/30/2008   Fibromyalgia    GERD (gastroesophageal reflux disease) 07/25/2019   History of colonic polyps 01/02/2020   Negative colonoscopy performed in November 2021   History of subdural hemorrhage    Hypercholesterolemia 03/01/2021   Hypercoagulable state, secondary 02/15/2009   Immunodeficiency due to drugs 03/01/2021   Major depressive disorder    Migraine headaches    Mild neurocognitive  disorder due to multiple etiologies 03/22/2021   Neck pain 08/24/2020   Osteoarthritis (arthritis due to wear and tear of joints) 04/16/2020   PE (pulmonary embolism) 2009   Pelvic pain in female 04/11/2017   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   S/P cholecystectomy 02/15/2009   Spondylosis of lumbar region without myelopathy or radiculopathy 05/10/2016   Subdural hematoma ([facility])    Systemic lupus erythematosus 03/30/2008   scalp skin lesions, on Plaquenil, 2009   Type 2 diabetes mellitus without complication, with long-term current use of insulin 02/27/2014   steroid induced   Upper respiratory tract infection 10/02/2018   Vitamin D deficiency 05/10/2016   Weakness of right side of body     Family History  Problem Relation Age of Onset   Stroke Mother    Cancer Mother    Cancer  Sister    Dementia Maternal Grandmother 52   Cancer Other    Diabetes Other    Dementia Maternal Great-grandmother 68   Pseudochol deficiency Neg Hx    Malignant hyperthermia Neg Hx    Hypotension Neg Hx    Anesthesia problems Neg Hx    Past Surgical History:  Procedure Laterality Date   ABLATION     BRAIN SURGERY     BREAST REDUCTION SURGERY  01/28/2020   Dr. Deforest H[facility] at [facility]   CHOLECYSTECTOMY     DILATION AND CURETTAGE OF UTERUS     HYSTEROSCOPY  12/30/2019   Dr. Rosalia Hammers   TUBAL LIGATION     Social History   Social History Narrative   Not on file    Immunization History  Administered Date(s) Administered   Influenza Split 01/26/2014   Influenza, Seasonal, Injecte, Preservative Fre 12/05/2010, 12/23/2015   Influenza,inj,Quad PF,6+ Mos 12/05/2017   Influenza-Unspecified 03/30/2008, 11/22/2009, 02/11/2013, 12/18/2013, 11/25/2014   Moderna Sars-Covid-2 Vaccination 09/18/2019, 10/16/2019, 03/18/2020   PPD Test 10/03/2017   Pneumococcal Conjugate-13 02/27/2013   Pneumococcal Polysaccharide-23 04/07/2011   Tdap 09/10/2013     Objective: Vital Signs: BP 124/83 (BP Location: Left Arm, Patient Position: Sitting, Cuff Size: Large)   Pulse 76   Resp 16   Ht 5\' 7"  (1.702 m)   Wt 218 lb 9.6 oz (99.2 kg)   BMI 34.24 kg/m    Physical Exam Vitals and nursing note reviewed.  Constitutional:      Appearance: She is well-developed.  HENT:     Head: Normocephalic and atraumatic.  Eyes:     Conjunctiva/sclera: Conjunctivae normal.  Cardiovascular:     Rate and Rhythm: Normal rate and regular rhythm.     Heart sounds: Normal heart sounds.  Pulmonary:     Effort: Pulmonary effort is normal.     Breath sounds: Normal breath sounds.  Abdominal:     General: Bowel sounds are normal.     Palpations: Abdomen is soft.  Musculoskeletal:     Cervical back: Normal range of motion.  Lymphadenopathy:     Cervical: No cervical adenopathy.  Skin:    General: Skin is warm and dry.     Capillary Refill: Capillary refill takes less than 2 seconds.  Neurological:     Mental Status: She is alert and oriented to person, place, and time.  Psychiatric:        Behavior: Behavior normal.      Musculoskeletal Exam: Generalized hyperalgesia and positive tender points on exam.  C-spine has good range of motion.  Painful range of motion of the lumbar spine.  Shoulder joints, elbow joints, wrist joints, MCPs, PIPs, DIPs have good range of motion with no synovitis.  Complete fist formation bilaterally.  Hip joints have good range of motion with no groin  pain.  Knee joints have good range of motion with no warmth or effusion.  Ankle joints have good range of motion with no tenderness or joint swelling.  CDAI Exam: CDAI Score: -- Patient Global: 3 mm; Provider Global: 3 mm Swollen: --; Tender: -- Joint Exam 04/11/2022   No joint exam has been documented for this visit   There is currently no information documented on the homunculus. Go to the Rheumatology activity and complete the homunculus joint exam.  Investigation: No additional findings.  Imaging: No results found.  Recent Labs: Lab Results  Component Value Date   WBC 6.1 04/04/2022   HGB 11.9 04/04/2022   PLT  263 04/04/2022   NA 141 04/04/2022   K 4.0 04/04/2022   CL 107 (H) 04/04/2022   CO2 19 (L) 04/04/2022   GLUCOSE 110 (H) 04/04/2022   BUN 5 (L) 04/04/2022   CREATININE 0.82 04/04/2022   BILITOT 0.2 04/04/2022   ALKPHOS 71 04/04/2022   AST 14 04/04/2022   ALT 11 04/04/2022   PROT 7.1 04/04/2022   ALBUMIN 4.4 04/04/2022   CALCIUM 9.1 04/04/2022   GFRAA 103 06/21/2020    Speciality Comments: PLQ Eye Exam: 01/24/2021 WNL @ [facility] follow up in 1 year  Eye Exam Scheduled for 05/02/2022. VF scheduled for 05/01/2022  Procedures:  No procedures performed Allergies: Codeine, Lacosamide, Peanut oil, Penicillins, Prochlorperazine, Soybean-containing drug products, Sulfa antibiotics, Wheat bran, Cetirizine, Ciprofloxacin, Iodinated contrast media, Lisinopril, Naproxen, Nitrofurantoin, Other, Pregabalin, Compazine, Gabapentin, Lipitor [atorvastatin], Nitrofurantoin monohyd macro, Sulfasalazine, and Dexamethasone   Assessment / Plan:     Visit Diagnoses: Rheumatoid arthritis involving multiple sites with positive rheumatoid factor ([place]): She has no synovitis on examination today.  She experiences intermittent aching in both hands with weather changes.  She has been taking Motrin as needed for intermittent discomfort.  Overall she has clinically been doing  well taking Plaquenil 200 mg 1 tablet by mouth twice daily.  She is tolerating Plaquenil without any side effects and has not missed any doses recently.  She does not want to make any medication changes at this time.  She was advised to notify us if she continues to have increased joint pain or joint swelling.  She will follow-up in the office in 5 months or sooner if needed.  High risk medication use - Plaquenil 200 mg 1 tablet by mouth twice daily.  PLQ Eye Exam: 01/24/2021 WNL @ [facility] follow up in 1 year. She is scheduled for next eye exam on 05/02/22.   CBC and CMP were drawn on 04/04/2022.  Her next lab work will be due in 5 months.  Chronic pain of both shoulders: She has good range of motion about shoulder joints on examination today.  She experiences intermittent discomfort and stiffness in her shoulders especially with cooler weather temperatures.  Primary osteoarthritis of both hands: She experiences intermittent aching in both hands but has no active inflammation on examination today.  Discussed the importance of joint protection and muscle strengthening.  Fibromyalgia: She continues to experience intermittent myalgias and muscle tenderness due to fibromyalgia.  She is followed closely by pain management.  She has been taking Flexeril and Percocet as needed for symptomatic relief.  Discussed the importance of regular exercise and good sleep hygiene.  Trapezius muscle spasm: She experiences trapezius muscle tension and tenderness bilaterally.  History of vitamin D deficiency: She is taking vitamin D 50,000 units once weekly.    Other medical conditions are listed as follows:  Positive PPD  History of pulmonary embolism  History of depression  History of hypertension: Blood pressure was 124/83 today in the office.  Moderate persistent asthma with acute exacerbation  History of psychosis  Orders: No orders of the defined types were placed in this encounter.  No  orders of the defined types were placed in this encounter.     Follow-Up Instructions: Return in about 5 months (around 09/09/2022) for Rheumatoid arthritis, Osteoarthritis, Fibromyalgia.   Ofilia Neas, PA-C  Note - This record has been created using Dragon software.  Chart creation errors have been sought, but may not always  have been located. Such creation errors  do not reflect on  the standard of medical care.

## 2022-04-11 ENCOUNTER — Encounter: Payer: Self-pay | Admitting: Physician Assistant

## 2022-04-11 ENCOUNTER — Ambulatory Visit: Payer: 59 | Attending: Rheumatology | Admitting: Physician Assistant

## 2022-04-11 VITALS — BP 124/83 | HR 76 | Resp 16 | Ht 67.0 in | Wt 218.6 lb

## 2022-04-11 DIAGNOSIS — G8929 Other chronic pain: Secondary | ICD-10-CM

## 2022-04-11 DIAGNOSIS — M0579 Rheumatoid arthritis with rheumatoid factor of multiple sites without organ or systems involvement: Secondary | ICD-10-CM | POA: Diagnosis not present

## 2022-04-11 DIAGNOSIS — M62838 Other muscle spasm: Secondary | ICD-10-CM

## 2022-04-11 DIAGNOSIS — M19041 Primary osteoarthritis, right hand: Secondary | ICD-10-CM

## 2022-04-11 DIAGNOSIS — M25512 Pain in left shoulder: Secondary | ICD-10-CM

## 2022-04-11 DIAGNOSIS — M19042 Primary osteoarthritis, left hand: Secondary | ICD-10-CM

## 2022-04-11 DIAGNOSIS — Z79899 Other long term (current) drug therapy: Secondary | ICD-10-CM

## 2022-04-11 DIAGNOSIS — R7611 Nonspecific reaction to tuberculin skin test without active tuberculosis: Secondary | ICD-10-CM

## 2022-04-11 DIAGNOSIS — Z8659 Personal history of other mental and behavioral disorders: Secondary | ICD-10-CM

## 2022-04-11 DIAGNOSIS — Z8679 Personal history of other diseases of the circulatory system: Secondary | ICD-10-CM

## 2022-04-11 DIAGNOSIS — J4541 Moderate persistent asthma with (acute) exacerbation: Secondary | ICD-10-CM

## 2022-04-11 DIAGNOSIS — M25511 Pain in right shoulder: Secondary | ICD-10-CM | POA: Diagnosis not present

## 2022-04-11 DIAGNOSIS — Z86711 Personal history of pulmonary embolism: Secondary | ICD-10-CM

## 2022-04-11 DIAGNOSIS — Z8639 Personal history of other endocrine, nutritional and metabolic disease: Secondary | ICD-10-CM

## 2022-04-11 DIAGNOSIS — M797 Fibromyalgia: Secondary | ICD-10-CM

## 2022-04-26 ENCOUNTER — Telehealth: Payer: Self-pay | Admitting: *Deleted

## 2022-04-26 NOTE — Telephone Encounter (Signed)
L/m for patient to contact me regrding [facility] start. I had not gotten message regarding start but found when doing billing.

## 2022-05-02 ENCOUNTER — Ambulatory Visit: Payer: 59 | Admitting: Cardiology

## 2022-05-02 NOTE — Telephone Encounter (Signed)
Tried to reach patient again and unable to make contact. Unable to send my chart message also

## 2022-05-03 LAB — HM DIABETES EYE EXAM

## 2022-05-10 ENCOUNTER — Ambulatory Visit: Payer: 59 | Admitting: Cardiology

## 2022-05-10 NOTE — Progress Notes (Deleted)
  Cardiology Office Note:   Date:  05/10/2022  ID:  Haley Goodman, DOB 1980/09/28, MRN [id number]  History of Present Illness:   Haley Goodman is a 42 y.o. female ***  ROS: ***  Studies Reviewed:    EKG:  ***  ***  Risk Assessment/Calculations:   {Does this patient have ATRIAL FIBRILLATION?[phone]} No BP recorded.  {Refresh Note OR Click here to enter BP  :1}***        Physical Exam:   VS:  There were no vitals taken for this visit.   Wt Readings from Last 3 Encounters:  04/11/22 218 lb 9.6 oz (99.2 kg)  04/07/22 218 lb (98.9 kg)  09/22/21 213 lb (96.6 kg)     GEN: Well nourished, well developed in no acute distress NECK: No JVD; No carotid bruits CARDIAC: ***RRR, no murmurs, rubs, gallops RESPIRATORY:  Clear to auscultation without rales, wheezing or rhonchi  ABDOMEN: Soft, non-tender, non-distended EXTREMITIES:  No edema; No deformity   ASSESSMENT AND PLAN:   ***    {Are you ordering a CV Procedure (e.g. stress test, cath, DCCV, TEE, etc)?   Press F2        :[id number]}   Signed, Mariela Rex, NP

## 2022-05-12 ENCOUNTER — Other Ambulatory Visit: Payer: Self-pay | Admitting: Allergy & Immunology

## 2022-05-15 NOTE — Telephone Encounter (Signed)
Spoke to patient and advised approval and submit to [employer] for Berna Bue and will reach out with delivery date to schedule start appt since she wants to get injs in clinic

## 2022-05-16 NOTE — Telephone Encounter (Signed)
Sorry I did not route the last note to you! Thank you for picking up my messes!

## 2022-06-23 ENCOUNTER — Encounter: Payer: Self-pay | Admitting: *Deleted

## 2022-06-23 NOTE — Progress Notes (Deleted)
Cardiology Office Note    Date:  06/23/2022   ID:  Haley Goodman, DOB May 25, 1980, MRN [id number]  PCP:  Stevphen Rochester, MD  Cardiologist:  None  Electrophysiologist:  None   Chief Complaint: ***  History of Present Illness:   Haley Goodman is a 42 y.o. female with history of brain aneurysm/subdural hematoma 2019, prior seizure reported due to brain aneurysm, HTN, HLD, DM, asthma, endometriosis, RA, SLE, fibromyalgia, GERD, PE in 2008, sleep apnea (does not use CPAP), mild LVH, obesity, multiple medication allergies/intolerances who is seen for follow-up.  She has previously been seen by our [facility] and [place] offices as well as [facility] and last seen by [facility] in 04/2021. She was previously on anticoagulation for PE, managed by our Coumadin clinic through 2019 before she established care elsewhere. She is reported to have had a normal dobutamine stress echo 2017. Echo 2019 in [facility] showed EF 60-65%, mild LVH. She was previously said to have chest wall pain. She saw [facility] in 04/2021 for dyslipidemia with prior intolerance to statins and Zetia. Calcium score was recommended, do not see pursued.   Hyperlipidemia Essential HTN Chest pain Mild LVH H/o PE  Labwork independently reviewed:  03/2022 K 4.0, Cr 0.82, AST/ALT wnl, CBC wnl Per notes lipid profile January 2023 total cholesterol 248, triglycerides 177, HDL 61, LDL 155  2021 Mg 1.8  Past History   Past Medical History:  Diagnosis Date   Abnormal serum protein electrophoresis 05/10/2016   Labs from April 23, 2015, SPEP and M-Spike is negative except 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 Overview:  Labs from April 23, 2015, SPEP and M-Spike is negative except she does have polyclonal gammopathy IgA kappa lambda and she is being seen by hematology Formatting of this    Allergic rhinitis 07/25/2019   Alopecia 09/10/2013   ? Related to Lupus, better on Plaquenil   Anemia due to acute blood loss 02/15/2009   Arthritis    Bipolar disorder in partial remission 09/10/2013   Hx psychosis/ hallucinations, last in 2012   Cerebral infarction 09/10/2013   Chronic anticoagulation 08/07/2017   Chronic low back pain 06/11/2019   Chronic pain syndrome 05/10/2016   Elevated liver enzymes 03/05/2009   Essential hypertension, benign 03/30/2008   Fibromyalgia    GERD (gastroesophageal reflux disease) 07/25/2019   History of colonic polyps 01/02/2020   Negative colonoscopy performed in November 2021   History of subdural hemorrhage    Hypercholesterolemia 03/01/2021   Hypercoagulable state, secondary 02/15/2009   Immunodeficiency due to drugs 03/01/2021   Major depressive disorder    Migraine headaches    Mild neurocognitive disorder due to multiple etiologies 03/22/2021   Neck pain 08/24/2020   Osteoarthritis (arthritis due to wear and tear of joints) 04/16/2020   PE (pulmonary embolism) 2009   Pelvic pain in female 04/11/2017   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-sided  weakness   S/P cholecystectomy 02/15/2009   Spondylosis of lumbar region without myelopathy or radiculopathy 05/10/2016   Subdural hematoma (HCC)    Systemic lupus erythematosus 03/30/2008   scalp skin lesions, on Plaquenil, 2009   Type 2 diabetes mellitus without complication, with long-term current  use of insulin 02/27/2014   steroid induced   Upper respiratory tract infection 10/02/2018   Vitamin D deficiency 05/10/2016   Weakness of right side of body     Past Surgical History:  Procedure Laterality Date   ABLATION     BRAIN SURGERY     BREAST REDUCTION SURGERY  01/28/2020   Dr. Deforest Hoyles at [facility]   CHOLECYSTECTOMY     DILATION AND CURETTAGE OF UTERUS     HYSTEROSCOPY  12/30/2019   Dr. Rosalia Hammers   TUBAL LIGATION      Current Medications: No outpatient medications have been marked as taking for the 06/26/22 encounter (Appointment) with Laurann Montana, PA-C.   Current Facility-Administered Medications for the 06/26/22 encounter (Appointment) with Laurann Montana, PA-C  Medication   Benralizumab SOSY 30 mg   ***   Allergies:   Codeine, Lacosamide, Peanut oil, Penicillins, Prochlorperazine, Soybean-containing drug products, Sulfa antibiotics, Wheat, Cetirizine, Ciprofloxacin, Iodinated contrast media, Lisinopril, Naproxen, Nitrofurantoin, Other, Pregabalin, Compazine, Gabapentin, Lipitor [atorvastatin], Nitrofurantoin monohyd macro, Sulfasalazine, and Dexamethasone   Social History   Socioeconomic History   Marital status: Married    Spouse name: Not on file   Number of children: Not on file   Years of education: 14   Highest education level: Some college, no degree  Occupational History   Occupation: Facilities manager    Comment: part-time; previous sleep tech  Tobacco Use   Smoking status: Never    Passive exposure: Never   Smokeless tobacco: Never  Vaping Use   Vaping Use: Never used  Substance and Sexual Activity   Alcohol use: No   Drug use: No   Sexual activity: Not on file  Other Topics Concern   Not on file  Social History Narrative   Not on file   Social Determinants of Health   Financial Resource Strain: Not on file  Food Insecurity: Not on file  Transportation Needs: Not on file  Physical Activity: Not on file  Stress: Not on file   Social Connections: Not on file     Family History:  The patient's ***family history includes Cancer in her mother, sister, and another family member; Dementia (age of onset: 27) in her maternal great-grandmother; Dementia (age of onset: 59) in her maternal grandmother; Diabetes in an other family member; Stroke in her mother. There is no history of Pseudochol deficiency, Malignant hyperthermia, Hypotension, or Anesthesia problems.  ROS:   Please see the history of present illness. Otherwise, review of systems is positive for ***.  All other systems are reviewed and otherwise negative.    EKG(s)/Additional Testing   EKG:  EKG is ordered today, personally reviewed, demonstrating ***  CV Studies: Cardiac studies reviewed are outlined and summarized above. Otherwise please see EMR for full report.  Echo 2019 Interpretation Summary  A complete portable two-dimensional transthoracic echocardiogram with color  flow Doppler and Spectral Doppler was performed. The study was technically  difficult. The left ventricle is normal in size.  There is mild concentric left ventricular hypertrophy.  The left ventricular ejection fraction is normal (60-65%).  The left ventricular wall motion is normal.  The aortic valve is trileaflet.  Right ventricular systolic pressure is normal.  The left ventricular diastolic function is normal.   Left Ventricle  The left ventricle is normal in size. There is mild concentric left  ventricular hypertrophy. The left ventricular ejection fraction is normal  (60-65%). The left ventricular wall motion is normal. The left ventricular  diastolic function is normal.    Right Ventricle  The right ventricle is grossly normal in size and function.   Atria  The left and right atria are normal size.   Mitral Valve  The mitral valve leaflets appear normal. There is no evidence of stenosis,  fluttering, or prolapse. There is no mitral regurgitation.    Tricuspid  Valve  The tricuspid valve is grossly normal. There is trace tricuspid  regurgitation. Right ventricular systolic pressure is normal.   Aortic Valve  The aortic valve is trileaflet. There is no aortic regurgitation present.   Pulmonic Valve  The pulmonic valve is not well seen, but is grossly normal. There is no  pulmonic regurgitation.   Vessels  The aortic root is normal in diameter.   Pericardium  There is no pericardial effusion.    Recent Labs: 04/04/2022: ALT 11; BUN 5; Creatinine, Ser 0.82; Hemoglobin 11.9; Platelets 263; Potassium 4.0; Sodium 141  Recent Lipid Panel No results found for: "CHOL", "TRIG", "HDL", "CHOLHDL", "VLDL", "LDLCALC", "LDLDIRECT"  PHYSICAL EXAM:    VS:  There were no vitals taken for this visit.  BMI: There is no height or weight on file to calculate BMI.  GEN: Well nourished, well developed female in no acute distress HEENT: normocephalic, atraumatic Neck: no JVD, carotid bruits, or masses Cardiac: ***RRR; no murmurs, rubs, or gallops, no edema  Respiratory:  clear to auscultation bilaterally, normal work of breathing GI: soft, nontender, nondistended, + BS MS: no deformity or atrophy Skin: warm and dry, no rash Neuro:  Alert and Oriented x 3, Strength and sensation are intact, follows commands Psych: euthymic mood, full affect  Wt Readings from Last 3 Encounters:  04/11/22 218 lb 9.6 oz (99.2 kg)  04/07/22 218 lb (98.9 kg)  09/22/21 213 lb (96.6 kg)     ASSESSMENT & PLAN:   ***     Disposition: F/u with ***   Medication Adjustments/Labs and Tests Ordered: Current medicines are reviewed at length with the patient today.  Concerns regarding medicines are outlined above. Medication changes, Labs and Tests ordered today are summarized above and listed in the Patient Instructions accessible in Encounters.    Signed, Laurann Montana, PA-C  06/23/2022 2:22 PM    [facility] in [facility] [address] [place], [place] [address] Ph: [phone]; Fax [phone]

## 2022-06-26 ENCOUNTER — Ambulatory Visit: Payer: 59 | Admitting: Physician Assistant

## 2022-06-26 DIAGNOSIS — R079 Chest pain, unspecified: Secondary | ICD-10-CM

## 2022-06-26 DIAGNOSIS — E785 Hyperlipidemia, unspecified: Secondary | ICD-10-CM

## 2022-06-26 DIAGNOSIS — I517 Cardiomegaly: Secondary | ICD-10-CM

## 2022-06-26 DIAGNOSIS — Z86711 Personal history of pulmonary embolism: Secondary | ICD-10-CM

## 2022-06-26 DIAGNOSIS — I10 Essential (primary) hypertension: Secondary | ICD-10-CM

## 2022-07-05 NOTE — Telephone Encounter (Signed)
Patient never responded to calls or my chart to give ok to ship [place]

## 2022-07-07 ENCOUNTER — Ambulatory Visit: Payer: 59 | Admitting: Allergy & Immunology

## 2022-07-11 ENCOUNTER — Other Ambulatory Visit: Payer: Self-pay

## 2022-07-11 ENCOUNTER — Ambulatory Visit (INDEPENDENT_AMBULATORY_CARE_PROVIDER_SITE_OTHER): Payer: 59 | Admitting: Allergy & Immunology

## 2022-07-11 ENCOUNTER — Ambulatory Visit: Payer: 59

## 2022-07-11 ENCOUNTER — Encounter: Payer: Self-pay | Admitting: Allergy & Immunology

## 2022-07-11 VITALS — BP 120/80 | HR 80 | Temp 98.2°F | Ht 67.0 in | Wt 215.5 lb

## 2022-07-11 DIAGNOSIS — K9049 Malabsorption due to intolerance, not elsewhere classified: Secondary | ICD-10-CM

## 2022-07-11 DIAGNOSIS — B999 Unspecified infectious disease: Secondary | ICD-10-CM

## 2022-07-11 DIAGNOSIS — J302 Other seasonal allergic rhinitis: Secondary | ICD-10-CM

## 2022-07-11 DIAGNOSIS — K219 Gastro-esophageal reflux disease without esophagitis: Secondary | ICD-10-CM | POA: Diagnosis not present

## 2022-07-11 DIAGNOSIS — J4540 Moderate persistent asthma, uncomplicated: Secondary | ICD-10-CM

## 2022-07-11 DIAGNOSIS — J3089 Other allergic rhinitis: Secondary | ICD-10-CM | POA: Diagnosis not present

## 2022-07-11 MED ORDER — ESOMEPRAZOLE MAGNESIUM 40 MG PO CPDR: 40.0000 mg | DELAYED_RELEASE_CAPSULE | Freq: Every day | ORAL | 1 refills | Status: DC

## 2022-07-11 MED ORDER — FEXOFENADINE HCL 180 MG PO TABS: 180.0000 mg | ORAL_TABLET | Freq: Every day | ORAL | 1 refills | Status: DC

## 2022-07-11 MED ORDER — FLUTICASONE PROPIONATE 50 MCG/ACT NA SUSP: 2.0000 | Freq: Every day | NASAL | 1 refills | Status: DC

## 2022-07-11 MED ORDER — ALBUTEROL SULFATE HFA 108 (90 BASE) MCG/ACT IN AERS: 2.0000 | INHALATION_SPRAY | Freq: Four times a day (QID) | RESPIRATORY_TRACT | 2 refills | Status: DC | PRN

## 2022-07-11 MED ORDER — MONTELUKAST SODIUM 10 MG PO TABS: 10.0000 mg | ORAL_TABLET | Freq: Every day | ORAL | 1 refills | Status: DC

## 2022-07-11 NOTE — Patient Instructions (Addendum)
1. Seasonal and perennial allergic rhinitis - Previous testing showed: grasses, ragweed, weeds, trees, outdoor molds, dust mites, cat, dog, and cockroach. - Continue taking: Allegra (fexofenadine) 1-2 tablets once daily and Singulair (montelukast) 10mg  daily - Continue taking: Flonase one spray per nostril up to twice daily as needed - You can use an extra dose of the antihistamine, if needed, for breakthrough symptoms.  - Consider nasal saline rinses 1-2 times daily to remove allergens from the nasal cavities as well as help with mucous clearance (this is especially helpful to do before the nasal sprays are given) - Consider allergy shots as a means of long-term control. - Allergy shots "re-train" and "reset" the immune system to ignore environmental allergens and decrease the resulting immune response to those allergens (sneezing, itchy watery eyes, runny nose, nasal congestion, etc).    - Allergy shots improve symptoms in 75-85% of patients.   2. Food intolerance - Continue to avoid peanuts and green peas and soy. - EpiPen refilled today.   3. Gastroesophageal reflux disease - Continue with Nexium 40mg  twice daily.   4. Recurrent infections  - Immune work up was normal.   5. Moderate persistent asthma, uncomplicated - Lung testing looked much lower today.  Harrington Challenger started today.  - We are sorry about the confusion!  - Daily controller medication(s): Trelegy 200/62.5/25 one puff once daily + Fasenra every month x 3 doses (then every 8 weeks thereafter)  - Prior to physical activity: albuterol 2 puffs 10-15 minutes before physical activity. - Rescue medications: albuterol 4 puffs every 4-6 hours as needed and DuoNeb nebulizer one vial every 4-6 hours as needed - Asthma control goals:  * Full participation in all desired activities (may need albuterol before activity) * Albuterol use two time or less a week on average (not counting use with activity) * Cough interfering with sleep two  time or less a month * Oral steroids no more than once a year * No hospitalizations  6. Return in about 4 months (around 11/11/2022).    Please inform us of any Emergency Department visits, hospitalizations, or changes in symptoms. Call us before going to the ED for breathing or allergy symptoms since we might be able to fit you in for a sick visit. Feel free to contact us anytime with any questions, problems, or concerns.  It was a pleasure to see you again today!  Websites that have reliable patient information: 1. American Academy of Asthma, Allergy, and Immunology: www.aaaai.org 2. Food Allergy Research and Education (FARE): foodallergy.org 3. Mothers of Asthmatics: http://www.asthmacommunitynetwork.org 4. American College of Allergy, Asthma, and Immunology: www.acaai.org   COVID-19 Vaccine Information can be found at: [email or web address] For questions related to vaccine distribution or appointments, please email [email or web address] or call [phone].   We realize that you might be concerned about having an allergic reaction to the COVID19 vaccines. To help with that concern, WE ARE OFFERING THE COVID19 VACCINES IN OUR OFFICE! Ask the front desk for dates!     "Like" [place] on Facebook and Instagram for our latest updates!      A healthy democracy works best when [employer] participate! Make sure you are registered to vote! If you have moved or changed any of your contact information, you will need to get this updated before voting!  In some cases, you MAY be able to register to vote online: [email or web address]

## 2022-07-11 NOTE — Progress Notes (Signed)
FOLLOW UP  Date of Service/Encounter:  07/11/22   Assessment:   Seasonal and perennial allergic rhinitis (grasses, ragweed, weeds, trees, outdoor molds, dust mites, cat, dog, and cockroach)   Food intolerance (peanuts and green peas and soy)   Gastroesophageal reflux disease   Recurrent infections    Moderate persistent asthma, uncomplicated - with eosinophilic phenotype (AEC 300 in February 2024)   Recurrent prednisone use - with multiple complications  Plan/Recommendations:   1. Seasonal and perennial allergic rhinitis - Previous testing showed: grasses, ragweed, weeds, trees, outdoor molds, dust mites, cat, dog, and cockroach. - Continue taking: Allegra (fexofenadine) 1-2 tablets once daily and Singulair (montelukast) 10mg  daily - Continue taking: Flonase one spray per nostril up to twice daily as needed - You can use an extra dose of the antihistamine, if needed, for breakthrough symptoms.  - Consider nasal saline rinses 1-2 times daily to remove allergens from the nasal cavities as well as help with mucous clearance (this is especially helpful to do before the nasal sprays are given) - Consider allergy shots as a means of long-term control. - Allergy shots "re-train" and "reset" the immune system to ignore environmental allergens and decrease the resulting immune response to those allergens (sneezing, itchy watery eyes, runny nose, nasal congestion, etc).    - Allergy shots improve symptoms in 75-85% of patients.   2. Food intolerance - Continue to avoid peanuts and green peas and soy. - EpiPen refilled today.   3. Gastroesophageal reflux disease - Continue with Nexium 40mg  twice daily.   4. Recurrent infections  - Immune work up was normal.   5. Moderate persistent asthma, uncomplicated - Lung testing looked much lower today.  Harrington Challenger started today.  - We are sorry about the confusion!  - Daily controller medication(s): Trelegy 200/62.5/25 one puff once daily  + Fasenra every month x 3 doses (then every 8 weeks thereafter)  - Prior to physical activity: albuterol 2 puffs 10-15 minutes before physical activity. - Rescue medications: albuterol 4 puffs every 4-6 hours as needed and DuoNeb nebulizer one vial every 4-6 hours as needed - Asthma control goals:  * Full participation in all desired activities (may need albuterol before activity) * Albuterol use two time or less a week on average (not counting use with activity) * Cough interfering with sleep two time or less a month * Oral steroids no more than once a year * No hospitalizations  6. Return in about 4 months (around 11/11/2022).    Subjective:   Haley Goodman is a 42 y.o. female presenting today for follow up of  Chief Complaint  Patient presents with   Follow-up    No concerns new start 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   History of subdural hemorrhage    Migraine headaches    Weakness of right side of body    Ruptured aneurysm of artery    Hypercholesterolemia 03/01/2021   Immunodeficiency due to drugs 03/01/2021   Neck pain 08/24/2020   Osteoarthritis (arthritis due to wear and tear of joints) 04/16/2020   History of colonic polyps 01/02/2020   Allergic rhinitis 07/25/2019   GERD (gastroesophageal reflux disease) 07/25/2019   Endometritis 07/14/2019   Chronic low back pain 06/11/2019   Dry eyes 09/04/2018   Macromastia 09/04/2018   Degeneration of lumbar  intervertebral disc 09/04/2018   Chronic anticoagulation 08/07/2017   Pelvic pain in female 04/11/2017   Major depressive disorder    Fibromyalgia 05/10/2016   Chronic pain syndrome 05/10/2016   High risk medication use/ Plaquenil 05/10/2016   Spondylosis of lumbar region without myelopathy or radiculopathy 05/10/2016   Abnormal  serum protein electrophoresis 05/10/2016   Vitamin D deficiency 05/10/2016   Rheumatoid arthritis involving multiple sites with positive rheumatoid factor    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  Haley Goodman is a 42 y.o. female presenting for a follow up visit.  She was last seen in February 2024.  At that time, we continue with Allegra and Singulair as well as Flonase.  We did talk about allergy shots for long-term control.  For her peanut and green pea and soy allergy, we recommended continued avoidance.  EpiPen was refilled.  We continued with Nexium 40 mg twice daily for her GERD.  For her asthma, we continue with Trelegy and started her on Fasenra.  Since the last visit, she has done well.   Asthma/Respiratory Symptom History: She remains on her Trelegy one puff once daily.  She has been doing very well with this. She remains on the montelukast as well. She has not used albuterol in quite some time. She got COVID 3 times now, which seems to have made her asthma a lot worse.  Her last episode of COVID-19 was in January 2024.  He has not needed hospitalization for any of these infections.  She did feel like the Harrington Challenger helped her when she got a sample back in February.  She is excited to get back on this.   [place]a's asthma has been well controlled. She has not required rescue medication, experienced nocturnal awakenings due to  lower respiratory symptoms, nor have activities of daily living been limited. She has required no Emergency Department or Urgent Care visits for her asthma. She has required one course of systemic steroids for asthma exacerbations since the last visit. ACT score today is 20, indicating subpar asthma symptom control.   She says that she did talk to Tammy who recommended that she call [facility].  She tells me today that everything should be sorted out, although there is some confusion as to where her injection is.  I did call [facility] and confirm that it was shipped at the end of April and arrived to our office on May 3.  We did find the finger on her injection was [place].  We ended up giving her a sample here in the office today.  Allergic Rhinitis Symptom History: She tells me that the pollen has been messing everything up. She is on the allegra and the montelukast. She also has some OTC allergy drops for the itching in her eyes. She does not feel that it is working as well as it could.  She does not have any animals at all.  She has not had any infections at all since last time we saw her in  clinic.  Food Allergy Symptom History: She continues to avoid peanuts as well as green peas and soy.  Her EpiPen is up-to-date.  GERD Symptom History: Reflux is under good control with use of Nexium 40 mg twice daily.  One of her patients passed away. 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   Otherwise, there have been no changes to her past medical history, surgical history, family history, or social history.    Review of Systems  Constitutional:  Negative for chills, fever, malaise/fatigue and weight loss.  HENT:  Negative for congestion, ear discharge, ear pain and sinus pain.   Eyes:  Negative for pain, discharge and redness.  Respiratory:  Positive for cough. Negative for sputum production, shortness of  breath and wheezing.   Cardiovascular: Negative.  Negative for chest pain and palpitations.  Gastrointestinal:  Negative for abdominal pain, constipation, diarrhea, heartburn, nausea and vomiting.  Skin: Negative.  Negative for itching and rash.  Neurological:  Negative for dizziness and headaches.  Endo/Heme/Allergies:  Negative for environmental allergies. Does not bruise/bleed easily.       Objective:   Blood pressure 120/80, pulse 80, temperature 98.2 F (36.8 C), height 5\' 7"  (1.702 m), weight 215 lb 8 oz (97.8 kg), SpO2 100 %. Body mass index is 33.75 kg/m.    Physical Exam Vitals reviewed.  Constitutional:      Appearance: Normal appearance. She is well-developed.     Comments: Pleasant.  Cooperative with the exam.  HENT:     Head: Normocephalic and atraumatic.     Right Ear: Tympanic membrane, ear canal and external ear normal.     Left Ear: Tympanic membrane, ear canal and external ear normal.     Nose: No nasal deformity, septal deviation, mucosal edema or rhinorrhea.     Right Turbinates: Enlarged, swollen and pale.     Left Turbinates: Enlarged, swollen and pale.     Right Sinus: No maxillary sinus tenderness or frontal sinus tenderness.     Left Sinus: No maxillary sinus tenderness or frontal sinus tenderness.     Comments: No nasal polyps.     Mouth/Throat:     Lips: Pink.     Mouth: Mucous membranes are moist. Mucous membranes are not pale and not dry.     Pharynx: Uvula midline.  Eyes:     General: Lids are normal. No allergic shiner.       Right eye: No discharge.        Left eye: No discharge.     Conjunctiva/sclera: Conjunctivae normal.     Right eye: Right conjunctiva is not injected. No chemosis.    Left eye: Left conjunctiva is not injected. No chemosis.    Pupils: Pupils are equal, round, and reactive to light.  Cardiovascular:     Rate and Rhythm: Normal rate and regular rhythm.     Heart sounds: Normal heart sounds.  Pulmonary:     Effort:  Pulmonary effort is normal. No tachypnea, accessory muscle usage or respiratory distress.     Breath sounds: No wheezing, rhonchi or rales.     Comments: Moving air well in all lung fields.  No increased work of breathing. Chest:     Chest wall: No tenderness.  Lymphadenopathy:     Cervical: No cervical adenopathy.  Skin:    Coloration: Skin is not pale.  Findings: No abrasion, erythema, petechiae or rash. Rash is not papular, urticarial or vesicular.  Neurological:     Mental Status: She is alert.  Psychiatric:        Behavior: Behavior is cooperative.      Diagnostic studies:    Spirometry: results normal (FEV1: 2.39/86%, FVC: 2.81/82%, FEV1/FVC: 85%).    Spirometry consistent with normal pattern.   Allergy Studies: none   We did give her a sample of Fasenra.  She tolerated this without adverse event.      Malachi Bonds, MD  [facility]

## 2022-07-13 NOTE — Addendum Note (Signed)
Addended by: Philipp Deputy on: 07/13/2022 06:20 PM   Modules accepted: Orders

## 2022-08-07 ENCOUNTER — Ambulatory Visit: Payer: 59

## 2022-08-10 ENCOUNTER — Other Ambulatory Visit: Payer: Self-pay | Admitting: Rheumatology

## 2022-08-10 DIAGNOSIS — M0579 Rheumatoid arthritis with rheumatoid factor of multiple sites without organ or systems involvement: Secondary | ICD-10-CM

## 2022-08-10 NOTE — Telephone Encounter (Signed)
Last Fill: 04/05/2022  Eye exam: 05/03/2022 Anemia noted which is a stable.  Patient should take multivitamin with iron.  Glucose is mildly elevated, probably not a fasting sample.  Please forward results to her PCP.   Labs: 04/04/2022   Next Visit: 09/06/2022  Last Visit: 04/11/2022  [id number] arthritis involving multiple sites with positive rheumatoid factor   Current Dose per office note 04/11/2022: Plaquenil 200 mg 1 tablet by mouth twice daily.   Okay to refill Plaquenil?

## 2022-08-24 NOTE — Progress Notes (Unsigned)
Office Visit Note  Patient: Haley Goodman             Date of Birth: 1980/08/11           MRN: [id number]             PCP: Stevphen Rochester, MD Referring: Stevphen Rochester, MD Visit Date: 09/06/2022 Occupation: @GUAROCC @  Subjective:  Medication monitoring   History of Present Illness: Haley Goodman is a 42 y.o. female with history of seropositive rheumatoid arthritis, osteoarthritis, and fibromyalgia.  Patient remains on Plaquenil 200 mg 1 tablet by mouth twice daily.  She is tolerating Plaquenil without any side effects and has not missed any doses recently.  She denies any signs or symptoms of a rheumatoid arthritis flare.  She states that her rheumatoid arthritis is well-controlled during the summer months.  She states she has occasional fibromyalgia flares which she attributes to rainy weather.  She denies any joint swelling at this time.  Patient states that she had bilateral ear infection after having COVID in February 2024.  She states that she was treated with 2 courses of antibiotics but has had hearing loss in her left ear.  She is currently awaiting a referral to ENT for further evaluation.   Patient states that she has also noticed a rash along her right heel.  She has been using Vicks vapor rub as well as tried hydrocortisone cream.  She states that the rash improved as well as the itching but has not completely resolved.  She denies any other new medical conditions at this time.   Activities of Daily Living:  Patient reports morning stiffness for 10 minutes.   Patient Reports nocturnal pain.  Difficulty dressing/grooming: Denies Difficulty climbing stairs: Denies Difficulty getting out of chair: Denies Difficulty using hands for taps, buttons, cutlery, and/or writing: Reports  Review of Systems  Constitutional:  Positive for fatigue.  HENT:  Positive for hearing loss and mouth dryness. Negative for mouth sores.   Eyes:  Positive for dryness.   Respiratory:  Negative for shortness of breath.   Cardiovascular:  Negative for chest pain and palpitations.  Gastrointestinal:  Negative for blood in stool, constipation and diarrhea.  Endocrine: Negative for increased urination.  Genitourinary:  Negative for involuntary urination.  Musculoskeletal:  Positive for joint pain, joint pain and morning stiffness. Negative for gait problem, joint swelling, myalgias, muscle weakness, muscle tenderness and myalgias.  Skin:  Positive for rash. Negative for color change, hair loss and sensitivity to sunlight.  Allergic/Immunologic: Positive for susceptible to infections.  Neurological:  Negative for dizziness and headaches.  Hematological:  Negative for swollen glands.  Psychiatric/Behavioral:  Negative for depressed mood and sleep disturbance. The patient is not nervous/anxious.     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   History of subdural hemorrhage    Migraine headaches    Weakness of right side of body    Ruptured aneurysm of artery    Hypercholesterolemia 03/01/2021   Immunodeficiency due to drugs 03/01/2021   Neck pain 08/24/2020   Osteoarthritis (arthritis due to wear and tear of joints) 04/16/2020   History of colonic polyps 01/02/2020   Allergic rhinitis 07/25/2019   GERD (gastroesophageal reflux disease) 07/25/2019   Endometritis 07/14/2019   Chronic low back pain 06/11/2019   Dry eyes 09/04/2018   Macromastia 09/04/2018   Degeneration of lumbar  intervertebral disc 09/04/2018   Chronic anticoagulation 08/07/2017   Pelvic pain in female 04/11/2017   Major depressive disorder    Fibromyalgia 05/10/2016   Chronic pain syndrome 05/10/2016   High risk medication use/ Plaquenil 05/10/2016   Spondylosis of lumbar region without myelopathy or radiculopathy 05/10/2016   Abnormal serum protein electrophoresis  05/10/2016   Vitamin D deficiency 05/10/2016   Rheumatoid arthritis involving multiple sites with positive rheumatoid factor    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   Type 2 diabetes mellitus without complication, with long-term current use of insulin 02/27/2014   Obesity 09/10/2013   Alopecia 09/10/2013   Bipolar disorder in partial remission 09/10/2013   Cerebral infarction 09/10/2013   Pulmonary embolism without acute cor pulmonale (HCC) 03/05/2009   Elevated liver enzymes 03/05/2009   Hypercoagulable state, secondary 02/15/2009   S/P cholecystectomy 02/15/2009   Primary insomnia 04/27/2008   Essential hypertension, benign 03/30/2008   Systemic lupus erythematosus 03/30/2008    Past Medical History:  Diagnosis Date   Abnormal serum protein electrophoresis 05/10/2016   Labs from April 23, 2015, SPEP and M-Spike is negative except 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 Related to Lupus, better on Plaquenil   Anemia due to acute blood loss 02/15/2009   Arthritis    Bipolar disorder in partial remission 09/10/2013   Hx psychosis/ hallucinations, last in 2012   Cerebral infarction 09/10/2013   Chronic anticoagulation 08/07/2017   Chronic low back pain 06/11/2019   Chronic pain syndrome 05/10/2016   Elevated liver enzymes 03/05/2009   Essential hypertension, benign 03/30/2008   Fibromyalgia    GERD (gastroesophageal reflux disease) 07/25/2019   History of colonic polyps 01/02/2020   Negative colonoscopy performed in November 2021   History of subdural hemorrhage    Hypercholesterolemia 03/01/2021   Hypercoagulable state, secondary  02/15/2009   Immunodeficiency due to drugs 03/01/2021   Major depressive disorder    Migraine headaches    Mild neurocognitive disorder due to multiple etiologies 03/22/2021   Neck pain 08/24/2020   Osteoarthritis (arthritis due to wear and tear of joints) 04/16/2020   PE (pulmonary embolism) 2009   Pelvic pain in female 04/11/2017   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   S/P cholecystectomy 02/15/2009   Spondylosis of lumbar region without myelopathy or radiculopathy 05/10/2016   Subdural hematoma (HCC)    Systemic lupus erythematosus 03/30/2008   scalp skin lesions, on Plaquenil, 2009   Type 2 diabetes mellitus without complication, with long-term current use of insulin 02/27/2014   steroid induced   Upper respiratory tract infection 10/02/2018   Vitamin D deficiency 05/10/2016   Weakness of right side of body  Family History  Problem Relation Age of Onset   Stroke Mother    Cancer Mother    Cancer Sister    Dementia Maternal Grandmother 68   Cancer Other    Diabetes Other    Dementia Maternal Great-grandmother 69   Pseudochol deficiency Neg Hx    Malignant hyperthermia Neg Hx    Hypotension Neg Hx    Anesthesia problems Neg Hx    Past Surgical History:  Procedure Laterality Date   ABLATION     BRAIN SURGERY     BREAST REDUCTION SURGERY  01/28/2020   Dr. Deforest Hoyles at [facility]   CHOLECYSTECTOMY     DILATION AND CURETTAGE OF UTERUS      HYSTEROSCOPY  12/30/2019   Dr. Rosalia Hammers   TUBAL LIGATION     Social History   Social History Narrative   Not on file   Immunization History  Administered Date(s) Administered   Influenza Split 01/26/2014   Influenza, Seasonal, Injecte, Preservative Fre 12/05/2010, 12/23/2015   Influenza,inj,Quad PF,6+ Mos 12/05/2017   Influenza-Unspecified 03/30/2008, 11/22/2009, 02/11/2013, 12/18/2013, 11/25/2014   Moderna Sars-Covid-2 Vaccination 09/18/2019, 10/16/2019, 03/18/2020   PPD Test 10/03/2017   Pneumococcal Conjugate-13 02/27/2013   Pneumococcal Polysaccharide-23 04/07/2011   Tdap 09/10/2013     Objective: Vital Signs: BP 129/86 (BP Location: Left Arm, Patient Position: Sitting, Cuff Size: Large)   Pulse 76   Resp 16   Ht 5\' 7"  (1.702 m)   Wt 213 lb (96.6 kg)   BMI 33.36 kg/m    Physical Exam Vitals and nursing note reviewed.  Constitutional:      Appearance: She is well-developed.  HENT:     Head: Normocephalic and atraumatic.  Eyes:     Conjunctiva/sclera: Conjunctivae normal.  Cardiovascular:     Rate and Rhythm: Normal rate and regular rhythm.     Heart sounds: Normal heart sounds.  Pulmonary:     Effort: Pulmonary effort is normal.     Breath sounds: Normal breath sounds.  Abdominal:     General: Bowel sounds are normal.     Palpations: Abdomen is soft.  Musculoskeletal:     Cervical back: Normal range of motion.  Skin:    General: Skin is warm and dry.     Capillary Refill: Capillary refill takes less than 2 seconds.     Comments: Faint scaling noted on the lateral aspect of the right heel   Neurological:     Mental Status: She is alert and oriented to person, place, and time.  Psychiatric:        Behavior: Behavior normal.      Musculoskeletal Exam: C-spine, thoracic spine, lumbar spine.  Trapezius muscle tension tenderness bilaterally.  Shoulder joints, elbow joints, wrist joints, MCPs, PIPs, DIPs have good range of motion with no synovitis.  Complete fist  formation bilaterally.  Hip joints have good range of motion with no groin pain.  Knee joints have good range of motion with no warmth or effusion.  Ankle joints have good range of motion with no tenderness or joint swelling.  CDAI Exam: CDAI Score: -- Patient Global: 20 / 100; Provider Global: 20 / 100 Swollen: --; Tender: -- Joint Exam 09/06/2022   No joint exam has been documented for this visit   There is currently no information documented on the homunculus. Go to the Rheumatology activity and complete the homunculus joint exam.  Investigation: No additional findings.  Imaging: No results found.  Recent Labs: Lab Results  Component  Value Date   WBC 6.1 04/04/2022   HGB 11.9 04/04/2022   PLT 263 04/04/2022   NA 141 04/04/2022   K 4.0 04/04/2022   CL 107 (H) 04/04/2022   CO2 19 (L) 04/04/2022   GLUCOSE 110 (H) 04/04/2022   BUN 5 (L) 04/04/2022   CREATININE 0.82 04/04/2022   BILITOT 0.2 04/04/2022   ALKPHOS 71 04/04/2022   AST 14 04/04/2022   ALT 11 04/04/2022   PROT 7.1 04/04/2022   ALBUMIN 4.4 04/04/2022   CALCIUM 9.1 04/04/2022   GFRAA 103 06/21/2020    Speciality Comments: PLQ Eye Exam: 05/03/2022 WNL @ [facility] follow up in 1 year  Procedures:  No procedures performed Allergies: Lacosamide, Peanut oil, Penicillins, Prochlorperazine, Soybean-containing drug products, Sulfa antibiotics, Wheat, Cetirizine, Ciprofloxacin, Iodinated contrast media, Lisinopril, Naproxen, Nitrofurantoin, Other, Pregabalin, Codeine, Compazine, Gabapentin, Lipitor [atorvastatin], Nitrofurantoin monohyd macro, Sulfasalazine, and Dexamethasone    Assessment / Plan:     Visit Diagnoses: Rheumatoid arthritis involving multiple sites with positive rheumatoid factor (HCC): She has no joint tenderness or synovitis on examination today.  She has not had any signs or symptoms of a rheumatoid arthritis flare.  She is clinically doing well taking Plaquenil 200 mg 1  tablet by mouth twice daily.  She is tolerating Plaquenil without any side effects and has not missed any doses recently.  Her rheumatoid arthritis has been well-controlled especially during the summer months.  She will remain on Plaquenil as monotherapy.  She was advised to notify us if she develops signs or symptoms of a flare.  She will follow-up in the office in 5 months or sooner if needed.  High risk medication use - Plaquenil 200 mg 1 tablet by mouth twice daily.  PLQ Eye Exam: 05/03/2022 WNL @ Dr[facility] follow up in 1 year.   CBC and CMP updated on 04/04/22.  Orders for CBC and CMP released today.  - Plan: CBC with Differential/Platelet, COMPLETE METABOLIC PANEL WITH GFR  Chronic pain of both shoulders: Good range of motion of both shoulder joints with no discomfort on exam.  Primary osteoarthritis of both hands: No tenderness or inflammation noted on examination today.  Complete fist formation bilaterally.  Fibromyalgia: Patient has occasional myalgias and muscle tenderness due to fibromyalgia.  Her symptoms are typically exacerbated by weather changes.  Discussed the importance of regular exercise and good sleep hygiene.  Trapezius muscle spasm: She is not experiencing any muscle spasms currently.   Other medical conditions are listed as follows:   History of vitamin D deficiency  Positive PPD  History of pulmonary embolism  History of depression  History of hypertension  Moderate persistent asthma with acute exacerbation  History of psychosis  Orders: Orders Placed This Encounter  Procedures   CBC with Differential/Platelet   COMPLETE METABOLIC PANEL WITH GFR   No orders of the defined types were placed in this encounter.   Follow-Up Instructions: Return in about 5 months (around 02/06/2023) for Rheumatoid arthritis, Osteoarthritis, Inflammatory polyarthritis, Fibromyalgia.   Gearldine Bienenstock, PA-C  Note - This record has been created using  Dragon software.  Chart creation errors have been sought, but may not always  have been located. Such creation errors do not reflect on  the standard of medical care.

## 2022-09-06 ENCOUNTER — Ambulatory Visit: Payer: 59 | Attending: Physician Assistant | Admitting: Physician Assistant

## 2022-09-06 ENCOUNTER — Encounter: Payer: Self-pay | Admitting: Physician Assistant

## 2022-09-06 VITALS — BP 129/86 | HR 76 | Resp 16 | Ht 67.0 in | Wt 213.0 lb

## 2022-09-06 DIAGNOSIS — M0579 Rheumatoid arthritis with rheumatoid factor of multiple sites without organ or systems involvement: Secondary | ICD-10-CM | POA: Diagnosis not present

## 2022-09-06 DIAGNOSIS — Z86711 Personal history of pulmonary embolism: Secondary | ICD-10-CM

## 2022-09-06 DIAGNOSIS — J4541 Moderate persistent asthma with (acute) exacerbation: Secondary | ICD-10-CM

## 2022-09-06 DIAGNOSIS — M19041 Primary osteoarthritis, right hand: Secondary | ICD-10-CM

## 2022-09-06 DIAGNOSIS — Z8639 Personal history of other endocrine, nutritional and metabolic disease: Secondary | ICD-10-CM

## 2022-09-06 DIAGNOSIS — M25511 Pain in right shoulder: Secondary | ICD-10-CM | POA: Diagnosis not present

## 2022-09-06 DIAGNOSIS — Z79899 Other long term (current) drug therapy: Secondary | ICD-10-CM | POA: Diagnosis not present

## 2022-09-06 DIAGNOSIS — R7611 Nonspecific reaction to tuberculin skin test without active tuberculosis: Secondary | ICD-10-CM

## 2022-09-06 DIAGNOSIS — G8929 Other chronic pain: Secondary | ICD-10-CM

## 2022-09-06 DIAGNOSIS — M62838 Other muscle spasm: Secondary | ICD-10-CM

## 2022-09-06 DIAGNOSIS — M797 Fibromyalgia: Secondary | ICD-10-CM

## 2022-09-06 DIAGNOSIS — Z8679 Personal history of other diseases of the circulatory system: Secondary | ICD-10-CM

## 2022-09-06 DIAGNOSIS — M25512 Pain in left shoulder: Secondary | ICD-10-CM

## 2022-09-06 DIAGNOSIS — M19042 Primary osteoarthritis, left hand: Secondary | ICD-10-CM

## 2022-09-06 DIAGNOSIS — Z8659 Personal history of other mental and behavioral disorders: Secondary | ICD-10-CM

## 2022-09-07 LAB — COMPLETE METABOLIC PANEL WITH GFR
AG Ratio: 1.2 (calc) (ref 1.0–2.5)
ALT: 10 U/L (ref 6–29)
AST: 10 U/L (ref 10–30)
Albumin: 4.3 g/dL (ref 3.6–5.1)
Alkaline phosphatase (APISO): 76 U/L (ref 31–125)
BUN: 13 mg/dL (ref 7–25)
CO2: 22 mmol/L (ref 20–32)
Calcium: 9.5 mg/dL (ref 8.6–10.2)
Chloride: 105 mmol/L (ref 98–110)
Creat: 0.99 mg/dL (ref 0.50–0.99)
Globulin: 3.5 g/dL (calc) (ref 1.9–3.7)
Glucose, Bld: 101 mg/dL — ABNORMAL HIGH (ref 65–99)
Potassium: 4.3 mmol/L (ref 3.5–5.3)
Sodium: 137 mmol/L (ref 135–146)
Total Bilirubin: 0.3 mg/dL (ref 0.2–1.2)
Total Protein: 7.8 g/dL (ref 6.1–8.1)
eGFR: 73 mL/min/{1.73_m2} (ref >=60)

## 2022-09-07 LAB — CBC WITH DIFFERENTIAL/PLATELET
Absolute Monocytes: 549 cells/uL (ref 200–950)
Basophils Absolute: 47 cells/uL (ref 0–200)
Basophils Relative: 0.5 %
Eosinophils Absolute: 19 cells/uL (ref 15–500)
Eosinophils Relative: 0.2 %
HCT: 37.9 % (ref 35.0–45.0)
Hemoglobin: 12.4 g/dL (ref 11.7–15.5)
Lymphs Abs: 3395 cells/uL (ref 850–3900)
MCH: 26.7 pg — ABNORMAL LOW (ref 27.0–33.0)
MCHC: 32.7 g/dL (ref 32.0–36.0)
MCV: 81.5 fL (ref 80.0–100.0)
MPV: 9.9 fL (ref 7.5–12.5)
Monocytes Relative: 5.9 %
Neutro Abs: 5292 cells/uL (ref 1500–7800)
Neutrophils Relative %: 56.9 %
Platelets: 293 10*3/uL (ref 140–400)
RBC: 4.65 10*6/uL (ref 3.80–5.10)
RDW: 13.3 % (ref 11.0–15.0)
Total Lymphocyte: 36.5 %
WBC: 9.3 10*3/uL (ref 3.8–10.8)

## 2022-09-07 NOTE — Progress Notes (Signed)
CBC and CMP WNL

## 2022-09-12 ENCOUNTER — Ambulatory Visit (INDEPENDENT_AMBULATORY_CARE_PROVIDER_SITE_OTHER): Payer: 59

## 2022-09-12 DIAGNOSIS — J4540 Moderate persistent asthma, uncomplicated: Secondary | ICD-10-CM

## 2022-10-10 ENCOUNTER — Telehealth: Payer: Self-pay | Admitting: Allergy & Immunology

## 2022-10-10 NOTE — Telephone Encounter (Signed)
Called to schedule [employer].

## 2022-10-13 ENCOUNTER — Ambulatory Visit: Payer: 59

## 2022-10-14 ENCOUNTER — Other Ambulatory Visit: Payer: Self-pay | Admitting: Allergy & Immunology

## 2022-10-17 ENCOUNTER — Ambulatory Visit: Payer: 59 | Admitting: *Deleted

## 2022-10-17 ENCOUNTER — Telehealth: Payer: Self-pay | Admitting: *Deleted

## 2022-10-17 DIAGNOSIS — J4540 Moderate persistent asthma, uncomplicated: Secondary | ICD-10-CM

## 2022-10-17 NOTE — Telephone Encounter (Signed)
Patient's husband came in with her today and with direction administered the Fasenra injection. Patient would like to continue to self administer at home by her husband. Does she need to do anything specific so that the [employer] [facility]enger is mailed to her home instead?

## 2022-10-17 NOTE — Telephone Encounter (Signed)
Thank You, Tammy. I called the patient and advised and put a note in for that appointment to go over how to use the Fasenra Pen.

## 2022-10-28 ENCOUNTER — Other Ambulatory Visit: Payer: Self-pay | Admitting: Allergy & Immunology

## 2022-11-03 ENCOUNTER — Other Ambulatory Visit: Payer: Self-pay | Admitting: Rheumatology

## 2022-11-03 DIAGNOSIS — M0579 Rheumatoid arthritis with rheumatoid factor of multiple sites without organ or systems involvement: Secondary | ICD-10-CM

## 2022-11-06 NOTE — Telephone Encounter (Signed)
Last Fill: 08/10/2022  Eye exam: 05/03/2022 WNL    Labs: 09/06/2022 CBC and CMP WNL   Next Visit: 02/06/2023  Last Visit: 09/06/2022  [id number] arthritis involving multiple sites with positive rheumatoid factor   Current Dose per office note 09/06/2022: Plaquenil 200 mg 1 tablet by mouth twice daily   Okay to refill Plaquenil?

## 2022-11-09 ENCOUNTER — Ambulatory Visit: Payer: 59 | Admitting: Allergy & Immunology

## 2022-12-12 ENCOUNTER — Ambulatory Visit: Payer: 59 | Admitting: Allergy & Immunology

## 2022-12-19 ENCOUNTER — Ambulatory Visit (INDEPENDENT_AMBULATORY_CARE_PROVIDER_SITE_OTHER): Payer: 59 | Admitting: Allergy & Immunology

## 2022-12-19 ENCOUNTER — Encounter: Payer: Self-pay | Admitting: Allergy & Immunology

## 2022-12-19 ENCOUNTER — Other Ambulatory Visit: Payer: Self-pay

## 2022-12-19 VITALS — BP 132/88 | HR 82 | Temp 98.3°F | Resp 18

## 2022-12-19 DIAGNOSIS — J3089 Other allergic rhinitis: Secondary | ICD-10-CM

## 2022-12-19 DIAGNOSIS — K9049 Malabsorption due to intolerance, not elsewhere classified: Secondary | ICD-10-CM | POA: Diagnosis not present

## 2022-12-19 DIAGNOSIS — J4540 Moderate persistent asthma, uncomplicated: Secondary | ICD-10-CM | POA: Diagnosis not present

## 2022-12-19 DIAGNOSIS — J302 Other seasonal allergic rhinitis: Secondary | ICD-10-CM

## 2022-12-19 DIAGNOSIS — K219 Gastro-esophageal reflux disease without esophagitis: Secondary | ICD-10-CM

## 2022-12-19 DIAGNOSIS — B999 Unspecified infectious disease: Secondary | ICD-10-CM

## 2022-12-19 NOTE — Patient Instructions (Addendum)
1. Seasonal and perennial allergic rhinitis - Previous testing showed: grasses, ragweed, weeds, trees, outdoor molds, dust mites, cat, dog, and cockroach. - Continue taking: Allegra (fexofenadine) 1-2 tablets once daily and Singulair (montelukast) 10mg  daily - Continue taking: Flonase one spray per nostril up to twice daily as needed - You can use an extra dose of the antihistamine, if needed, for breakthrough symptoms.  - Consider nasal saline rinses 1-2 times daily to remove allergens from the nasal cavities as well as help with mucous clearance (this is especially helpful to do before the nasal sprays are given) - I think we can hold off on allergy shots as a means of long-term control.  2. Food intolerance - Continue to avoid peanuts and green peas and soy. - EpiPen refilled today.   3. Gastroesophageal reflux disease - Continue with Nexium 40mg  twice daily.   4. Recurrent infections  - Immune work up was normal.   5. Moderate persistent asthma, uncomplicated - Lung testing looked much better today!   - Fasenra Pen administered today. - Tammy will reach out to discuss future shipments.   - Daily controller medication(s): Trelegy 200/62.5/25 one puff once daily + Fasenra every month x 3 doses (then every 8 weeks thereafter)  - Prior to physical activity: albuterol 2 puffs 10-15 minutes before physical activity. - Rescue medications: albuterol 4 puffs every 4-6 hours as needed and DuoNeb nebulizer one vial every 4-6 hours as needed - Asthma control goals:  * Full participation in all desired activities (may need albuterol before activity) * Albuterol use two time or less a week on average (not counting use with activity) * Cough interfering with sleep two time or less a month * Oral steroids no more than once a year * No hospitalizations  6. Return in about 6 months (around 06/19/2023).    Please inform us of any Emergency Department visits, hospitalizations, or changes in  symptoms. Call us before going to the ED for breathing or allergy symptoms since we might be able to fit you in for a sick visit. Feel free to contact us anytime with any questions, problems, or concerns.  It was a pleasure to see you again today! LOVED meeting your adorable grandmother!   Websites that have reliable patient information: 1. American Academy of Asthma, Allergy, and Immunology: www.aaaai.org 2. Food Allergy Research and Education (FARE): foodallergy.org 3. Mothers of Asthmatics: http://www.asthmacommunitynetwork.org 4. American College of Allergy, Asthma, and Immunology: www.acaai.org   COVID-19 Vaccine Information can be found at: [email or web address] For questions related to vaccine distribution or appointments, please email [email or web address] or call [phone].   We realize that you might be concerned about having an allergic reaction to the COVID19 vaccines. To help with that concern, WE ARE OFFERING THE COVID19 VACCINES IN OUR OFFICE! Ask the front desk for dates!     "Like" [place] on Facebook and Instagram for our latest updates!      A healthy democracy works best when [employer] participate! Make sure you are registered to vote! If you have moved or changed any of your contact information, you will need to get this updated before voting!  In some cases, you MAY be able to register to vote online: [email or web address]

## 2022-12-19 NOTE — Progress Notes (Signed)
FOLLOW UP  Date of Service/Encounter:  12/19/22   Assessment:   Seasonal and perennial allergic rhinitis (grasses, ragweed, weeds, trees, outdoor molds, dust mites, cat, dog, and cockroach)   Food intolerance (peanuts and green peas and soy)   Gastroesophageal reflux disease   Recurrent infections - with tympanostomy tubes in place   Moderate persistent asthma, uncomplicated - with eosinophilic phenotype (AEC 300 in February 2024)   Recurrent prednisone use - with multiple complications  Plan/Recommendations:   1. Seasonal and perennial allergic rhinitis - Previous testing showed: grasses, ragweed, weeds, trees, outdoor molds, dust mites, cat, dog, and cockroach. - Continue taking: Allegra (fexofenadine) 1-2 tablets once daily and Singulair (montelukast) 10mg  daily - Continue taking: Flonase one spray per nostril up to twice daily as needed - You can use an extra dose of the antihistamine, if needed, for breakthrough symptoms.  - Consider nasal saline rinses 1-2 times daily to remove allergens from the nasal cavities as well as help with mucous clearance (this is especially helpful to do before the nasal sprays are given) - I think we can hold off on allergy shots as a means of long-term control.  2. Food intolerance - Continue to avoid peanuts and green peas and soy. - EpiPen refilled today.   3. Gastroesophageal reflux disease - Continue with Nexium 40mg  twice daily.   4. Recurrent infections  - Immune work up was normal.   5. Moderate persistent asthma, uncomplicated - Lung testing looked much better today!   - Fasenra Pen administered today. - Haley Goodman will reach out to discuss future shipments.   - Daily controller medication(s): Trelegy 200/62.5/25 one puff once daily + Fasenra every month x 3 doses (then every 8 weeks thereafter)  - Prior to physical activity: albuterol 2 puffs 10-15 minutes before physical activity. - Rescue medications: albuterol 4 puffs every  4-6 hours as needed and DuoNeb nebulizer one vial every 4-6 hours as needed - Asthma control goals:  * Full participation in all desired activities (may need albuterol before activity) * Albuterol use two time or less a week on average (not counting use with activity) * Cough interfering with sleep two time or less a month * Oral steroids no more than once a year * No hospitalizations  6. Return in about 6 months (around 06/19/2023).   Subjective:   Haley Goodman is a 42 y.o. female presenting today for follow up of  Chief Complaint  Patient presents with   Asthma    Patient states that since starting injections, she's been doing a lot better and hasn't had any flare ups.   Allergic Rhinitis     Patient states that she didn't have it as bad this year.    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   History of subdural hemorrhage    Migraine headaches    Weakness of right side of body    Ruptured aneurysm of artery    Hypercholesterolemia 03/01/2021   Immunodeficiency due to drugs 03/01/2021   Neck pain 08/24/2020   Osteoarthritis (arthritis due to wear and tear of joints) 04/16/2020   History of colonic polyps 01/02/2020   Allergic rhinitis 07/25/2019   GERD (gastroesophageal reflux disease) 07/25/2019   Endometritis 07/14/2019   Chronic low back pain 06/11/2019   Dry eyes 09/04/2018   Macromastia 09/04/2018   Degeneration  of lumbar intervertebral disc 09/04/2018   Chronic anticoagulation 08/07/2017   Pelvic pain in female 04/11/2017   Major depressive disorder    Fibromyalgia 05/10/2016   Chronic pain syndrome 05/10/2016   High risk medication use/ Plaquenil 05/10/2016   Spondylosis of lumbar region without myelopathy or radiculopathy 05/10/2016   Abnormal serum protein electrophoresis 05/10/2016    Vitamin D deficiency 05/10/2016   Rheumatoid arthritis involving multiple sites with positive rheumatoid factor    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  Discussed the use of AI scribe software for clinical note transcription with the patient and/or guardian, who gave verbal consent to proceed.  Haley Goodman is a 42 y.o. female presenting for a follow up visit.  She was last seen in May 2024.  At that time, we continued her on Allegra as well as Flonase.  We did talk about allergy shots.  For her GERD, we continue with Nexium 40 mg twice daily.  Her lung testing looks lower.  We did start her on Fasenra.  We continue with Trelegy 200 mcg 1 puff once daily.  Since last visit, she has done very well.  The patient, accompanied by her grandmother, presents for a follow-up visit. She reports significant improvement in her respiratory symptoms, with no recent flare-ups. She attributes this improvement to the administration of Haley Goodman, which she believes has been very beneficial. She is transitioning to the Haley Goodman pen and is nearing an eight-week interval between doses.  In addition to [place], the patient is on daily Trelegy and has noticed a decreased need for albuterol. She also reports a history of declining spirometry results, but notes an improvement at her last visit. This has been working well.  She has not been using her rescue  inhaler as much.  She has not been on prednisone at all since last time we saw her.  The patient also has a history of allergies, which have been well-controlled recently. She is on twice-daily Allegra and nightly montelukast. She has not needed any nasal sprays.  The patient also reports a recent ear infection, which required the placement of a tube in the office. She did not experience any discomfort during the procedure.   The patient has been traveling for work, going back and forth to [place]. She also reports significant weight loss, with a total of 98 pounds lost to date. She has a large family, with several grandchildren and great-grandchildren.     Otherwise, there have been no changes to her past medical history, surgical history, family history, or social history.    Review of systems otherwise negative other than that mentioned in the HPI.    Objective:   Blood pressure 132/88, pulse 82, temperature 98.3 F (36.8 C), temperature source Temporal, resp. rate 18, SpO2 99%. There is no height or weight on file to calculate BMI.    Physical Exam Vitals reviewed.  Constitutional:      Appearance: Normal appearance. She is well-developed.     Comments: Pleasant.  Cooperative with the exam.  HENT:     Head: Normocephalic and atraumatic.     Right Ear: Tympanic membrane, ear canal and external ear normal.     Left Ear: Tympanic membrane, ear canal and external ear normal.     Nose: No nasal deformity, septal deviation, mucosal edema or rhinorrhea.     Right Turbinates: Enlarged, swollen and pale.     Left Turbinates: Enlarged, swollen and pale.     Right Sinus: No maxillary sinus tenderness or frontal sinus tenderness.     Left Sinus: No maxillary sinus tenderness or frontal sinus tenderness.     Comments: No nasal polyps.     Mouth/Throat:     Lips: Pink.     Mouth: Mucous membranes are moist. Mucous membranes are not pale and not dry.     Pharynx: Uvula midline.   Eyes:     General: Lids are normal. No allergic shiner.       Right eye: No discharge.        Left eye: No discharge.     Conjunctiva/sclera: Conjunctivae normal.     Right eye: Right conjunctiva is not injected. No chemosis.    Left eye: Left conjunctiva is not injected. No chemosis.    Pupils: Pupils are equal, round, and reactive to light.  Cardiovascular:     Rate and Rhythm: Normal rate and regular rhythm.     Heart sounds: Normal heart sounds.  Pulmonary:     Effort: Pulmonary effort is normal. No tachypnea, accessory muscle usage or respiratory distress.     Breath sounds: No wheezing, rhonchi or rales.     Comments: Moving air well in all lung fields.  No increased work of breathing. Chest:     Chest wall: No tenderness.  Lymphadenopathy:     Cervical: No cervical adenopathy.  Skin:    General: Skin is warm.     Capillary Refill: Capillary refill takes less than 2 seconds.     Coloration: Skin is not pale.     Findings: No abrasion, erythema, petechiae or rash. Rash is not papular, urticarial or vesicular.  Neurological:     Mental Status: She is alert.  Psychiatric:        Behavior: Behavior is cooperative.      Diagnostic studies:    Spirometry: results normal (FEV1: 2.37/86%, FVC: 2.76/81%, FEV1/FVC: 86%).    Spirometry consistent with normal pattern.   Allergy Studies: none        Malachi Bonds, MD  [facility]

## 2022-12-20 ENCOUNTER — Encounter: Payer: Self-pay | Admitting: *Deleted

## 2022-12-20 ENCOUNTER — Telehealth: Payer: Self-pay | Admitting: *Deleted

## 2022-12-20 MED ORDER — FASENRA PEN 30 MG/ML ~~LOC~~ SOAJ: 30.0000 mg | SUBCUTANEOUS | 6 refills | Status: DC

## 2022-12-20 NOTE — Telephone Encounter (Signed)
-----   Message from Alfonse Spruce sent at 12/19/2022  1:41 PM EDT ----- Eduard Roux sample today with autoinjector! Patient wants to continue with shots at home.

## 2022-12-20 NOTE — Telephone Encounter (Signed)
Tried to reach patient but has call blocker on same. Sent mychart message to contact me regarding her aprpoval and new Rx for [place] pen to [employer]

## 2022-12-26 NOTE — Telephone Encounter (Signed)
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

## 2022-12-27 NOTE — Telephone Encounter (Signed)
Thank you, much!   Malachi Bonds, MD [facility]

## 2023-01-17 ENCOUNTER — Other Ambulatory Visit: Payer: Self-pay | Admitting: Allergy & Immunology

## 2023-01-23 NOTE — Progress Notes (Unsigned)
Office Visit Note  Patient: Haley Goodman             Date of Birth: 27-Aug-1980           MRN: [id number]             PCP: Stevphen Rochester, MD Referring: Stevphen Rochester, MD Visit Date: 02/06/2023 Occupation: @GUAROCC @  Subjective:  No chief complaint on file.   History of Present Illness: Haley Goodman is a 42 y.o. female ***     Activities of Daily Living:  Patient reports morning stiffness for *** {minute/hour:19697}.   Patient {ACTIONS;DENIES/REPORTS:21021675::"Denies"} nocturnal pain.  Difficulty dressing/grooming: {ACTIONS;DENIES/REPORTS:21021675::"Denies"} Difficulty climbing stairs: {ACTIONS;DENIES/REPORTS:21021675::"Denies"} Difficulty getting out of chair: {ACTIONS;DENIES/REPORTS:21021675::"Denies"} Difficulty using hands for taps, buttons, cutlery, and/or writing: {ACTIONS;DENIES/REPORTS:21021675::"Denies"}  No Rheumatology ROS completed.   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   History of subdural hemorrhage    Migraine headaches    Weakness of right side of body    Ruptured aneurysm of artery    Hypercholesterolemia 03/01/2021   Immunodeficiency due to drugs 03/01/2021   Neck pain 08/24/2020   Osteoarthritis (arthritis due to wear and tear of joints) 04/16/2020   History of colonic polyps 01/02/2020   Allergic rhinitis 07/25/2019   GERD (gastroesophageal reflux disease) 07/25/2019   Endometritis 07/14/2019   Chronic low back pain 06/11/2019   Dry eyes 09/04/2018   Macromastia 09/04/2018   Degeneration of lumbar intervertebral disc 09/04/2018   Chronic anticoagulation 08/07/2017   Pelvic pain in female 04/11/2017   Major depressive disorder    Fibromyalgia 05/10/2016   Chronic pain syndrome 05/10/2016   High risk medication use/ Plaquenil 05/10/2016   Spondylosis of lumbar region without myelopathy or  radiculopathy 05/10/2016   Abnormal serum protein electrophoresis 05/10/2016   Vitamin D deficiency 05/10/2016   Rheumatoid arthritis involving multiple sites with positive rheumatoid factor    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   Type 2 diabetes mellitus without complication, with long-term current use of insulin 02/27/2014   Obesity 09/10/2013   Alopecia 09/10/2013   Bipolar disorder in partial remission 09/10/2013   Cerebral infarction 09/10/2013   Pulmonary embolism without acute cor pulmonale (HCC) 03/05/2009   Elevated liver enzymes 03/05/2009   Hypercoagulable state, secondary 02/15/2009   S/P cholecystectomy 02/15/2009   Primary insomnia 04/27/2008   Essential hypertension, benign 03/30/2008   Systemic lupus erythematosus 03/30/2008    Past Medical History:  Diagnosis Date   Abnormal serum protein electrophoresis 05/10/2016   Labs from April 23, 2015, SPEP and M-Spike is negative except 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 Related to Lupus, better on Plaquenil   Anemia due to acute blood loss 02/15/2009   Arthritis    Bipolar disorder in partial remission 09/10/2013   Hx psychosis/ hallucinations, last in 2012   Cerebral infarction 09/10/2013   Chronic anticoagulation 08/07/2017   Chronic low back pain 06/11/2019   Chronic pain syndrome 05/10/2016   Elevated liver enzymes 03/05/2009   Essential hypertension, benign 03/30/2008   Fibromyalgia  GERD (gastroesophageal reflux disease) 07/25/2019   History of colonic polyps 01/02/2020   Negative colonoscopy performed in November 2021   History of subdural hemorrhage     Hypercholesterolemia 03/01/2021   Hypercoagulable state, secondary 02/15/2009   Immunodeficiency due to drugs 03/01/2021   Major depressive disorder    Migraine headaches    Mild neurocognitive disorder due to multiple etiologies 03/22/2021   Neck pain 08/24/2020   Osteoarthritis (arthritis due to wear and tear of joints) 04/16/2020   PE (pulmonary embolism) 2009   Pelvic pain in female 04/11/2017   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   S/P cholecystectomy 02/15/2009   Spondylosis of lumbar region without myelopathy or radiculopathy 05/10/2016   Subdural hematoma (HCC)    Systemic lupus erythematosus 03/30/2008   scalp skin lesions, on Plaquenil, 2009   Type 2 diabetes mellitus without complication, with long-term current use of insulin 02/27/2014   steroid induced   Upper respiratory tract infection 10/02/2018   Vitamin D deficiency 05/10/2016   Weakness of right side of body     Family History  Problem Relation Age of Onset   Stroke Mother    Cancer Mother    Cancer Sister    Dementia Maternal Grandmother 29   Cancer Other    Diabetes Other    Dementia Maternal Great-grandmother 16   Pseudochol deficiency Neg Hx    Malignant hyperthermia Neg Hx    Hypotension Neg Hx    Anesthesia problems Neg Hx    Past Surgical History:  Procedure Laterality Date   ABLATION     BRAIN SURGERY     BREAST REDUCTION SURGERY  01/28/2020   Dr. Deforest Hoyles at [facility]   CHOLECYSTECTOMY     DILATION AND CURETTAGE OF UTERUS     HYSTEROSCOPY  12/30/2019   Dr. Rosalia Hammers   TUBAL LIGATION     Social History   Social History Narrative   Not on file   Immunization History  Administered Date(s) Administered   Influenza Split 01/26/2014   Influenza, Seasonal, Injecte, Preservative Fre 12/05/2010, 12/23/2015   Influenza,inj,Quad PF,6+ Mos 12/05/2017   Influenza-Unspecified 03/30/2008, 11/22/2009, 02/11/2013, 12/18/2013, 11/25/2014   Moderna Sars-Covid-2 Vaccination 09/18/2019, 10/16/2019, 03/18/2020   PPD Test 10/03/2017   Pneumococcal Conjugate-13 02/27/2013   Pneumococcal Polysaccharide-23 04/07/2011   Tdap 09/10/2013     Objective: Vital Signs: There were no vitals taken for this visit.   Physical Exam   Musculoskeletal Exam: ***  CDAI Exam: CDAI Score: -- Patient Global: --; Provider Global: -- Swollen: --; Tender: -- Joint Exam 02/06/2023   No joint exam has been documented for this visit   There is currently no information documented on the homunculus. Go to the Rheumatology activity and complete the homunculus joint exam.  Investigation: No additional findings.  Imaging: No results found.  Recent Labs: Lab Results  Component Value Date   WBC 9.3 09/06/2022   HGB 12.4 09/06/2022   PLT 293 09/06/2022   NA 137 09/06/2022   K  4.3 09/06/2022   CL 105 09/06/2022   CO2 22 09/06/2022   GLUCOSE 101 (H) 09/06/2022   BUN 13 09/06/2022   CREATININE 0.99 09/06/2022   BILITOT 0.3 09/06/2022   ALKPHOS 71 04/04/2022   AST 10 09/06/2022   ALT 10 09/06/2022   PROT 7.8 09/06/2022   ALBUMIN 4.4 04/04/2022   CALCIUM 9.5 09/06/2022   GFRAA 103 06/21/2020    Speciality Comments: PLQ Eye Exam: 05/03/2022 WNL @ [facility] follow up in 1 year  Procedures:  No procedures performed Allergies: Lacosamide, Peanut oil, Penicillins, Prochlorperazine, Soybean-containing drug products, Sulfa antibiotics, Wheat,  Cetirizine, Ciprofloxacin, Iodinated contrast media, Lisinopril, Naproxen, Nitrofurantoin, Other, Pregabalin, Codeine, Compazine, Gabapentin, Lipitor [atorvastatin], Nitrofurantoin monohyd macro, Sulfasalazine, and Dexamethasone   Assessment / Plan:     Visit Diagnoses: Rheumatoid arthritis involving multiple sites with positive rheumatoid factor (HCC)  High risk medication use  Chronic pain of both shoulders  Primary osteoarthritis of both hands  Fibromyalgia  Trapezius muscle spasm  History of vitamin D deficiency  Positive PPD  History of pulmonary embolism  History of depression  History of hypertension  Moderate persistent asthma with acute exacerbation  Orders: No orders of the defined types were placed in this encounter.  No orders of the defined types were placed in this encounter.   Face-to-face time spent with patient was *** minutes. Greater than 50% of time was spent in counseling and coordination of care.  Follow-Up Instructions: No follow-ups on file.   Gearldine Bienenstock, PA-C  Note - This record has been created using Dragon software.  Chart creation errors have been sought, but may not always  have been located. Such creation errors do not reflect on  the standard of medical care.

## 2023-02-06 ENCOUNTER — Ambulatory Visit: Payer: 59 | Admitting: Physician Assistant

## 2023-02-06 DIAGNOSIS — M19041 Primary osteoarthritis, right hand: Secondary | ICD-10-CM

## 2023-02-06 DIAGNOSIS — G8929 Other chronic pain: Secondary | ICD-10-CM

## 2023-02-06 DIAGNOSIS — Z79899 Other long term (current) drug therapy: Secondary | ICD-10-CM

## 2023-02-06 DIAGNOSIS — J4541 Moderate persistent asthma with (acute) exacerbation: Secondary | ICD-10-CM

## 2023-02-06 DIAGNOSIS — M0579 Rheumatoid arthritis with rheumatoid factor of multiple sites without organ or systems involvement: Secondary | ICD-10-CM

## 2023-02-06 DIAGNOSIS — Z86711 Personal history of pulmonary embolism: Secondary | ICD-10-CM

## 2023-02-06 DIAGNOSIS — R7611 Nonspecific reaction to tuberculin skin test without active tuberculosis: Secondary | ICD-10-CM

## 2023-02-06 DIAGNOSIS — Z8639 Personal history of other endocrine, nutritional and metabolic disease: Secondary | ICD-10-CM

## 2023-02-06 DIAGNOSIS — Z8679 Personal history of other diseases of the circulatory system: Secondary | ICD-10-CM

## 2023-02-06 DIAGNOSIS — M797 Fibromyalgia: Secondary | ICD-10-CM

## 2023-02-06 DIAGNOSIS — Z8659 Personal history of other mental and behavioral disorders: Secondary | ICD-10-CM

## 2023-02-06 DIAGNOSIS — M62838 Other muscle spasm: Secondary | ICD-10-CM

## 2023-02-22 NOTE — Progress Notes (Deleted)
 Office Visit Note  Patient: Haley Goodman             Date of Birth: 1981/02/02           MRN: [id number]             PCP: Stevphen Rochester, MD Referring: Stevphen Rochester, MD Visit Date: 03/06/2023 Occupation: @GUAROCC @  Subjective:  No chief complaint on file.   History of Present Illness: Haley Goodman is a 42 y.o. female ***     Activities of Daily Living:  Patient reports morning stiffness for *** {minute/hour:19697}.   Patient {ACTIONS;DENIES/REPORTS:21021675::"Denies"} nocturnal pain.  Difficulty dressing/grooming: {ACTIONS;DENIES/REPORTS:21021675::"Denies"} Difficulty climbing stairs: {ACTIONS;DENIES/REPORTS:21021675::"Denies"} Difficulty getting out of chair: {ACTIONS;DENIES/REPORTS:21021675::"Denies"} Difficulty using hands for taps, buttons, cutlery, and/or writing: {ACTIONS;DENIES/REPORTS:21021675::"Denies"}  No Rheumatology ROS completed.   PMFS History:  Patient Active Problem List   Diagnosis Date Noted  . Subdural hematoma (HCC)   . Mild neurocognitive disorder due to multiple etiologies 03/22/2021  . History of subdural hemorrhage   . Migraine headaches   . Weakness of right side of body   . Ruptured aneurysm of artery   . Hypercholesterolemia 03/01/2021  . Immunodeficiency due to drugs 03/01/2021  . Neck pain 08/24/2020  . Osteoarthritis (arthritis due to wear and tear of joints) 04/16/2020  . History of colonic polyps 01/02/2020  . Allergic rhinitis 07/25/2019  . GERD (gastroesophageal reflux disease) 07/25/2019  . Endometritis 07/14/2019  . Chronic low back pain 06/11/2019  . Dry eyes 09/04/2018  . Macromastia 09/04/2018  . Degeneration of lumbar intervertebral disc 09/04/2018  . Chronic anticoagulation 08/07/2017  . Pelvic pain in female 04/11/2017  . Major depressive disorder   . Fibromyalgia 05/10/2016  . Chronic pain syndrome 05/10/2016  . High risk medication use/ Plaquenil 05/10/2016  . Spondylosis of lumbar region  without myelopathy or radiculopathy 05/10/2016  . Abnormal serum protein electrophoresis 05/10/2016  . Vitamin D deficiency 05/10/2016  . Rheumatoid arthritis involving multiple sites with positive rheumatoid factor   . 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 Type 2 diabetes mellitus without complication, with long-term current use of insulin 02/27/2014  . Obesity 09/10/2013  . Alopecia 09/10/2013  . Bipolar disorder in partial remission 09/10/2013  . Cerebral infarction 09/10/2013  . Pulmonary embolism without acute cor pulmonale (HCC) 03/05/2009  . Elevated liver enzymes 03/05/2009  . Hypercoagulable state, secondary 02/15/2009  . S/P cholecystectomy 02/15/2009  . Primary insomnia 04/27/2008  . Essential hypertension, benign 03/30/2008  . Systemic lupus erythematosus 03/30/2008    Past Medical History:  Diagnosis Date  . Abnormal serum protein electrophoresis 05/10/2016   Labs from April 23, 2015, SPEP and M-Spike is negative except 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 Overview:  Labs from April 23, 2015, SPEP and M-Spike is negative except she does have polyclonal gammopathy IgA kappa lambda and she is being seen by hematology Formatting of this  . Allergic rhinitis 07/25/2019  . Alopecia 09/10/2013   ? Related to Lupus, better on Plaquenil  . Anemia due to acute blood loss 02/15/2009  . Arthritis   . Bipolar disorder in partial remission 09/10/2013   Hx psychosis/ hallucinations, last in 2012  . Cerebral infarction 09/10/2013  . Chronic anticoagulation 08/07/2017  . Chronic low back pain 06/11/2019  . Chronic pain syndrome 05/10/2016  . Elevated liver enzymes 03/05/2009  . Essential hypertension, benign 03/30/2008  . Fibromyalgia   .  GERD (gastroesophageal reflux disease) 07/25/2019  . History of colonic polyps 01/02/2020   Negative colonoscopy performed in  November 2021  . History of subdural hemorrhage   . Hypercholesterolemia 03/01/2021  . Hypercoagulable state, secondary 02/15/2009  . Immunodeficiency due to drugs 03/01/2021  . Major depressive disorder   . Migraine headaches   . Mild neurocognitive disorder due to multiple etiologies 03/22/2021  . Neck pain 08/24/2020  . Osteoarthritis (arthritis due to wear and tear of joints) 04/16/2020  . PE (pulmonary embolism) 2009  . Pelvic pain in female 04/11/2017  . 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 Primary insomnia 04/27/2008  . Pulmonary embolism without acute cor pulmonale 03/05/2009   Lifelong coumadin Anticoagulation; IMOLOAD 2017 R1.1  . Rheumatoid arthritis involving multiple sites with positive rheumatoid factor    Positive RF and positive anti-CCP  Formatting of this note might be different from the original. Overview:  Positive RF and positive anti-CCP  . 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 S/P cholecystectomy 02/15/2009  . Spondylosis of lumbar region without myelopathy or radiculopathy 05/10/2016  . Subdural hematoma (HCC)   . Systemic lupus erythematosus 03/30/2008   scalp skin lesions, on Plaquenil, 2009  . Type 2 diabetes mellitus without complication, with long-term current use of insulin 02/27/2014   steroid induced  . Upper respiratory tract infection 10/02/2018  . Vitamin D deficiency 05/10/2016  . Weakness of right side of body     Family History  Problem Relation Age of Onset  . Stroke Mother   . Cancer Mother   . Cancer Sister   . Dementia Maternal Grandmother 53  . Cancer Other   . Diabetes Other   . Dementia Maternal Great-grandmother 67  . Pseudochol deficiency Neg Hx   . Malignant hyperthermia Neg Hx   . Hypotension Neg Hx   . Anesthesia problems Neg Hx    Past Surgical History:  Procedure Laterality Date  . ABLATION    . BRAIN  SURGERY    . BREAST REDUCTION SURGERY  01/28/2020   Dr. Deforest Hoyles at [facility]  . CHOLECYSTECTOMY    . DILATION AND CURETTAGE OF UTERUS    . HYSTEROSCOPY  12/30/2019   Dr. Rosalia Hammers  . TUBAL LIGATION     Social History   Social History Narrative  . Not on file   Immunization History  Administered Date(s) Administered  . Influenza Split 01/26/2014  . Influenza, Seasonal, Injecte, Preservative Fre 12/05/2010, 12/23/2015  . Influenza,inj,Quad PF,6+ Mos 12/05/2017  . Influenza-Unspecified 03/30/2008, 11/22/2009, 02/11/2013, 12/18/2013, 11/25/2014  . Moderna Sars-Covid-2 Vaccination 09/18/2019, 10/16/2019, 03/18/2020  . PPD Test 10/03/2017  . Pneumococcal Conjugate-13 02/27/2013  . Pneumococcal Polysaccharide-23 04/07/2011  . Tdap 09/10/2013     Objective: Vital Signs: There were no vitals taken for this visit.   Physical Exam   Musculoskeletal Exam: ***  CDAI Exam: CDAI Score: -- Patient Global: --; Provider Global: -- Swollen: --; Tender: -- Joint Exam 03/06/2023   No joint exam has been documented for this visit   There is currently no information documented on the homunculus. Go to the Rheumatology activity and complete the homunculus joint exam.  Investigation: No additional findings.  Imaging: No results found.  Recent Labs: Lab Results  Component Value Date   WBC 9.3 09/06/2022   HGB 12.4 09/06/2022   PLT 293 09/06/2022   NA 137 09/06/2022   K  4.3 09/06/2022   CL 105 09/06/2022   CO2 22 09/06/2022   GLUCOSE 101 (H) 09/06/2022   BUN 13 09/06/2022   CREATININE 0.99 09/06/2022   BILITOT 0.3 09/06/2022   ALKPHOS 71 04/04/2022   AST 10 09/06/2022   ALT 10 09/06/2022   PROT 7.8 09/06/2022   ALBUMIN 4.4 04/04/2022   CALCIUM 9.5 09/06/2022   GFRAA 103 06/21/2020    Speciality Comments: PLQ Eye Exam: 05/03/2022 WNL @ Dr[facility] follow up in 1 year  Procedures:  No procedures performed Allergies: Lacosamide,  Peanut oil, Penicillins, Prochlorperazine, Soybean-containing drug products, Sulfa antibiotics, Wheat, Cetirizine, Ciprofloxacin, Iodinated contrast media, Lisinopril, Naproxen, Nitrofurantoin, Other, Pregabalin, Codeine, Compazine, Gabapentin, Lipitor [atorvastatin], Nitrofurantoin monohyd macro, Sulfasalazine, and Dexamethasone   Assessment / Plan:     Visit Diagnoses: Rheumatoid arthritis involving multiple sites with positive rheumatoid factor (HCC)  High risk medication use  Chronic pain of both shoulders  Primary osteoarthritis of both hands  Fibromyalgia  Trapezius muscle spasm  History of vitamin D deficiency  Positive PPD  History of pulmonary embolism  History of depression  History of hypertension  Moderate persistent asthma with acute exacerbation  History of psychosis  Orders: No orders of the defined types were placed in this encounter.  No orders of the defined types were placed in this encounter.   Face-to-face time spent with patient was *** minutes. Greater than 50% of time was spent in counseling and coordination of care.  Follow-Up Instructions: No follow-ups on file.   Gearldine Bienenstock, PA-C  Note - This record has been created using Dragon software.  Chart creation errors have been sought, but may not always  have been located. Such creation errors do not reflect on  the standard of medical care.

## 2023-03-06 ENCOUNTER — Ambulatory Visit: Payer: 59 | Admitting: Physician Assistant

## 2023-03-06 DIAGNOSIS — Z8659 Personal history of other mental and behavioral disorders: Secondary | ICD-10-CM

## 2023-03-06 DIAGNOSIS — Z79899 Other long term (current) drug therapy: Secondary | ICD-10-CM

## 2023-03-06 DIAGNOSIS — J4541 Moderate persistent asthma with (acute) exacerbation: Secondary | ICD-10-CM

## 2023-03-06 DIAGNOSIS — Z8679 Personal history of other diseases of the circulatory system: Secondary | ICD-10-CM

## 2023-03-06 DIAGNOSIS — M19041 Primary osteoarthritis, right hand: Secondary | ICD-10-CM

## 2023-03-06 DIAGNOSIS — G8929 Other chronic pain: Secondary | ICD-10-CM

## 2023-03-06 DIAGNOSIS — Z86711 Personal history of pulmonary embolism: Secondary | ICD-10-CM

## 2023-03-06 DIAGNOSIS — M62838 Other muscle spasm: Secondary | ICD-10-CM

## 2023-03-06 DIAGNOSIS — M0579 Rheumatoid arthritis with rheumatoid factor of multiple sites without organ or systems involvement: Secondary | ICD-10-CM

## 2023-03-06 DIAGNOSIS — Z8639 Personal history of other endocrine, nutritional and metabolic disease: Secondary | ICD-10-CM

## 2023-03-06 DIAGNOSIS — M797 Fibromyalgia: Secondary | ICD-10-CM

## 2023-03-06 DIAGNOSIS — R7611 Nonspecific reaction to tuberculin skin test without active tuberculosis: Secondary | ICD-10-CM

## 2023-03-21 ENCOUNTER — Other Ambulatory Visit: Payer: Self-pay | Admitting: Rheumatology

## 2023-03-26 ENCOUNTER — Telehealth: Payer: Self-pay | Admitting: *Deleted

## 2023-03-26 NOTE — Telephone Encounter (Signed)
Patient contacted the office requesting a refill on the Cyclobenzaprine. Patient advised the prescription refill that was sent to our office was denied because we have not been filling this prescription for her. Patient states her pain management doctor that she was seeing has closed. Patient states she is now seeing a new pain management doctor who advised her to reach out to her rheumatologist for a refill on the Cyclobenzaprine. Patient advised since she is seeing pain management they would need to manage that medication as well. Patient advised that she would need to reach back out to her pain management specialist.

## 2023-03-27 NOTE — Progress Notes (Deleted)
 Office Visit Note  Patient: Haley Goodman             Date of Birth: Jan 08, 1981           MRN: [id number]             PCP: Stevphen Rochester, MD Referring: Stevphen Rochester, MD Visit Date: 04/10/2023 Occupation: @GUAROCC @  Subjective:    History of Present Illness: Tiann Saha is a 43 y.o. female with history of seropositive rheumatoid arthritis and osteoarthritis.  Patient remains on plaquenil 200 mg 1 tablet by mouth twice daily.   CBC and CMP updated on 09/06/22. Orders for CBC and CMP released today.  PLQ Eye Exam: 05/03/2022 WNL @ Dr[facility] follow up in 1 year   Activities of Daily Living:  Patient reports morning stiffness for *** {minute/hour:19697}.   Patient {ACTIONS;DENIES/REPORTS:21021675::"Denies"} nocturnal pain.  Difficulty dressing/grooming: {ACTIONS;DENIES/REPORTS:21021675::"Denies"} Difficulty climbing stairs: {ACTIONS;DENIES/REPORTS:21021675::"Denies"} Difficulty getting out of chair: {ACTIONS;DENIES/REPORTS:21021675::"Denies"} Difficulty using hands for taps, buttons, cutlery, and/or writing: {ACTIONS;DENIES/REPORTS:21021675::"Denies"}  No Rheumatology ROS completed.   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   History of subdural hemorrhage    Migraine headaches    Weakness of right side of body    Ruptured aneurysm of artery    Hypercholesterolemia 03/01/2021   Immunodeficiency due to drugs 03/01/2021   Neck pain 08/24/2020   Osteoarthritis (arthritis due to wear and tear of joints) 04/16/2020   History of colonic polyps 01/02/2020   Allergic rhinitis 07/25/2019   GERD (gastroesophageal reflux disease) 07/25/2019   Endometritis 07/14/2019   Chronic low back pain 06/11/2019   Dry eyes 09/04/2018   Macromastia 09/04/2018   Degeneration of lumbar intervertebral disc 09/04/2018    Chronic anticoagulation 08/07/2017   Pelvic pain in female 04/11/2017   Major depressive disorder    Fibromyalgia 05/10/2016   Chronic pain syndrome 05/10/2016   High risk medication use/ Plaquenil 05/10/2016   Spondylosis of lumbar region without myelopathy or radiculopathy 05/10/2016   Abnormal serum protein electrophoresis 05/10/2016   Vitamin D deficiency 05/10/2016   Rheumatoid arthritis involving multiple sites with positive rheumatoid factor    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   Type 2 diabetes mellitus without complication, with long-term current use of insulin 02/27/2014   Obesity 09/10/2013   Alopecia 09/10/2013   Bipolar disorder in partial remission 09/10/2013   Cerebral infarction 09/10/2013   Pulmonary embolism without acute cor pulmonale (HCC) 03/05/2009   Elevated liver enzymes 03/05/2009   Hypercoagulable state, secondary 02/15/2009   S/P cholecystectomy 02/15/2009   Primary insomnia 04/27/2008   Essential hypertension, benign 03/30/2008   Systemic lupus erythematosus 03/30/2008    Past Medical History:  Diagnosis Date   Abnormal serum protein electrophoresis 05/10/2016   Labs from April 23, 2015, SPEP and M-Spike is negative except 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 Related to Lupus, better on Plaquenil   Anemia due to acute blood loss 02/15/2009   Arthritis    Bipolar disorder in partial remission 09/10/2013   Hx  psychosis/ hallucinations, last in 2012   Cerebral infarction 09/10/2013   Chronic anticoagulation 08/07/2017   Chronic low back pain 06/11/2019   Chronic pain syndrome 05/10/2016   Elevated  liver enzymes 03/05/2009   Essential hypertension, benign 03/30/2008   Fibromyalgia    GERD (gastroesophageal reflux disease) 07/25/2019   History of colonic polyps 01/02/2020   Negative colonoscopy performed in November 2021   History of subdural hemorrhage    Hypercholesterolemia 03/01/2021   Hypercoagulable state, secondary 02/15/2009   Immunodeficiency due to drugs 03/01/2021   Major depressive disorder    Migraine headaches    Mild neurocognitive disorder due to multiple etiologies 03/22/2021   Neck pain 08/24/2020   Osteoarthritis (arthritis due to wear and tear of joints) 04/16/2020   PE (pulmonary embolism) 2009   Pelvic pain in female 04/11/2017   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   S/P cholecystectomy 02/15/2009   Spondylosis of lumbar region without myelopathy or radiculopathy 05/10/2016   Subdural hematoma (HCC)    Systemic lupus erythematosus 03/30/2008   scalp skin lesions, on Plaquenil, 2009   Type 2 diabetes mellitus without complication, with long-term current use of insulin 02/27/2014   steroid induced   Upper respiratory tract infection 10/02/2018   Vitamin D deficiency 05/10/2016   Weakness of right side of body     Family History  Problem Relation Age of Onset   Stroke Mother    Cancer Mother    Cancer Sister    Dementia Maternal Grandmother 24   Cancer Other    Diabetes Other    Dementia Maternal Great-grandmother 81   Pseudochol deficiency  Neg Hx    Malignant hyperthermia Neg Hx    Hypotension Neg Hx    Anesthesia problems Neg Hx    Past Surgical History:  Procedure Laterality Date   ABLATION     BRAIN SURGERY     BREAST REDUCTION SURGERY  01/28/2020   Dr. Deforest Hoyles at [facility]   CHOLECYSTECTOMY     DILATION AND CURETTAGE OF UTERUS     HYSTEROSCOPY  12/30/2019   Dr. Rosalia Hammers   TUBAL LIGATION     Social History   Social History Narrative   Not on file   Immunization History  Administered Date(s) Administered   Influenza Split 01/26/2014   Influenza, Seasonal, Injecte, Preservative Fre 12/05/2010, 12/23/2015   Influenza,inj,Quad PF,6+ Mos 12/05/2017   Influenza-Unspecified 03/30/2008, 11/22/2009, 02/11/2013, 12/18/2013, 11/25/2014   Moderna Sars-Covid-2 Vaccination 09/18/2019, 10/16/2019, 03/18/2020   PPD Test 10/03/2017   Pneumococcal Conjugate-13 02/27/2013   Pneumococcal Polysaccharide-23 04/07/2011   Tdap 09/10/2013     Objective: Vital Signs: There were no vitals taken for this visit.   Physical Exam Vitals and nursing note reviewed.  Constitutional:      Appearance: She is well-developed.  HENT:     Head: Normocephalic and atraumatic.  Eyes:     Conjunctiva/sclera: Conjunctivae normal.  Cardiovascular:     Rate and Rhythm: Normal rate and regular rhythm.  Heart sounds: Normal heart sounds.  Pulmonary:     Effort: Pulmonary effort is normal.     Breath sounds: Normal breath sounds.  Abdominal:     General: Bowel sounds are normal.     Palpations: Abdomen is soft.  Musculoskeletal:     Cervical back: Normal range of motion.  Lymphadenopathy:     Cervical: No cervical adenopathy.  Skin:    General: Skin is warm and dry.     Capillary Refill: Capillary refill takes less than 2 seconds.  Neurological:     Mental Status: She is alert and oriented to person, place, and time.  Psychiatric:        Behavior: Behavior normal.      Musculoskeletal Exam: ***  CDAI  Exam: CDAI Score: -- Patient Global: --; Provider Global: -- Swollen: --; Tender: -- Joint Exam 04/10/2023   No joint exam has been documented for this visit   There is currently no information documented on the homunculus. Go to the Rheumatology activity and complete the homunculus joint exam.  Investigation: No additional findings.  Imaging: No results found.  Recent Labs: Lab Results  Component Value Date   WBC 9.3 09/06/2022   HGB 12.4 09/06/2022   PLT 293 09/06/2022   NA 137 09/06/2022   K 4.3 09/06/2022   CL 105 09/06/2022   CO2 22 09/06/2022   GLUCOSE 101 (H) 09/06/2022   BUN 13 09/06/2022   CREATININE 0.99 09/06/2022   BILITOT 0.3 09/06/2022   ALKPHOS 71 04/04/2022   AST 10 09/06/2022   ALT 10 09/06/2022   PROT 7.8 09/06/2022   ALBUMIN 4.4 04/04/2022   CALCIUM 9.5 09/06/2022   GFRAA 103 06/21/2020    Speciality Comments: PLQ Eye Exam: 05/03/2022 WNL @ Dr[facility] follow up in 1 year  Procedures:  No procedures performed Allergies: Lacosamide, Peanut oil, Penicillins, Prochlorperazine, Soybean-containing drug products, Sulfa antibiotics, Wheat, Cetirizine, Ciprofloxacin, Iodinated contrast media, Lisinopril, Naproxen, Nitrofurantoin, Other, Pregabalin, Codeine, Compazine, Gabapentin, Lipitor [atorvastatin], Nitrofurantoin monohyd macro, Sulfasalazine, and Dexamethasone   Assessment / Plan:     Visit Diagnoses: Rheumatoid arthritis involving multiple sites with positive rheumatoid factor (HCC)  High risk medication use  Chronic pain of both shoulders  Primary osteoarthritis of both hands  Fibromyalgia  Trapezius muscle spasm  History of vitamin D deficiency  Positive PPD  Moderate persistent asthma with acute exacerbation  History of pulmonary embolism  History of depression  History of hypertension  History of psychosis  Orders: No orders of the defined types were placed in this encounter.  No orders of the  defined types were placed in this encounter.   Face-to-face time spent with patient was *** minutes. Greater than 50% of time was spent in counseling and coordination of care.  Follow-Up Instructions: No follow-ups on file.   Gearldine Bienenstock, PA-C  Note - This record has been created using Dragon software.  Chart creation errors have been sought, but may not always  have been located. Such creation errors do not reflect on  the standard of medical care.

## 2023-04-02 ENCOUNTER — Other Ambulatory Visit: Payer: Self-pay | Admitting: Physician Assistant

## 2023-04-02 DIAGNOSIS — M0579 Rheumatoid arthritis with rheumatoid factor of multiple sites without organ or systems involvement: Secondary | ICD-10-CM

## 2023-04-03 NOTE — Telephone Encounter (Signed)
 Last Fill: 11/06/2022  Eye exam: 05/03/2022 WNL    Labs: 09/06/2022 CBC and CMP WNL   Next Visit: 04/10/2023  Last Visit: 09/06/2022  DX::Rheumatoid arthritis involving multiple sites with positive rheumatoid factor   Current Dose per office note 09/06/2022: Plaquenil  200 mg 1 tablet by mouth twice daily   Patient to update labs at upcoming appointment on 04/10/2023  Okay to refill Plaquenil ?

## 2023-04-10 ENCOUNTER — Telehealth: Payer: Self-pay | Admitting: Physician Assistant

## 2023-04-10 ENCOUNTER — Ambulatory Visit: Payer: 59 | Admitting: Physician Assistant

## 2023-04-10 DIAGNOSIS — Z86711 Personal history of pulmonary embolism: Secondary | ICD-10-CM

## 2023-04-10 DIAGNOSIS — Z8659 Personal history of other mental and behavioral disorders: Secondary | ICD-10-CM

## 2023-04-10 DIAGNOSIS — M0579 Rheumatoid arthritis with rheumatoid factor of multiple sites without organ or systems involvement: Secondary | ICD-10-CM

## 2023-04-10 DIAGNOSIS — Z8639 Personal history of other endocrine, nutritional and metabolic disease: Secondary | ICD-10-CM

## 2023-04-10 DIAGNOSIS — G8929 Other chronic pain: Secondary | ICD-10-CM

## 2023-04-10 DIAGNOSIS — M62838 Other muscle spasm: Secondary | ICD-10-CM

## 2023-04-10 DIAGNOSIS — M19041 Primary osteoarthritis, right hand: Secondary | ICD-10-CM

## 2023-04-10 DIAGNOSIS — M797 Fibromyalgia: Secondary | ICD-10-CM

## 2023-04-10 DIAGNOSIS — Z8679 Personal history of other diseases of the circulatory system: Secondary | ICD-10-CM

## 2023-04-10 DIAGNOSIS — R7611 Nonspecific reaction to tuberculin skin test without active tuberculosis: Secondary | ICD-10-CM

## 2023-04-10 DIAGNOSIS — Z79899 Other long term (current) drug therapy: Secondary | ICD-10-CM

## 2023-04-10 DIAGNOSIS — J4541 Moderate persistent asthma with (acute) exacerbation: Secondary | ICD-10-CM

## 2023-04-10 NOTE — Telephone Encounter (Signed)
I called patient, patient is due for labs, patient r/s 04/17/2023

## 2023-04-10 NOTE — Telephone Encounter (Signed)
Pt would like to know if she can do her appt virtual due to weather. Pt stated she did not want to reschedule due to her canceling her last two visits with Ladona Ridgel. Pts appointment is today at 04/10/23. I let pt know we are short staffed and will call her back before her appt to reschedule if not able to.

## 2023-04-11 ENCOUNTER — Ambulatory Visit: Payer: 59 | Admitting: Allergy & Immunology

## 2023-04-12 NOTE — Progress Notes (Deleted)
 Office Visit Note  Patient: Haley Goodman             Date of Birth: 09/12/1980           MRN: [id number]             PCP: Haley Rochester, MD Referring: Haley Rochester, MD Visit Date: 04/17/2023 Occupation: @GUAROCC @  Subjective:    History of Present Illness: Haley Goodman is a 43 y.o. female with history of seropositive rheumatoid arthritis and osteoarthritis.  Patient remains on plaquenil 200 mg 1 tablet by mouth twice daily.   CBC and CMP updated on 09/06/22. Orders for CBC and CMP released today.  PLQ Eye Exam: 05/03/2022 WNL @ [facility] follow up in 1 year   Activities of Daily Living:  Patient reports morning stiffness for *** {minute/hour:19697}.   Patient {ACTIONS;DENIES/REPORTS:21021675::"Denies"} nocturnal pain.  Difficulty dressing/grooming: {ACTIONS;DENIES/REPORTS:21021675::"Denies"} Difficulty climbing stairs: {ACTIONS;DENIES/REPORTS:21021675::"Denies"} Difficulty getting out of chair: {ACTIONS;DENIES/REPORTS:21021675::"Denies"} Difficulty using hands for taps, buttons, cutlery, and/or writing: {ACTIONS;DENIES/REPORTS:21021675::"Denies"}  No Rheumatology ROS completed.   PMFS History:  Patient Active Problem List   Diagnosis Date Noted  . Subdural hematoma (HCC)   . Mild neurocognitive disorder due to multiple etiologies 03/22/2021  . History of subdural hemorrhage   . Migraine headaches   . Weakness of right side of body   . Ruptured aneurysm of artery   . Hypercholesterolemia 03/01/2021  . Immunodeficiency due to drugs 03/01/2021  . Neck pain 08/24/2020  . Osteoarthritis (arthritis due to wear and tear of joints) 04/16/2020  . History of colonic polyps 01/02/2020  . Allergic rhinitis 07/25/2019  . GERD (gastroesophageal reflux disease) 07/25/2019  . Endometritis 07/14/2019  . Chronic low back pain 06/11/2019  . Dry eyes 09/04/2018  . Macromastia 09/04/2018  . Degeneration of lumbar intervertebral disc  09/04/2018  . Chronic anticoagulation 08/07/2017  . Pelvic pain in female 04/11/2017  . Major depressive disorder   . Fibromyalgia 05/10/2016  . Chronic pain syndrome 05/10/2016  . High risk medication use/ Plaquenil 05/10/2016  . Spondylosis of lumbar region without myelopathy or radiculopathy 05/10/2016  . Abnormal serum protein electrophoresis 05/10/2016  . Vitamin D deficiency 05/10/2016  . Rheumatoid arthritis involving multiple sites with positive rheumatoid factor   . 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 Type 2 diabetes mellitus without complication, with long-term current use of insulin 02/27/2014  . Obesity 09/10/2013  . Alopecia 09/10/2013  . Bipolar disorder in partial remission 09/10/2013  . Cerebral infarction 09/10/2013  . Pulmonary embolism without acute cor pulmonale (HCC) 03/05/2009  . Elevated liver enzymes 03/05/2009  . Hypercoagulable state, secondary 02/15/2009  . S/P cholecystectomy 02/15/2009  . Primary insomnia 04/27/2008  . Essential hypertension, benign 03/30/2008  . Systemic lupus erythematosus 03/30/2008    Past Medical History:  Diagnosis Date  . Abnormal serum protein electrophoresis 05/10/2016   Labs from April 23, 2015, SPEP and M-Spike is negative except 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 Overview:  Labs from April 23, 2015, SPEP and M-Spike is negative except she does have polyclonal gammopathy IgA kappa lambda and she is being seen by hematology Formatting of this  . Allergic rhinitis 07/25/2019  . Alopecia 09/10/2013   ? Related to Lupus, better on Plaquenil  . Anemia due to acute blood loss 02/15/2009  . Arthritis   . Bipolar disorder in partial remission 09/10/2013   Hx  psychosis/ hallucinations, last in 2012  . Cerebral infarction 09/10/2013  . Chronic anticoagulation 08/07/2017  . Chronic low back pain 06/11/2019  .  Chronic pain syndrome 05/10/2016  . Elevated liver enzymes 03/05/2009  . Essential hypertension, benign 03/30/2008  . Fibromyalgia   . GERD (gastroesophageal reflux disease) 07/25/2019  . History of colonic polyps 01/02/2020   Negative colonoscopy performed in November 2021  . History of subdural hemorrhage   . Hypercholesterolemia 03/01/2021  . Hypercoagulable state, secondary 02/15/2009  . Immunodeficiency due to drugs 03/01/2021  . Major depressive disorder   . Migraine headaches   . Mild neurocognitive disorder due to multiple etiologies 03/22/2021  . Neck pain 08/24/2020  . Osteoarthritis (arthritis due to wear and tear of joints) 04/16/2020  . PE (pulmonary embolism) 2009  . Pelvic pain in female 04/11/2017  . 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 Primary insomnia 04/27/2008  . Pulmonary embolism without acute cor pulmonale 03/05/2009   Lifelong coumadin Anticoagulation; IMOLOAD 2017 R1.1  . Rheumatoid arthritis involving multiple sites with positive rheumatoid factor    Positive RF and positive anti-CCP  Formatting of this note might be different from the original. Overview:  Positive RF and positive anti-CCP  . 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 S/P cholecystectomy 02/15/2009  . Spondylosis of lumbar region without myelopathy or radiculopathy 05/10/2016  . Subdural hematoma (HCC)   . Systemic lupus erythematosus 03/30/2008   scalp skin lesions, on Plaquenil, 2009  . Type 2 diabetes mellitus without complication, with long-term current use of insulin 02/27/2014   steroid induced  . Upper respiratory tract infection 10/02/2018  . Vitamin D deficiency 05/10/2016  . Weakness of right side of body     Family History  Problem Relation Age of Onset  . Stroke Mother   . Cancer Mother   . Cancer Sister   . Dementia Maternal Grandmother 84  . Cancer Other   .  Diabetes Other   . Dementia Maternal Great-grandmother 41  . Pseudochol deficiency Neg Hx   . Malignant hyperthermia Neg Hx   . Hypotension Neg Hx   . Anesthesia problems Neg Hx    Past Surgical History:  Procedure Laterality Date  . ABLATION    . BRAIN SURGERY    . BREAST REDUCTION SURGERY  01/28/2020   Dr. Deforest Hoyles at [facility]  . CHOLECYSTECTOMY    . DILATION AND CURETTAGE OF UTERUS    . HYSTEROSCOPY  12/30/2019   Dr. Rosalia Hammers  . TUBAL LIGATION     Social History   Social History Narrative  . Not on file   Immunization History  Administered Date(s) Administered  . Influenza Split 01/26/2014  . Influenza, Seasonal, Injecte, Preservative Fre 12/05/2010, 12/23/2015  . Influenza,inj,Quad PF,6+ Mos 12/05/2017  . Influenza-Unspecified 03/30/2008, 11/22/2009, 02/11/2013, 12/18/2013, 11/25/2014  . Moderna Sars-Covid-2 Vaccination 09/18/2019, 10/16/2019, 03/18/2020  . PPD Test 10/03/2017  . Pneumococcal Conjugate-13 02/27/2013  . Pneumococcal Polysaccharide-23 04/07/2011  . Tdap 09/10/2013     Objective: Vital Signs: There were no vitals taken for this visit.   Physical Exam Vitals and nursing note reviewed.  Constitutional:      Appearance: She is well-developed.  HENT:     Head: Normocephalic and atraumatic.  Eyes:     Conjunctiva/sclera: Conjunctivae normal.  Cardiovascular:     Rate and Rhythm: Normal rate and regular rhythm.  Heart sounds: Normal heart sounds.  Pulmonary:     Effort: Pulmonary effort is normal.     Breath sounds: Normal breath sounds.  Abdominal:     General: Bowel sounds are normal.     Palpations: Abdomen is soft.  Musculoskeletal:     Cervical back: Normal range of motion.  Lymphadenopathy:     Cervical: No cervical adenopathy.  Skin:    General: Skin is warm and dry.     Capillary Refill: Capillary refill takes less than 2 seconds.  Neurological:     Mental Status: She is alert and oriented to person, place, and  time.  Psychiatric:        Behavior: Behavior normal.     Musculoskeletal Exam: ***  CDAI Exam: CDAI Score: -- Patient Global: --; Provider Global: -- Swollen: --; Tender: -- Joint Exam 04/17/2023   No joint exam has been documented for this visit   There is currently no information documented on the homunculus. Go to the Rheumatology activity and complete the homunculus joint exam.  Investigation: No additional findings.  Imaging: No results found.  Recent Labs: Lab Results  Component Value Date   WBC 9.3 09/06/2022   HGB 12.4 09/06/2022   PLT 293 09/06/2022   NA 137 09/06/2022   K 4.3 09/06/2022   CL 105 09/06/2022   CO2 22 09/06/2022   GLUCOSE 101 (H) 09/06/2022   BUN 13 09/06/2022   CREATININE 0.99 09/06/2022   BILITOT 0.3 09/06/2022   ALKPHOS 71 04/04/2022   AST 10 09/06/2022   ALT 10 09/06/2022   PROT 7.8 09/06/2022   ALBUMIN 4.4 04/04/2022   CALCIUM 9.5 09/06/2022   GFRAA 103 06/21/2020    Speciality Comments: PLQ Eye Exam: 05/03/2022 WNL @ [facility] follow up in 1 year  Procedures:  No procedures performed Allergies: Lacosamide, Peanut oil, Penicillins, Prochlorperazine, Soybean-containing drug products, Sulfa antibiotics, Wheat, Cetirizine, Ciprofloxacin, Iodinated contrast media, Lisinopril, Naproxen, Nitrofurantoin, Other, Pregabalin, Codeine, Compazine, Gabapentin, Lipitor [atorvastatin], Nitrofurantoin monohyd macro, Sulfasalazine, and Dexamethasone   Assessment / Plan:     Visit Diagnoses: No diagnosis found.  Orders: No orders of the defined types were placed in this encounter.  No orders of the defined types were placed in this encounter.   Face-to-face time spent with patient was *** minutes. Greater than 50% of time was spent in counseling and coordination of care.  Follow-Up Instructions: No follow-ups on file.   Gearldine Bienenstock, PA-C  Note - This record has been created using Dragon software.  Chart  creation errors have been sought, but may not always  have been located. Such creation errors do not reflect on  the standard of medical care.

## 2023-04-17 ENCOUNTER — Ambulatory Visit: Payer: 59 | Admitting: Physician Assistant

## 2023-04-17 DIAGNOSIS — Z8659 Personal history of other mental and behavioral disorders: Secondary | ICD-10-CM

## 2023-04-17 DIAGNOSIS — G8929 Other chronic pain: Secondary | ICD-10-CM

## 2023-04-17 DIAGNOSIS — M797 Fibromyalgia: Secondary | ICD-10-CM

## 2023-04-17 DIAGNOSIS — M62838 Other muscle spasm: Secondary | ICD-10-CM

## 2023-04-17 DIAGNOSIS — R7611 Nonspecific reaction to tuberculin skin test without active tuberculosis: Secondary | ICD-10-CM

## 2023-04-17 DIAGNOSIS — Z86711 Personal history of pulmonary embolism: Secondary | ICD-10-CM

## 2023-04-17 DIAGNOSIS — Z8639 Personal history of other endocrine, nutritional and metabolic disease: Secondary | ICD-10-CM

## 2023-04-17 DIAGNOSIS — Z79899 Other long term (current) drug therapy: Secondary | ICD-10-CM

## 2023-04-17 DIAGNOSIS — M19041 Primary osteoarthritis, right hand: Secondary | ICD-10-CM

## 2023-04-17 DIAGNOSIS — Z8679 Personal history of other diseases of the circulatory system: Secondary | ICD-10-CM

## 2023-04-17 DIAGNOSIS — J4541 Moderate persistent asthma with (acute) exacerbation: Secondary | ICD-10-CM

## 2023-04-17 DIAGNOSIS — M0579 Rheumatoid arthritis with rheumatoid factor of multiple sites without organ or systems involvement: Secondary | ICD-10-CM

## 2023-04-23 NOTE — Progress Notes (Deleted)
 Office Visit Note  Patient: Haley Goodman             Date of Birth: 1980-06-03           MRN: [id number]             PCP: Stevphen Rochester, MD Referring: Stevphen Rochester, MD Visit Date: 05/07/2023 Occupation: @GUAROCC @  Subjective:  No chief complaint on file.   History of Present Illness: Haley Goodman is a 43 y.o. female ***     Activities of Daily Living:  Patient reports morning stiffness for *** {minute/hour:19697}.   Patient {ACTIONS;DENIES/REPORTS:21021675::"Denies"} nocturnal pain.  Difficulty dressing/grooming: {ACTIONS;DENIES/REPORTS:21021675::"Denies"} Difficulty climbing stairs: {ACTIONS;DENIES/REPORTS:21021675::"Denies"} Difficulty getting out of chair: {ACTIONS;DENIES/REPORTS:21021675::"Denies"} Difficulty using hands for taps, buttons, cutlery, and/or writing: {ACTIONS;DENIES/REPORTS:21021675::"Denies"}  No Rheumatology ROS completed.   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   History of subdural hemorrhage    Migraine headaches    Weakness of right side of body    Ruptured aneurysm of artery    Hypercholesterolemia 03/01/2021   Immunodeficiency due to drugs 03/01/2021   Neck pain 08/24/2020   Osteoarthritis (arthritis due to wear and tear of joints) 04/16/2020   History of colonic polyps 01/02/2020   Allergic rhinitis 07/25/2019   GERD (gastroesophageal reflux disease) 07/25/2019   Endometritis 07/14/2019   Chronic low back pain 06/11/2019   Dry eyes 09/04/2018   Macromastia 09/04/2018   Degeneration of lumbar intervertebral disc 09/04/2018   Chronic anticoagulation 08/07/2017   Pelvic pain in female 04/11/2017   Major depressive disorder    Fibromyalgia 05/10/2016   Chronic pain syndrome 05/10/2016   High risk medication use/ Plaquenil 05/10/2016   Spondylosis of lumbar region without myelopathy or  radiculopathy 05/10/2016   Abnormal serum protein electrophoresis 05/10/2016   Vitamin D deficiency 05/10/2016   Rheumatoid arthritis involving multiple sites with positive rheumatoid factor    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   Type 2 diabetes mellitus without complication, with long-term current use of insulin 02/27/2014   Obesity 09/10/2013   Alopecia 09/10/2013   Bipolar disorder in partial remission 09/10/2013   Cerebral infarction 09/10/2013   Pulmonary embolism without acute cor pulmonale (HCC) 03/05/2009   Elevated liver enzymes 03/05/2009   Hypercoagulable state, secondary 02/15/2009   S/P cholecystectomy 02/15/2009   Primary insomnia 04/27/2008   Essential hypertension, benign 03/30/2008   Systemic lupus erythematosus 03/30/2008    Past Medical History:  Diagnosis Date   Abnormal serum protein electrophoresis 05/10/2016   Labs from April 23, 2015, SPEP and M-Spike is negative except 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 Related to Lupus, better on Plaquenil   Anemia due to acute blood loss 02/15/2009   Arthritis    Bipolar disorder in partial remission 09/10/2013   Hx psychosis/ hallucinations, last in 2012   Cerebral infarction 09/10/2013   Chronic anticoagulation 08/07/2017   Chronic low back pain 06/11/2019   Chronic pain syndrome 05/10/2016   Elevated liver enzymes 03/05/2009   Essential hypertension, benign 03/30/2008   Fibromyalgia  GERD (gastroesophageal reflux disease) 07/25/2019   History of colonic polyps 01/02/2020   Negative colonoscopy performed in November 2021   History of subdural hemorrhage     Hypercholesterolemia 03/01/2021   Hypercoagulable state, secondary 02/15/2009   Immunodeficiency due to drugs 03/01/2021   Major depressive disorder    Migraine headaches    Mild neurocognitive disorder due to multiple etiologies 03/22/2021   Neck pain 08/24/2020   Osteoarthritis (arthritis due to wear and tear of joints) 04/16/2020   PE (pulmonary embolism) 2009   Pelvic pain in female 04/11/2017   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   S/P cholecystectomy 02/15/2009   Spondylosis of lumbar region without myelopathy or radiculopathy 05/10/2016   Subdural hematoma (HCC)    Systemic lupus erythematosus 03/30/2008   scalp skin lesions, on Plaquenil, 2009   Type 2 diabetes mellitus without complication, with long-term current use of insulin 02/27/2014   steroid induced   Upper respiratory tract infection 10/02/2018   Vitamin D deficiency 05/10/2016   Weakness of right side of body     Family History  Problem Relation Age of Onset   Stroke Mother    Cancer Mother    Cancer Sister    Dementia Maternal Grandmother 88   Cancer Other    Diabetes Other    Dementia Maternal Great-grandmother 17   Pseudochol deficiency Neg Hx    Malignant hyperthermia Neg Hx    Hypotension Neg Hx    Anesthesia problems Neg Hx    Past Surgical History:  Procedure Laterality Date   ABLATION     BRAIN SURGERY     BREAST REDUCTION SURGERY  01/28/2020   Dr. Deforest Hoyles at [facility]   CHOLECYSTECTOMY     DILATION AND CURETTAGE OF UTERUS     HYSTEROSCOPY  12/30/2019   Dr. Rosalia Hammers   TUBAL LIGATION     Social History   Social History Narrative   Not on file   Immunization History  Administered Date(s) Administered   Influenza Split 01/26/2014   Influenza, Seasonal, Injecte, Preservative Fre 12/05/2010, 12/23/2015   Influenza,inj,Quad PF,6+ Mos 12/05/2017   Influenza-Unspecified 03/30/2008, 11/22/2009, 02/11/2013, 12/18/2013, 11/25/2014   Moderna Sars-Covid-2 Vaccination 09/18/2019, 10/16/2019, 03/18/2020   PPD Test 10/03/2017   Pneumococcal Conjugate-13 02/27/2013   Pneumococcal Polysaccharide-23 04/07/2011   Tdap 09/10/2013     Objective: Vital Signs: There were no vitals taken for this visit.   Physical Exam   Musculoskeletal Exam: ***  CDAI Exam: CDAI Score: -- Patient Global: --; Provider Global: -- Swollen: --; Tender: -- Joint Exam 05/07/2023   No joint exam has been documented for this visit   There is currently no information documented on the homunculus. Go to the Rheumatology activity and complete the homunculus joint exam.  Investigation: No additional findings.  Imaging: No results found.  Recent Labs: Lab Results  Component Value Date   WBC 9.3 09/06/2022   HGB 12.4 09/06/2022   PLT 293 09/06/2022   NA 137 09/06/2022   K  4.3 09/06/2022   CL 105 09/06/2022   CO2 22 09/06/2022   GLUCOSE 101 (H) 09/06/2022   BUN 13 09/06/2022   CREATININE 0.99 09/06/2022   BILITOT 0.3 09/06/2022   ALKPHOS 71 04/04/2022   AST 10 09/06/2022   ALT 10 09/06/2022   PROT 7.8 09/06/2022   ALBUMIN 4.4 04/04/2022   CALCIUM 9.5 09/06/2022   GFRAA 103 06/21/2020    Speciality Comments: PLQ Eye Exam: 05/03/2022 WNL @ [facility] follow up in 1 year  Procedures:  No procedures performed Allergies: Lacosamide, Peanut oil, Penicillins, Prochlorperazine, Soybean-containing drug products, Sulfa antibiotics, Wheat,  Cetirizine, Ciprofloxacin, Iodinated contrast media, Lisinopril, Naproxen, Nitrofurantoin, Other, Pregabalin, Codeine, Compazine, Gabapentin, Lipitor [atorvastatin], Nitrofurantoin monohyd macro, Sulfasalazine, and Dexamethasone   Assessment / Plan:     Visit Diagnoses: No diagnosis found.  Orders: No orders of the defined types were placed in this encounter.  No orders of the defined types were placed in this encounter.   Face-to-face time spent with patient was *** minutes. Greater than 50% of time was spent in counseling and coordination of care.  Follow-Up Instructions: No follow-ups on file.   Ellen Henri, CMA  Note - This record has been created using Animal nutritionist.  Chart creation errors have been sought, but may not always  have been located. Such creation errors do not reflect on  the standard of medical care.

## 2023-04-27 ENCOUNTER — Other Ambulatory Visit: Payer: Self-pay | Admitting: Physician Assistant

## 2023-04-27 DIAGNOSIS — M0579 Rheumatoid arthritis with rheumatoid factor of multiple sites without organ or systems involvement: Secondary | ICD-10-CM

## 2023-04-27 NOTE — Telephone Encounter (Signed)
 Last Fill: 04/03/2023  Eye exam: 05/03/2022 WNL   Labs: 09/06/2022 CBC and CMP WNL   Next Visit: 05/07/2023  Last Visit: 09/06/2022  DX: Rheumatoid arthritis involving multiple sites with positive rheumatoid factor (HCC)  Current Dose per office note 09/06/2022:  Plaquenil 200 mg 1 tablet by mouth twice daily.   Patient to update labs at upcomming appointment.   Okay to refill Plaquenil?

## 2023-05-04 NOTE — Progress Notes (Signed)
 Office Visit Note  Patient: Haley Goodman             Date of Birth: 1980-05-08           MRN: [id number]             PCP: Stevphen Rochester, MD Referring: Stevphen Rochester, MD Visit Date: 05/07/2023 Occupation: @GUAROCC @  Subjective:  Recurrent infections   History of Present Illness: Haley Goodman is a 43 y.o. female with history of seropositive rheumatoid arthritis and osteoarthritis.  Patient remains on Plaquenil 200 mg 1 tablet by mouth twice daily.  She is tolerating Plaquenil without any side effects and has not had any recent gaps in therapy.  Patient denies any signs or symptoms of a rheumatoid arthritis flare.  Patient states that since her last office visit she has been experiencing recurrent infections.  She states that she was diagnosed with COVID-19 in March 2024 and since then has had recurrent ear infections.  She has been on several courses of antibiotics and steroids for symptomatic relief.  According to the patient she is scheduled on 05/25/2023 for tonsillectomy and adenoid removal. She will also be getting tubes in her ears.     Activities of Daily Living:  Patient reports morning stiffness for a few minutes.   Patient Reports nocturnal pain.  Difficulty dressing/grooming: Denies Difficulty climbing stairs: Denies Difficulty getting out of chair: Denies Difficulty using hands for taps, buttons, cutlery, and/or writing: Denies  Review of Systems  Constitutional:  Negative for fatigue.  HENT:  Negative for mouth sores, mouth dryness and nose dryness.   Eyes:  Negative for pain and dryness.  Respiratory:  Negative for shortness of breath and difficulty breathing.   Cardiovascular:  Negative for chest pain and palpitations.  Gastrointestinal:  Negative for blood in stool, constipation and diarrhea.  Endocrine: Negative for increased urination.  Genitourinary:  Negative for involuntary urination.  Musculoskeletal:  Positive for morning stiffness.  Negative for joint pain, gait problem, joint pain, joint swelling, myalgias, muscle weakness, muscle tenderness and myalgias.  Skin:  Negative for color change, rash, hair loss and sensitivity to sunlight.  Allergic/Immunologic: Negative for susceptible to infections.  Neurological:  Negative for dizziness and headaches.  Hematological:  Negative for swollen glands.  Psychiatric/Behavioral:  Negative for depressed mood and sleep disturbance. The patient is not nervous/anxious.     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   History of subdural hemorrhage    Migraine headaches    Weakness of right side of body    Ruptured aneurysm of artery    Hypercholesterolemia 03/01/2021   Immunodeficiency due to drugs 03/01/2021   Neck pain 08/24/2020   Osteoarthritis (arthritis due to wear and tear of joints) 04/16/2020   History of colonic polyps 01/02/2020   Allergic rhinitis 07/25/2019   GERD (gastroesophageal reflux disease) 07/25/2019   Endometritis 07/14/2019   Chronic low back pain 06/11/2019   Dry eyes 09/04/2018   Macromastia 09/04/2018   Degeneration of lumbar intervertebral disc 09/04/2018   Chronic anticoagulation 08/07/2017   Pelvic pain in female 04/11/2017   Major depressive disorder    Fibromyalgia 05/10/2016   Chronic pain syndrome 05/10/2016   High risk medication use/ Plaquenil 05/10/2016   Spondylosis of lumbar region without myelopathy or radiculopathy 05/10/2016   Abnormal serum protein electrophoresis 05/10/2016   Vitamin D deficiency 05/10/2016  Rheumatoid arthritis involving multiple sites with positive rheumatoid factor    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   Type 2 diabetes mellitus without complication, with long-term current use of insulin 02/27/2014   Obesity 09/10/2013   Alopecia 09/10/2013   Bipolar disorder in partial remission  09/10/2013   Cerebral infarction 09/10/2013   Pulmonary embolism without acute cor pulmonale (HCC) 03/05/2009   Elevated liver enzymes 03/05/2009   Hypercoagulable state, secondary 02/15/2009   S/P cholecystectomy 02/15/2009   Primary insomnia 04/27/2008   Essential hypertension, benign 03/30/2008   Systemic lupus erythematosus 03/30/2008    Past Medical History:  Diagnosis Date   Abnormal serum protein electrophoresis 05/10/2016   Labs from April 23, 2015, SPEP and M-Spike is negative except 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 Related to Lupus, better on Plaquenil   Anemia due to acute blood loss 02/15/2009   Arthritis    Bipolar disorder in partial remission 09/10/2013   Hx psychosis/ hallucinations, last in 2012   Cerebral infarction 09/10/2013   Chronic anticoagulation 08/07/2017   Chronic low back pain 06/11/2019   Chronic pain syndrome 05/10/2016   Elevated liver enzymes 03/05/2009   Essential hypertension, benign 03/30/2008   Fibromyalgia    GERD (gastroesophageal reflux disease) 07/25/2019   History of colonic polyps 01/02/2020   Negative colonoscopy performed in November 2021   History of subdural hemorrhage    Hypercholesterolemia 03/01/2021   Hypercoagulable state, secondary 02/15/2009   Immunodeficiency due to drugs 03/01/2021   Major depressive disorder    Migraine headaches    Mild neurocognitive disorder due to multiple etiologies 03/22/2021   Neck pain 08/24/2020   Osteoarthritis (arthritis due to wear and tear of joints) 04/16/2020   PE (pulmonary embolism) 2009   Pelvic pain in female 04/11/2017    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   S/P cholecystectomy 02/15/2009   Spondylosis of lumbar region without myelopathy or radiculopathy 05/10/2016   Subdural hematoma (HCC)    Systemic lupus erythematosus 03/30/2008   scalp skin lesions, on Plaquenil, 2009   Type 2 diabetes mellitus without complication, with long-term current use of insulin 02/27/2014   steroid induced   Upper respiratory tract infection 10/02/2018   Vitamin D deficiency 05/10/2016   Weakness of right side of body     Family History  Problem Relation Age of Onset   Stroke Mother    Cancer Mother    Cancer Sister    Dementia Maternal Grandmother 28   Cancer Other    Diabetes Other    Dementia Maternal Great-grandmother 35   Pseudochol deficiency Neg Hx    Malignant hyperthermia Neg Hx    Hypotension Neg  Hx    Anesthesia problems Neg Hx    Past Surgical History:  Procedure Laterality Date   ABLATION     BRAIN SURGERY     BREAST REDUCTION SURGERY  01/28/2020   Dr. Deforest Hoyles at [facility]   CHOLECYSTECTOMY     DILATION AND CURETTAGE OF UTERUS     HYSTEROSCOPY  12/30/2019   Dr. Rosalia Hammers   TUBAL LIGATION     Social History   Social History Narrative   Not on file   Immunization History  Administered Date(s) Administered   Influenza Split 01/26/2014   Influenza, Seasonal, Injecte, Preservative Fre 12/05/2010, 12/23/2015   Influenza,inj,Quad PF,6+ Mos 12/05/2017    Influenza-Unspecified 03/30/2008, 11/22/2009, 02/11/2013, 12/18/2013, 11/25/2014   Moderna Sars-Covid-2 Vaccination 09/18/2019, 10/16/2019, 03/18/2020   PPD Test 10/03/2017   Pneumococcal Conjugate-13 02/27/2013   Pneumococcal Polysaccharide-23 04/07/2011   Tdap 09/10/2013     Objective: Vital Signs: BP 123/85 (BP Location: Left Arm, Patient Position: Sitting, Cuff Size: Large)   Pulse 80   Resp 15   Ht 5\' 7"  (1.702 m)   Wt 219 lb 6.4 oz (99.5 kg)   BMI 34.36 kg/m    Physical Exam Vitals and nursing note reviewed.  Constitutional:      Appearance: She is well-developed.  HENT:     Head: Normocephalic and atraumatic.  Eyes:     Conjunctiva/sclera: Conjunctivae normal.  Cardiovascular:     Rate and Rhythm: Normal rate and regular rhythm.     Heart sounds: Normal heart sounds.  Pulmonary:     Effort: Pulmonary effort is normal.     Breath sounds: Normal breath sounds.  Abdominal:     General: Bowel sounds are normal.     Palpations: Abdomen is soft.  Musculoskeletal:     Cervical back: Normal range of motion.  Lymphadenopathy:     Cervical: No cervical adenopathy.  Skin:    General: Skin is warm and dry.     Capillary Refill: Capillary refill takes less than 2 seconds.  Neurological:     Mental Status: She is alert and oriented to person, place, and time.  Psychiatric:        Behavior: Behavior normal.      Musculoskeletal Exam: C-spine, thoracic spine, lumbar spine have good range of motion.  Shoulder joints, elbow joints, wrist joints, MCPs, PIPs, DIPs have good range of motion with no synovitis.  Complete fist formation bilaterally.  Hip joints have good range of motion with no groin pain.  Knee joints have good range of motion with no warmth or effusion.  Ankle joints have good range of motion with no tenderness or joint swelling.  CDAI Exam: CDAI Score: -- Patient Global: --; Provider Global: -- Swollen: --; Tender: -- Joint Exam 05/07/2023   No joint exam  has been documented for this visit   There is currently no information documented on the homunculus. Go to the Rheumatology activity and complete the homunculus joint exam.  Investigation: No additional findings.  Imaging: No results found.  Recent Labs: Lab Results  Component Value Date   WBC 9.3 09/06/2022   HGB 12.4 09/06/2022   PLT 293 09/06/2022   NA 137 09/06/2022   K 4.3 09/06/2022   CL 105 09/06/2022   CO2 22 09/06/2022   GLUCOSE 101 (H) 09/06/2022   BUN 13 09/06/2022   CREATININE 0.99 09/06/2022   BILITOT 0.3 09/06/2022   ALKPHOS 71 04/04/2022   AST 10 09/06/2022   ALT 10 09/06/2022  PROT 7.8 09/06/2022   ALBUMIN 4.4 04/04/2022   CALCIUM 9.5 09/06/2022   GFRAA 103 06/21/2020    Speciality Comments: PLQ Eye Exam: 05/03/2022 WNL @ Dr[facility] follow up in 1 year  Procedures:  No procedures performed Allergies: Lacosamide, Peanut oil, Penicillins, Prochlorperazine, Soybean-containing drug products, Sulfa antibiotics, Wheat, Cetirizine, Ciprofloxacin, Iodinated contrast media, Lisinopril, Naproxen, Nitrofurantoin, Other, Pregabalin, Codeine, Compazine, Gabapentin, Lipitor [atorvastatin], Nitrofurantoin monohyd macro, Sulfasalazine, and Dexamethasone    Assessment / Plan:     Visit Diagnoses: Rheumatoid arthritis involving multiple sites with positive rheumatoid factor (HCC): She has no synovitis on examination today.  She has not had any signs or symptoms of a rheumatoid arthritis flare.  No difficulty with ADLs.  She has clinically been doing well taking Plaquenil 200 mg 1 tablet by mouth twice daily.  She is tolerating Plaquenil without any side effects and has not had any recent gaps in therapy.  No medication changes will be made at this time.  Patient was strongly encouraged to continue water exercise.  She was advised to notify us if she develops signs or symptoms of a flare.  She will follow-up in the office in 5 months or sooner if  needed.  High risk medication use - Plaquenil 200 mg 1 tablet by mouth twice daily.  PLQ Eye Exam: 05/03/2022 WNL @ Dr. [facility] follow up in 1 year.  Patient states that she is scheduled for an updated Plaquenil examination in April 2025.  She was given a Plaquenil eye examination form to take with her to her upcoming appointment. CBC and CMP updated on 09/06/22.  Order for CBC and CMP released today.  - Plan: CBC with Differential/Platelet, COMPLETE METABOLIC PANEL WITH GFR  Recurrent infections: Patient was diagnosed with COVID-19 for the fourth time in March 2024.  Patient states that since then she has been experiencing recurrent infections particularly ear infections.  Patient states that she has been on several courses of antibiotics and steroids.  She is scheduled to have tubes in both ears as well as a tonsillectomy and adenoidectomy on 05/25/23.    Chronic pain of both shoulders: Not currently symptomatic.  Good range of motion with no discomfort.  Primary osteoarthritis of both hands: No tenderness or synovitis.  Fibromyalgia: She has been going to water exercise twice a week which has helped to manage myofascial pain.  Trapezius muscle spasm: Not currently symptomatic.  History of vitamin D deficiency  Other medical conditions are listed as follows:  Positive PPD  History of pulmonary embolism  History of depression  Moderate persistent asthma with acute exacerbation  History of hypertension: Blood pressure was 123/85 today in the office.  History of psychosis  Orders: Orders Placed This Encounter  Procedures   CBC with Differential/Platelet   COMPLETE METABOLIC PANEL WITH GFR   No orders of the defined types were placed in this encounter.     Follow-Up Instructions: Return in about 5 months (around 10/07/2023) for Rheumatoid arthritis, Osteoarthritis.   Gearldine Bienenstock, PA-C  Note - This record has been created using Dragon software.   Chart creation errors have been sought, but may not always  have been located. Such creation errors do not reflect on  the standard of medical care.

## 2023-05-07 ENCOUNTER — Ambulatory Visit: Attending: Physician Assistant | Admitting: Physician Assistant

## 2023-05-07 ENCOUNTER — Ambulatory Visit: Payer: 59 | Admitting: Physician Assistant

## 2023-05-07 ENCOUNTER — Encounter: Payer: Self-pay | Admitting: Physician Assistant

## 2023-05-07 VITALS — BP 123/85 | HR 80 | Resp 15 | Ht 67.0 in | Wt 219.4 lb

## 2023-05-07 DIAGNOSIS — Z79899 Other long term (current) drug therapy: Secondary | ICD-10-CM

## 2023-05-07 DIAGNOSIS — R7611 Nonspecific reaction to tuberculin skin test without active tuberculosis: Secondary | ICD-10-CM

## 2023-05-07 DIAGNOSIS — J4541 Moderate persistent asthma with (acute) exacerbation: Secondary | ICD-10-CM

## 2023-05-07 DIAGNOSIS — G8929 Other chronic pain: Secondary | ICD-10-CM

## 2023-05-07 DIAGNOSIS — M25512 Pain in left shoulder: Secondary | ICD-10-CM

## 2023-05-07 DIAGNOSIS — Z8679 Personal history of other diseases of the circulatory system: Secondary | ICD-10-CM

## 2023-05-07 DIAGNOSIS — M25511 Pain in right shoulder: Secondary | ICD-10-CM | POA: Diagnosis not present

## 2023-05-07 DIAGNOSIS — B999 Unspecified infectious disease: Secondary | ICD-10-CM

## 2023-05-07 DIAGNOSIS — M19042 Primary osteoarthritis, left hand: Secondary | ICD-10-CM

## 2023-05-07 DIAGNOSIS — M797 Fibromyalgia: Secondary | ICD-10-CM

## 2023-05-07 DIAGNOSIS — M19041 Primary osteoarthritis, right hand: Secondary | ICD-10-CM | POA: Diagnosis not present

## 2023-05-07 DIAGNOSIS — M62838 Other muscle spasm: Secondary | ICD-10-CM

## 2023-05-07 DIAGNOSIS — Z8659 Personal history of other mental and behavioral disorders: Secondary | ICD-10-CM

## 2023-05-07 DIAGNOSIS — M0579 Rheumatoid arthritis with rheumatoid factor of multiple sites without organ or systems involvement: Secondary | ICD-10-CM

## 2023-05-07 DIAGNOSIS — Z86711 Personal history of pulmonary embolism: Secondary | ICD-10-CM

## 2023-05-07 DIAGNOSIS — Z8639 Personal history of other endocrine, nutritional and metabolic disease: Secondary | ICD-10-CM

## 2023-05-08 LAB — CBC WITH DIFFERENTIAL/PLATELET
Absolute Lymphocytes: 2898 {cells}/uL (ref 850–3900)
Absolute Monocytes: 563 {cells}/uL (ref 200–950)
Basophils Absolute: 17 {cells}/uL (ref 0–200)
Basophils Relative: 0.2 %
Eosinophils Absolute: 0 {cells}/uL — ABNORMAL LOW (ref 15–500)
Eosinophils Relative: 0 %
HCT: 37.7 % (ref 35.0–45.0)
Hemoglobin: 12 g/dL (ref 11.7–15.5)
MCH: 26.2 pg — ABNORMAL LOW (ref 27.0–33.0)
MCHC: 31.8 g/dL — ABNORMAL LOW (ref 32.0–36.0)
MCV: 82.3 fL (ref 80.0–100.0)
MPV: 9.9 fL (ref 7.5–12.5)
Monocytes Relative: 6.7 %
Neutro Abs: 4922 {cells}/uL (ref 1500–7800)
Neutrophils Relative %: 58.6 %
Platelets: 276 10*3/uL (ref 140–400)
RBC: 4.58 10*6/uL (ref 3.80–5.10)
RDW: 14.2 % (ref 11.0–15.0)
Total Lymphocyte: 34.5 %
WBC: 8.4 10*3/uL (ref 3.8–10.8)

## 2023-05-08 LAB — COMPLETE METABOLIC PANEL WITH GFR
AG Ratio: 1.3 (calc) (ref 1.0–2.5)
ALT: 13 U/L (ref 6–29)
AST: 11 U/L (ref 10–30)
Albumin: 4.2 g/dL (ref 3.6–5.1)
Alkaline phosphatase (APISO): 76 U/L (ref 31–125)
BUN: 11 mg/dL (ref 7–25)
CO2: 24 mmol/L (ref 20–32)
Calcium: 9.3 mg/dL (ref 8.6–10.2)
Chloride: 105 mmol/L (ref 98–110)
Creat: 0.82 mg/dL (ref 0.50–0.99)
Globulin: 3.2 g/dL (ref 1.9–3.7)
Glucose, Bld: 129 mg/dL — ABNORMAL HIGH (ref 65–99)
Potassium: 4.3 mmol/L (ref 3.5–5.3)
Sodium: 137 mmol/L (ref 135–146)
Total Bilirubin: 0.5 mg/dL (ref 0.2–1.2)
Total Protein: 7.4 g/dL (ref 6.1–8.1)
eGFR: 90 mL/min/{1.73_m2} (ref >=60)

## 2023-05-08 NOTE — Progress Notes (Signed)
Glucose is 129.  Rest of CMP WNL.  CBC stable.

## 2023-05-14 ENCOUNTER — Ambulatory Visit: Admitting: Physician Assistant

## 2023-05-16 ENCOUNTER — Other Ambulatory Visit: Payer: Self-pay | Admitting: Allergy & Immunology

## 2023-05-17 NOTE — Telephone Encounter (Signed)
 Refill for Trelegy 200 mcg x 1 with no refills sent to [facility]. Patient is due for a 6 month follow up in March.

## 2023-05-29 ENCOUNTER — Other Ambulatory Visit: Payer: Self-pay | Admitting: *Deleted

## 2023-05-29 MED ORDER — ALBUTEROL SULFATE HFA 108 (90 BASE) MCG/ACT IN AERS: 2.0000 | INHALATION_SPRAY | Freq: Four times a day (QID) | RESPIRATORY_TRACT | 0 refills | Status: DC | PRN

## 2023-06-03 ENCOUNTER — Other Ambulatory Visit: Payer: Self-pay | Admitting: Physician Assistant

## 2023-06-03 DIAGNOSIS — M0579 Rheumatoid arthritis with rheumatoid factor of multiple sites without organ or systems involvement: Secondary | ICD-10-CM

## 2023-06-04 NOTE — Telephone Encounter (Signed)
 Last Fill: 04/30/2023 (30 day supply)   Eye exam: 05/03/2022 WNL    Labs: 05/07/2023 Glucose is 129.  Rest of CMP WNL. CBC stable.      Next Visit: 10/02/2023   Last Visit: 05/07/2023   DX: Rheumatoid arthritis involving multiple sites with positive rheumatoid factor (HCC)   Current Dose per office note 05/07/2023:  Plaquenil 200 mg 1 tablet by mouth twice daily.    Patient advised she is due to update her PLQ eye exam. Patient states she is scheduled for 06/14/2023 to update her eye exam.   Okay to refill Plaquenil?

## 2023-07-16 ENCOUNTER — Other Ambulatory Visit: Payer: Self-pay | Admitting: Allergy & Immunology

## 2023-07-18 ENCOUNTER — Other Ambulatory Visit: Payer: Self-pay

## 2023-08-02 ENCOUNTER — Other Ambulatory Visit: Payer: Self-pay | Admitting: Allergy & Immunology

## 2023-09-18 NOTE — Progress Notes (Deleted)
 Office Visit Note  Patient: Haley Goodman             Date of Birth: 1981/02/17           MRN: [id number]             PCP: Gladystine Erminio CROME, MD Referring: Gladystine Erminio CROME, MD Visit Date: 10/02/2023 Occupation: @GUAROCC @  Subjective:  No chief complaint on file.   History of Present Illness: Haley Goodman is a 43 y.o. female ***     Activities of Daily Living:  Patient reports morning stiffness for *** {minute/hour:19697}.   Patient {ACTIONS;DENIES/REPORTS:21021675::Denies} nocturnal pain.  Difficulty dressing/grooming: {ACTIONS;DENIES/REPORTS:21021675::Denies} Difficulty climbing stairs: {ACTIONS;DENIES/REPORTS:21021675::Denies} Difficulty getting out of chair: {ACTIONS;DENIES/REPORTS:21021675::Denies} Difficulty using hands for taps, buttons, cutlery, and/or writing: {ACTIONS;DENIES/REPORTS:21021675::Denies}  No Rheumatology ROS completed.   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   History of subdural hemorrhage    Migraine headaches    Weakness of right side of body    Ruptured aneurysm of artery    Hypercholesterolemia 03/01/2021   Immunodeficiency due to drugs 03/01/2021   Neck pain 08/24/2020   Osteoarthritis (arthritis due to wear and tear of joints) 04/16/2020   History of colonic polyps 01/02/2020   Allergic rhinitis 07/25/2019   GERD (gastroesophageal reflux disease) 07/25/2019   Endometritis 07/14/2019   Chronic low back pain 06/11/2019   Dry eyes 09/04/2018   Macromastia 09/04/2018   Degeneration of lumbar intervertebral disc 09/04/2018   Chronic anticoagulation 08/07/2017   Pelvic pain in female 04/11/2017   Major depressive disorder    Fibromyalgia 05/10/2016   Chronic pain syndrome 05/10/2016   High risk medication use/ Plaquenil  05/10/2016   Spondylosis of lumbar region without myelopathy or  radiculopathy 05/10/2016   Abnormal serum protein electrophoresis 05/10/2016   Vitamin D  deficiency 05/10/2016   Rheumatoid arthritis involving multiple sites with positive rheumatoid factor    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   Type 2 diabetes mellitus without complication, with long-term current use of insulin  02/27/2014   Obesity 09/10/2013   Alopecia 09/10/2013   Bipolar disorder in partial remission 09/10/2013   Cerebral infarction 09/10/2013   Pulmonary embolism without acute cor pulmonale (HCC) 03/05/2009   Elevated liver enzymes 03/05/2009   Hypercoagulable state, secondary 02/15/2009   S/P cholecystectomy 02/15/2009   Primary insomnia 04/27/2008   Essential hypertension, benign 03/30/2008   Systemic lupus erythematosus 03/30/2008    Past Medical History:  Diagnosis Date   Abnormal serum protein electrophoresis 05/10/2016   Labs from April 23, 2015, SPEP and M-Spike is negative except 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 Related to Lupus, better on Plaquenil    Anemia due to acute blood loss 02/15/2009   Arthritis    Bipolar disorder in partial remission 09/10/2013   Hx psychosis/ hallucinations, last in 2012   Cerebral infarction 09/10/2013   Chronic anticoagulation 08/07/2017   Chronic low back pain 06/11/2019   Chronic pain syndrome 05/10/2016   Elevated liver enzymes 03/05/2009   Essential hypertension, benign 03/30/2008   Fibromyalgia  GERD (gastroesophageal reflux disease) 07/25/2019   History of colonic polyps 01/02/2020   Negative colonoscopy performed in November 2021   History of subdural hemorrhage     Hypercholesterolemia 03/01/2021   Hypercoagulable state, secondary 02/15/2009   Immunodeficiency due to drugs 03/01/2021   Major depressive disorder    Migraine headaches    Mild neurocognitive disorder due to multiple etiologies 03/22/2021   Neck pain 08/24/2020   Osteoarthritis (arthritis due to wear and tear of joints) 04/16/2020   PE (pulmonary embolism) 2009   Pelvic pain in female 04/11/2017   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   S/P cholecystectomy 02/15/2009   Spondylosis of lumbar region without myelopathy or radiculopathy 05/10/2016   Subdural hematoma (HCC)    Systemic lupus erythematosus 03/30/2008   scalp skin lesions, on Plaquenil , 2009   Type 2 diabetes mellitus without complication, with long-term current use of insulin  02/27/2014   steroid induced   Upper respiratory tract infection 10/02/2018   Vitamin D  deficiency 05/10/2016   Weakness of right side of body     Family History  Problem Relation Age of Onset   Stroke Mother    Cancer Mother    Cancer Sister    Dementia Maternal Grandmother 53   Cancer Other    Diabetes Other    Dementia Maternal Great-grandmother 68   Pseudochol deficiency Neg Hx    Malignant hyperthermia Neg Hx    Hypotension Neg Hx    Anesthesia problems Neg Hx    Past Surgical History:  Procedure Laterality Date   ABLATION     BRAIN SURGERY     BREAST REDUCTION SURGERY  01/28/2020   Dr. Sonny Ross at [facility]   CHOLECYSTECTOMY     DILATION AND CURETTAGE OF UTERUS     HYSTEROSCOPY  12/30/2019   Dr. Levander   TUBAL LIGATION     Social History   Social History Narrative   Not on file   Immunization History  Administered Date(s) Administered   Influenza Split 01/26/2014   Influenza, Seasonal, Injecte, Preservative Fre 12/05/2010, 12/23/2015   Influenza,inj,Quad PF,6+ Mos 12/05/2017   Influenza-Unspecified 03/30/2008, 11/22/2009, 02/11/2013, 12/18/2013, 11/25/2014   Moderna Sars-Covid-2 Vaccination 09/18/2019, 10/16/2019, 03/18/2020   PPD Test 10/03/2017   Pneumococcal Conjugate-13 02/27/2013   Pneumococcal Polysaccharide-23 04/07/2011   Tdap 09/10/2013     Objective: Vital Signs: There were no vitals taken for this visit.   Physical Exam   Musculoskeletal Exam: ***  CDAI Exam: CDAI Score: -- Patient Global: --; Provider Global: -- Swollen: --; Tender: -- Joint Exam 10/02/2023   No joint exam has been documented for this visit   There is currently no information documented on the homunculus. Go to the Rheumatology activity and complete the homunculus joint exam.  Investigation: No additional findings.  Imaging: No results found.  Recent Labs: Lab Results  Component Value Date   WBC 8.4 05/07/2023   HGB 12.0 05/07/2023   PLT 276 05/07/2023   NA 137 05/07/2023   K  4.3 05/07/2023   CL 105 05/07/2023   CO2 24 05/07/2023   GLUCOSE 129 (H) 05/07/2023   BUN 11 05/07/2023   CREATININE 0.82 05/07/2023   BILITOT 0.5 05/07/2023   ALKPHOS 71 04/04/2022   AST 11 05/07/2023   ALT 13 05/07/2023   PROT 7.4 05/07/2023   ALBUMIN 4.4 04/04/2022   CALCIUM 9.3 05/07/2023   GFRAA 103 06/21/2020    Speciality Comments: PLQ Eye Exam: 05/03/2022 WNL @ [facility] follow up in 1 year  Scheduled for eye exam on 06/14/2023  Procedures:  No procedures performed Allergies: Lacosamide, Peanut oil, Penicillins, Prochlorperazine, Soybean-containing drug  products, Sulfa antibiotics, Wheat, Cetirizine, Ciprofloxacin, Iodinated contrast media, Lisinopril, Naproxen, Nitrofurantoin, Other, Pregabalin, Codeine, Compazine, Gabapentin, Lipitor [atorvastatin], Nitrofurantoin monohyd macro, Sulfasalazine, and Dexamethasone    Assessment / Plan:     Visit Diagnoses: Rheumatoid arthritis involving multiple sites with positive rheumatoid factor (HCC)  High risk medication use  Chronic pain of both shoulders  Primary osteoarthritis of both hands  Fibromyalgia  Trapezius muscle spasm  History of vitamin D  deficiency  Positive PPD  History of pulmonary embolism  History of depression  Moderate persistent asthma with acute exacerbation  History of hypertension  History of psychosis  Orders: No orders of the defined types were placed in this encounter.  No orders of the defined types were placed in this encounter.   Face-to-face time spent with patient was *** minutes. Greater than 50% of time was spent in counseling and coordination of care.  Follow-Up Instructions: No follow-ups on file.   Waddell CHRISTELLA Craze, PA-C  Note - This record has been created using Dragon software.  Chart creation errors have been sought, but may not always  have been located. Such creation errors do not reflect on  the standard of medical care.

## 2023-10-01 ENCOUNTER — Other Ambulatory Visit: Payer: Self-pay | Admitting: Physician Assistant

## 2023-10-01 DIAGNOSIS — M0579 Rheumatoid arthritis with rheumatoid factor of multiple sites without organ or systems involvement: Secondary | ICD-10-CM

## 2023-10-01 DIAGNOSIS — Z79899 Other long term (current) drug therapy: Secondary | ICD-10-CM

## 2023-10-02 ENCOUNTER — Ambulatory Visit: Admitting: Physician Assistant

## 2023-10-02 DIAGNOSIS — G8929 Other chronic pain: Secondary | ICD-10-CM

## 2023-10-02 DIAGNOSIS — Z86711 Personal history of pulmonary embolism: Secondary | ICD-10-CM

## 2023-10-02 DIAGNOSIS — Z79899 Other long term (current) drug therapy: Secondary | ICD-10-CM

## 2023-10-02 DIAGNOSIS — Z8679 Personal history of other diseases of the circulatory system: Secondary | ICD-10-CM

## 2023-10-02 DIAGNOSIS — M19041 Primary osteoarthritis, right hand: Secondary | ICD-10-CM

## 2023-10-02 DIAGNOSIS — Z8639 Personal history of other endocrine, nutritional and metabolic disease: Secondary | ICD-10-CM

## 2023-10-02 DIAGNOSIS — M0579 Rheumatoid arthritis with rheumatoid factor of multiple sites without organ or systems involvement: Secondary | ICD-10-CM

## 2023-10-02 DIAGNOSIS — M797 Fibromyalgia: Secondary | ICD-10-CM

## 2023-10-02 DIAGNOSIS — M62838 Other muscle spasm: Secondary | ICD-10-CM

## 2023-10-02 DIAGNOSIS — Z8659 Personal history of other mental and behavioral disorders: Secondary | ICD-10-CM

## 2023-10-02 DIAGNOSIS — R7611 Nonspecific reaction to tuberculin skin test without active tuberculosis: Secondary | ICD-10-CM

## 2023-10-02 DIAGNOSIS — J4541 Moderate persistent asthma with (acute) exacerbation: Secondary | ICD-10-CM

## 2023-10-02 NOTE — Telephone Encounter (Signed)
 Last Fill: 06/04/2023  Eye exam:  09/25/2023 WNL    Labs: 05/07/2023 Glucose is 129.  Rest of CMP WNL.  CBC stable.     Next Visit: 11/05/2023  Last Visit: 05/07/2023  DX: Rheumatoid arthritis involving multiple sites with positive rheumatoid factor   Current Dose per office note 05/07/2023: Plaquenil  200 mg 1 tablet by mouth twice daily   Okay to refill Plaquenil ?

## 2023-10-22 NOTE — Progress Notes (Unsigned)
 Office Visit Note  Patient: Haley Goodman             Date of Birth: 19-Sep-1980           MRN: [id number]             PCP: Gladystine Erminio CROME, MD Referring: Gladystine Erminio CROME, MD Visit Date: 11/05/2023 Occupation: @GUAROCC @  Subjective:  Pain in both knees  History of Present Illness: Haley Goodman is a 43 y.o. female with history of seropositive rheumatoid arthritis.  Patient remains on plaquenil  200 mg 1 tablet by mouth twice daily.  She is tolerating Plaquenil  without any side effects and has not had any recent gaps in therapy.  Patient reports that over the past few months she has noticed increased pain and crepitus involving both knees.  She has difficulty rising from a seated position as well as climbing steps due to the discomfort in both knees.  She has not noticed any joint swelling consistently.  Patient remains under the care of pain management.  She has been going to water aerobics 2 to 3 days/week which she has found to be helpful at managing her symptoms due to fibromyalgia.  She continues to have chronic pain in her lower back.     Activities of Daily Living:  Patient reports morning stiffness for 20 minutes.   Patient Denies nocturnal pain.  Difficulty dressing/grooming: Denies Difficulty climbing stairs: Reports Difficulty getting out of chair: Denies Difficulty using hands for taps, buttons, cutlery, and/or writing: Reports  Review of Systems  Constitutional:  Negative for fatigue.  HENT:  Negative for mouth sores and mouth dryness.   Eyes:  Positive for dryness.  Respiratory:  Negative for shortness of breath.   Cardiovascular:  Positive for swelling in legs/feet. Negative for chest pain and palpitations.  Gastrointestinal:  Negative for blood in stool, constipation and diarrhea.  Endocrine: Negative for increased urination.  Genitourinary:  Negative for involuntary urination.  Musculoskeletal:  Positive for joint pain, joint pain, joint  swelling, myalgias, morning stiffness and myalgias. Negative for gait problem, muscle weakness and muscle tenderness.  Skin:  Positive for rash. Negative for color change and sensitivity to sunlight.  Allergic/Immunologic: Positive for susceptible to infections.  Neurological:  Negative for dizziness and headaches.  Hematological:  Negative for swollen glands.  Psychiatric/Behavioral:  Negative for depressed mood and sleep disturbance. The patient is not nervous/anxious.     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   History of subdural hemorrhage    Migraine headaches    Weakness of right side of body    Ruptured aneurysm of artery    Hypercholesterolemia 03/01/2021   Immunodeficiency due to drugs 03/01/2021   Neck pain 08/24/2020   Osteoarthritis (arthritis due to wear and tear of joints) 04/16/2020   History of colonic polyps 01/02/2020   Allergic rhinitis 07/25/2019   GERD (gastroesophageal reflux disease) 07/25/2019   Endometritis 07/14/2019   Chronic low back pain 06/11/2019   Dry eyes 09/04/2018   Macromastia 09/04/2018   Degeneration of lumbar intervertebral disc 09/04/2018   Chronic anticoagulation 08/07/2017   Pelvic pain in female 04/11/2017   Major depressive disorder    Fibromyalgia 05/10/2016   Chronic pain syndrome 05/10/2016   High risk medication use/ Plaquenil  05/10/2016   Spondylosis of lumbar region without myelopathy or radiculopathy 05/10/2016   Abnormal serum protein electrophoresis 05/10/2016  Vitamin D  deficiency 05/10/2016   Rheumatoid arthritis involving multiple sites with positive rheumatoid factor    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   Type 2 diabetes mellitus without complication, with long-term current use of insulin  02/27/2014   Obesity 09/10/2013   Alopecia 09/10/2013   Bipolar disorder in partial remission 09/10/2013    Cerebral infarction 09/10/2013   Pulmonary embolism without acute cor pulmonale (HCC) 03/05/2009   Elevated liver enzymes 03/05/2009   Hypercoagulable state, secondary 02/15/2009   S/P cholecystectomy 02/15/2009   Primary insomnia 04/27/2008   Essential hypertension, benign 03/30/2008   Systemic lupus erythematosus 03/30/2008    Past Medical History:  Diagnosis Date   Abnormal serum protein electrophoresis 05/10/2016   Labs from April 23, 2015, SPEP and M-Spike is negative except 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 Related to Lupus, better on Plaquenil    Anemia due to acute blood loss 02/15/2009   Arthritis    Bipolar disorder in partial remission 09/10/2013   Hx psychosis/ hallucinations, last in 2012   Cerebral infarction 09/10/2013   Chronic anticoagulation 08/07/2017   Chronic low back pain 06/11/2019   Chronic pain syndrome 05/10/2016   Elevated liver enzymes 03/05/2009   Essential hypertension, benign 03/30/2008   Fibromyalgia    GERD (gastroesophageal reflux disease) 07/25/2019   History of colonic polyps 01/02/2020   Negative colonoscopy performed in November 2021   History of subdural hemorrhage    Hypercholesterolemia 03/01/2021   Hypercoagulable state, secondary 02/15/2009   Immunodeficiency due to drugs 03/01/2021   Major depressive disorder    Migraine headaches    Mild neurocognitive disorder due to multiple etiologies 03/22/2021   Neck pain 08/24/2020   Osteoarthritis (arthritis due to wear and tear of joints) 04/16/2020   PE (pulmonary embolism) 2009   Pelvic pain in female 04/11/2017   Polyclonal gammopathy  05/26/2015   Elevated IgA with kappa and lambda polyclonal immunoglobulins   Primary insomnia 04/27/2008   Pulmonary embolism without acute cor pulmonale 03/05/2009   Lifelong coumadin  Anticoagulation; IMOLOAD 2017 R1.1   Rheumatoid arthritis involving multiple sites with positive rheumatoid factor    Positive RF and positive anti-CCP  Formatting of this note might be different from the original. 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   S/P cholecystectomy 02/15/2009   Spondylosis of lumbar region without myelopathy or radiculopathy 05/10/2016   Subdural hematoma (HCC)    Systemic lupus erythematosus 03/30/2008   scalp skin lesions, on Plaquenil , 2009   Type 2 diabetes mellitus without complication, with long-term current use of insulin  02/27/2014   steroid induced   Upper respiratory tract infection 10/02/2018   Vitamin D  deficiency 05/10/2016   Weakness of right side of body     Family History  Problem Relation Age of Onset   Stroke Mother    Cancer Mother    Cancer Sister    Dementia Maternal Grandmother 21   Cancer Other    Diabetes Other    Dementia Maternal Great-grandmother 97   Pseudochol deficiency Neg Hx    Malignant hyperthermia Neg  Hx    Hypotension Neg Hx    Anesthesia problems Neg Hx    Past Surgical History:  Procedure Laterality Date   ABLATION     BRAIN SURGERY     BREAST REDUCTION SURGERY  01/28/2020   Dr. Sonny [facility] at [facility]   CHOLECYSTECTOMY     DILATION AND CURETTAGE OF UTERUS     HYSTEROSCOPY  12/30/2019   Dr. Levander   TUBAL LIGATION     Social History   Social History Narrative   Not on file   Immunization History  Administered Date(s) Administered   Influenza Split 01/26/2014   Influenza, Seasonal, Injecte, Preservative Fre 12/05/2010, 12/23/2015   Influenza,inj,Quad PF,6+ Mos 12/05/2017   Influenza-Unspecified 03/30/2008,  11/22/2009, 02/11/2013, 12/18/2013, 11/25/2014   Moderna Sars-Covid-2 Vaccination 09/18/2019, 10/16/2019, 03/18/2020   PPD Test 10/03/2017   Pneumococcal Conjugate-13 02/27/2013   Pneumococcal Polysaccharide-23 04/07/2011   Tdap 09/10/2013     Objective: Vital Signs: BP 137/88 (BP Location: Left Arm, Patient Position: Sitting, Cuff Size: Large)   Pulse 68   Resp 15   Ht 5' 7 (1.702 m)   Wt 235 lb 12.8 oz (107 kg)   BMI 36.93 kg/m    Physical Exam Vitals and nursing note reviewed.  Constitutional:      Appearance: She is well-developed.  HENT:     Head: Normocephalic and atraumatic.  Eyes:     Conjunctiva/sclera: Conjunctivae normal.  Cardiovascular:     Rate and Rhythm: Normal rate and regular rhythm.     Heart sounds: Normal heart sounds.  Pulmonary:     Effort: Pulmonary effort is normal.     Breath sounds: Normal breath sounds.  Abdominal:     General: Bowel sounds are normal.     Palpations: Abdomen is soft.  Musculoskeletal:     Cervical back: Normal range of motion.  Lymphadenopathy:     Cervical: No cervical adenopathy.  Skin:    General: Skin is warm and dry.     Capillary Refill: Capillary refill takes less than 2 seconds.  Neurological:     Mental Status: She is alert and oriented to person, place, and time.  Psychiatric:        Behavior: Behavior normal.      Musculoskeletal Exam: C-spine has slightly limited ROM with lateral rotation. Limited mobility of lumbar spine. Shoulder joints, elbow joints, wrist joints, MCPs, PIPs, DIPs have good range of motion with no synovitis.  Complete fist formation bilaterally.  Hip joints have good range of motion with no groin pain.  Knee joints have good range of motion with crepitus in the left knee.  No warmth or effusion noted.  Ankle joints have good range of motion with no tenderness or joint swelling.  CDAI Exam: CDAI Score: -- Patient Global: --; Provider Global: -- Swollen: --; Tender: -- Joint Exam  11/05/2023   No joint exam has been documented for this visit   There is currently no information documented on the homunculus. Go to the Rheumatology activity and complete the homunculus joint exam.  Investigation: No additional findings.  Imaging: No results found.  Recent Labs: Lab Results  Component Value Date   WBC 8.4 05/07/2023   HGB 12.0 05/07/2023   PLT 276 05/07/2023   NA 137 05/07/2023   K 4.3 05/07/2023   CL 105 05/07/2023   CO2 24 05/07/2023   GLUCOSE 129 (H) 05/07/2023   BUN 11 05/07/2023   CREATININE 0.82 05/07/2023   BILITOT 0.5 05/07/2023  ALKPHOS 71 04/04/2022   AST 11 05/07/2023   ALT 13 05/07/2023   PROT 7.4 05/07/2023   ALBUMIN 4.4 04/04/2022   CALCIUM 9.3 05/07/2023   GFRAA 103 06/21/2020    Speciality Comments: PLQ Eye Exam: 09/25/2023 WNL @ [facility] follow up in 1 year  Scheduled for eye exam on 06/14/2023  Procedures:  No procedures performed Allergies: Lacosamide, Peanut oil, Penicillins, Prochlorperazine, Soybean-containing drug products, Sulfa antibiotics, Wheat, Cetirizine, Ciprofloxacin, Iodinated contrast media, Lisinopril, Naproxen, Nitrofurantoin, Other, Pregabalin, Codeine, Compazine, Gabapentin, Lipitor [atorvastatin], Nitrofurantoin monohyd macro, Pantoprazole, Statins, Sulfasalazine, and Dexamethasone    Assessment / Plan:     Visit Diagnoses: Rheumatoid arthritis involving multiple sites with positive rheumatoid factor (HCC) - She has no synovitis on examination today.  She has not had any signs or symptoms of a rheumatoid arthritis flare.  She has clinically been doing well taking Plaquenil  200 mg 1 tablet by mouth twice daily.  She is tolerating Plaquenil  without any side effects and has not had any gaps in therapy.  No medication changes will be made at this time.  She was advised to notify us  if she develops signs or symptoms of a flare.  She will follow-up in the office in 5 months or sooner if needed.  Plan: XR KNEE  3 VIEW RIGHT, XR KNEE 3 VIEW LEFT, XR Hand 2 View Right, XR Hand 2 View Left, XR Foot 2 Views Left, XR Foot 2 Views Right  High risk medication use - Plaquenil  200 mg 1 tablet by mouth twice daily.  CBC and CMP updated on 05/07/23.  PLQ Eye Exam: 09/25/2023 WNL @ [facility] follow up in 1 year.   - Plan: CBC with Differential/Platelet, Comprehensive metabolic panel with GFR  Chronic pain of both shoulders: She has good range of motion of both shoulder joints with no discomfort currently.  Primary osteoarthritis of both hands - She has no tenderness or synovitis.  Plan to update x-rays of both hands to assess for radiographic progression.  Plan: XR Hand 2 View Right, XR Hand 2 View Left  Chronic pain of both knees -She has been experiencing increased pain and stiffness in both knee joints.  She has had some increased difficulty climbing steps and rising from a seated position.  She is also noticed increased popping and cracking in both knees.  On examination she has good range of motion of both knee joints with crepitus in the left knee.  No warmth or effusion noted.  No instability noted.  X-rays of both knees were obtained on 08/24/2016.  Plan to update x-rays today for further evaluation.  Plan: XR KNEE 3 VIEW RIGHT, XR KNEE 3 VIEW LEFT  Pain in both feet -X-rays of both feet updated today to assess for radiographic progression.   Plan: XR Foot 2 Views Left, XR Foot 2 Views Right  Fibromyalgia: Her symptoms due to fibromyalgia have improved since going to water aerobics 2 to 3 days/week.  Discussed the importance of regular exercise and good sleep hygiene.  Trapezius muscle spasm: She experiences some stiffness with lateral rotation due to trapezius muscle tension.   Other medical conditions are listed as follows:  History of vitamin D  deficiency  Positive PPD  History of pulmonary embolism  History of depression  Moderate persistent asthma with acute exacerbation  History  of hypertension: Blood pressure was 137/88 today in the office.   Orders: Orders Placed This Encounter  Procedures   XR KNEE 3  VIEW RIGHT   XR KNEE 3 VIEW LEFT   XR Hand 2 View Right   XR Hand 2 View Left   XR Foot 2 Views Left   XR Foot 2 Views Right   CBC with Differential/Platelet   Comprehensive metabolic panel with GFR   No orders of the defined types were placed in this encounter.    Follow-Up Instructions: Return in about 5 months (around 04/06/2024) for Rheumatoid arthritis.   Waddell CHRISTELLA Craze, PA-C  Note - This record has been created using Dragon software.  Chart creation errors have been sought, but may not always  have been located. Such creation errors do not reflect on  the standard of medical care.

## 2023-10-26 ENCOUNTER — Other Ambulatory Visit: Payer: Self-pay | Admitting: Allergy & Immunology

## 2023-10-30 ENCOUNTER — Ambulatory Visit: Admitting: Allergy & Immunology

## 2023-11-04 NOTE — Patient Instructions (Incomplete)
 1. Seasonal and perennial allergic rhinitis-controlled - Previous testing showed: grasses, ragweed, weeds, trees, outdoor molds, dust mites, cat, dog, and cockroach. - Continue taking: Allegra  (fexofenadine ) 1-2 tablets once daily and Singulair  (montelukast ) 10mg  daily - Continue taking: Flonase  one spray per nostril up to twice daily as needed - You can use an extra dose of the antihistamine, if needed, for breakthrough symptoms.  - Consider nasal saline rinses 1-2 times daily to remove allergens from the nasal cavities as well as help with mucous clearance (this is especially helpful to do before the nasal sprays are given) - I think we can hold off on allergy shots as a means of long-term control.  2. Food intolerance-stable - Continue to avoid peanuts and green peas and soy. - Patient interested to see if Neffy  is covered by her insurance. Demonstration given  3. Gastroesophageal reflux disease: Controlled - Continue with Nexium  40mg  twice daily.  - Continue Pepcid once a day as per your primary care physician  4. Recurrent infections  - Previous immune work up was normal.   5. Moderate persistent asthma, uncomplicated-moderately controlled.  Has been out of Trelegy for almost 3 weeks now - Daily controller medication(s): Trelegy 200/62.5/25 one puff once daily + Fasenra  every month x 3 doses (then every 8 weeks thereafter) sample of Trelegy given - Prior to physical activity: albuterol  2 puffs 10-15 minutes before physical activity. - Rescue medications: albuterol  4 puffs every 4-6 hours as needed and DuoNeb nebulizer one vial every 4-6 hours as needed - Asthma control goals:  * Full participation in all desired activities (may need albuterol  before activity) * Albuterol  use two time or less a week on average (not counting use with activity) * Cough interfering with sleep two time or less a month * Oral steroids no more than once a year * No hospitalizations  Recommend scheduling  an appointment with your ENT to discuss both the ears draining 6. Follow up in 6 months or sooner if needed   Please inform us  of any Emergency Department visits, hospitalizations, or changes in symptoms. Call us  before going to the ED for breathing or allergy symptoms since we might be able to fit you in for a sick visit. Feel free to contact us  anytime with any questions, problems, or concerns.   Websites that have reliable patient information: 1. American Academy of Asthma, Allergy, and Immunology: www.aaaai.org 2. Food Allergy Research and Education (FARE): foodallergy.org 3. Mothers of Asthmatics: http://www.asthmacommunitynetwork.org 4. American College of Allergy, Asthma, and Immunology: www.acaai.org

## 2023-11-05 ENCOUNTER — Ambulatory Visit: Attending: Physician Assistant | Admitting: Physician Assistant

## 2023-11-05 ENCOUNTER — Ambulatory Visit (INDEPENDENT_AMBULATORY_CARE_PROVIDER_SITE_OTHER): Admitting: Family

## 2023-11-05 ENCOUNTER — Ambulatory Visit

## 2023-11-05 ENCOUNTER — Encounter: Payer: Self-pay | Admitting: Physician Assistant

## 2023-11-05 ENCOUNTER — Encounter: Payer: Self-pay | Admitting: Family

## 2023-11-05 ENCOUNTER — Ambulatory Visit (INDEPENDENT_AMBULATORY_CARE_PROVIDER_SITE_OTHER)

## 2023-11-05 VITALS — BP 130/80 | HR 74 | Temp 98.6°F | Ht 67.0 in | Wt 234.7 lb

## 2023-11-05 VITALS — BP 137/88 | HR 68 | Resp 15 | Ht 67.0 in | Wt 235.8 lb

## 2023-11-05 DIAGNOSIS — M25561 Pain in right knee: Secondary | ICD-10-CM

## 2023-11-05 DIAGNOSIS — M797 Fibromyalgia: Secondary | ICD-10-CM

## 2023-11-05 DIAGNOSIS — J302 Other seasonal allergic rhinitis: Secondary | ICD-10-CM | POA: Diagnosis not present

## 2023-11-05 DIAGNOSIS — M79672 Pain in left foot: Secondary | ICD-10-CM | POA: Diagnosis not present

## 2023-11-05 DIAGNOSIS — R7611 Nonspecific reaction to tuberculin skin test without active tuberculosis: Secondary | ICD-10-CM

## 2023-11-05 DIAGNOSIS — M25562 Pain in left knee: Secondary | ICD-10-CM

## 2023-11-05 DIAGNOSIS — M25512 Pain in left shoulder: Secondary | ICD-10-CM

## 2023-11-05 DIAGNOSIS — M79671 Pain in right foot: Secondary | ICD-10-CM

## 2023-11-05 DIAGNOSIS — G8929 Other chronic pain: Secondary | ICD-10-CM | POA: Diagnosis not present

## 2023-11-05 DIAGNOSIS — M62838 Other muscle spasm: Secondary | ICD-10-CM

## 2023-11-05 DIAGNOSIS — K9049 Malabsorption due to intolerance, not elsewhere classified: Secondary | ICD-10-CM | POA: Diagnosis not present

## 2023-11-05 DIAGNOSIS — Z86711 Personal history of pulmonary embolism: Secondary | ICD-10-CM

## 2023-11-05 DIAGNOSIS — M0579 Rheumatoid arthritis with rheumatoid factor of multiple sites without organ or systems involvement: Secondary | ICD-10-CM

## 2023-11-05 DIAGNOSIS — K219 Gastro-esophageal reflux disease without esophagitis: Secondary | ICD-10-CM

## 2023-11-05 DIAGNOSIS — M19042 Primary osteoarthritis, left hand: Secondary | ICD-10-CM

## 2023-11-05 DIAGNOSIS — J3089 Other allergic rhinitis: Secondary | ICD-10-CM

## 2023-11-05 DIAGNOSIS — J4540 Moderate persistent asthma, uncomplicated: Secondary | ICD-10-CM

## 2023-11-05 DIAGNOSIS — M19041 Primary osteoarthritis, right hand: Secondary | ICD-10-CM | POA: Diagnosis not present

## 2023-11-05 DIAGNOSIS — Z8679 Personal history of other diseases of the circulatory system: Secondary | ICD-10-CM

## 2023-11-05 DIAGNOSIS — Z79899 Other long term (current) drug therapy: Secondary | ICD-10-CM | POA: Diagnosis not present

## 2023-11-05 DIAGNOSIS — Z8639 Personal history of other endocrine, nutritional and metabolic disease: Secondary | ICD-10-CM

## 2023-11-05 DIAGNOSIS — M25511 Pain in right shoulder: Secondary | ICD-10-CM | POA: Diagnosis not present

## 2023-11-05 DIAGNOSIS — J4541 Moderate persistent asthma with (acute) exacerbation: Secondary | ICD-10-CM

## 2023-11-05 DIAGNOSIS — B999 Unspecified infectious disease: Secondary | ICD-10-CM | POA: Diagnosis not present

## 2023-11-05 DIAGNOSIS — Z8659 Personal history of other mental and behavioral disorders: Secondary | ICD-10-CM

## 2023-11-05 LAB — CBC WITH DIFFERENTIAL/PLATELET
Absolute Lymphocytes: 3093 {cells}/uL (ref 850–3900)
Absolute Monocytes: 540 {cells}/uL (ref 200–950)
Basophils Absolute: 30 {cells}/uL (ref 0–200)
Basophils Relative: 0.4 %
Eosinophils Absolute: 148 {cells}/uL (ref 15–500)
Eosinophils Relative: 2 %
HCT: 37.7 % (ref 35.0–45.0)
Hemoglobin: 11.7 g/dL (ref 11.7–15.5)
MCH: 25.9 pg — ABNORMAL LOW (ref 27.0–33.0)
MCHC: 31 g/dL — ABNORMAL LOW (ref 32.0–36.0)
MCV: 83.6 fL (ref 80.0–100.0)
MPV: 10.9 fL (ref 7.5–12.5)
Monocytes Relative: 7.3 %
Neutro Abs: 3589 {cells}/uL (ref 1500–7800)
Neutrophils Relative %: 48.5 %
Platelets: 274 Thousand/uL (ref 140–400)
RBC: 4.51 Million/uL (ref 3.80–5.10)
RDW: 13.1 % (ref 11.0–15.0)
Total Lymphocyte: 41.8 %
WBC: 7.4 Thousand/uL (ref 3.8–10.8)

## 2023-11-05 LAB — COMPREHENSIVE METABOLIC PANEL WITH GFR
AG Ratio: 1.4 (calc) (ref 1.0–2.5)
ALT: 20 U/L (ref 6–29)
AST: 16 U/L (ref 10–35)
Albumin: 4.1 g/dL (ref 3.6–5.1)
Alkaline phosphatase (APISO): 64 U/L (ref 31–125)
BUN: 9 mg/dL (ref 7–25)
CO2: 27 mmol/L (ref 20–32)
Calcium: 8.9 mg/dL (ref 8.6–10.2)
Chloride: 102 mmol/L (ref 98–110)
Creat: 0.8 mg/dL (ref 0.50–0.99)
Globulin: 2.9 g/dL (ref 1.9–3.7)
Glucose, Bld: 154 mg/dL — ABNORMAL HIGH (ref 65–99)
Potassium: 4.3 mmol/L (ref 3.5–5.3)
Sodium: 137 mmol/L (ref 135–146)
Total Bilirubin: 0.3 mg/dL (ref 0.2–1.2)
Total Protein: 7 g/dL (ref 6.1–8.1)
eGFR: 93 mL/min/1.73m2 (ref >=60)

## 2023-11-05 MED ORDER — FLUTICASONE PROPIONATE 50 MCG/ACT NA SUSP: NASAL | 1 refills | Status: AC

## 2023-11-05 MED ORDER — FEXOFENADINE HCL 180 MG PO TABS: 180.0000 mg | ORAL_TABLET | Freq: Every day | ORAL | 1 refills | Status: AC

## 2023-11-05 MED ORDER — ALBUTEROL SULFATE HFA 108 (90 BASE) MCG/ACT IN AERS: INHALATION_SPRAY | RESPIRATORY_TRACT | 1 refills | Status: AC

## 2023-11-05 MED ORDER — NEFFY 2 MG/0.1ML NA SOLN: 1.0000 | NASAL | 1 refills | Status: DC | PRN

## 2023-11-05 MED ORDER — TRELEGY ELLIPTA 200-62.5-25 MCG/ACT IN AEPB: INHALATION_SPRAY | RESPIRATORY_TRACT | 5 refills | Status: AC

## 2023-11-05 MED ORDER — MONTELUKAST SODIUM 10 MG PO TABS: 10.0000 mg | ORAL_TABLET | Freq: Every day | ORAL | 1 refills | Status: AC

## 2023-11-05 NOTE — Addendum Note (Signed)
 Addended by: NANCEE JON SAILOR on: 11/05/2023 05:21 PM   Modules accepted: Orders

## 2023-11-05 NOTE — Progress Notes (Cosign Needed Addendum)
 [address] [place] [address] Dept: [phone]  FOLLOW UP NOTE  Patient ID: Haley Goodman, female    DOB: Dec 26, 1980  Age: 43 y.o. MRN: [id number] Date of Office Visit: 11/05/2023  Assessment  Chief Complaint: Follow-up (Allergies/Asthma/No concerns)  HPI Haley Goodman is a 43 year old female who presents today for follow-up of seasonal and perennial allergic rhinitis, food intolerance, gastroesophageal reflux disease, recurrent infections, moderate persistent asthma, and recurrent prednisone  use with multiple complications.  She was last seen on December 19, 2022 by Dr. Iva.  She denies any new diagnosis or surgery since her last office visit.  Seasonal and perennial allergic rhinitis: She reports that her Fasenra  shot is working well and helping with her allergies.  She reports when it is time for her next shot she will be itchy and have congestion.  She denies rhinorrhea, nasal congestion, and postnasal drip.  She reports maybe 1 sinus infection in the end of this winter.  She takes fexofenadine  1 tablet twice a day and Singulair  10 mg at night.  She uses fluticasone  nasal spray as needed.  Food intolerance: She continues to avoid peanuts, green peas, and soy without any accidental ingestion or use of her epinephrine  autoinjector device.  She would like to see if her insurance covers Neffy .  She reports that avoiding these food has made a big difference.  Gastroesophageal reflux disease.  She continues to take Nexium  40 mg twice a day and Pepcid at night as per her primary care physician.  She denies any heartburn or reflux symptoms.  Recurrent infections.  She reports that she has only had 1 sinus infection and no other infections since her last office visit.  Moderate persistent asthma: She reports that she has been out of Trelegy 200 mcg 1 puff once a day for almost 3 weeks now.  She does continue to receive Fasenra  injections per protocol and has albuterol   to use as needed.  She reports since being out of Trelegy she will have shortness of breath if she is going up steps when helping her grandmother.  She also reports if it is hot and humid she may have symptoms.  She denies cough, wheeze, tightness in chest, and nocturnal awakenings due to breathing problems.  Since her last office visit she has not made any trips to the emergency room or urgent care due to breathing problems.  She reports that she had 1 round of steroids for COVID this winter.  She has been out of Trelegy she reports that if it is hot or humid outside she may use her albuterol  2 times a day, but if it is not hot or humid she can go couple weeks without using her albuterol .   Drug Allergies:  Allergies  Allergen Reactions   Lacosamide Hives and Rash   Peanut Oil Hives   Penicillins Hives and Anaphylaxis   Prochlorperazine Itching   Soybean-Containing Drug Products Hives   Sulfa Antibiotics Anaphylaxis and Swelling   Wheat Itching    & green peas   Cetirizine Other (See Comments)   Ciprofloxacin Other (See Comments)    Throat closing sensation   Iodinated Contrast Media Nausea Only, Other (See Comments) and Nausea And Vomiting    Other reaction(s): GI intolerance   Lisinopril Swelling    Tongue swelling   Naproxen Other (See Comments)   Nitrofurantoin Hives   Other Other (See Comments)   Pregabalin Other (See Comments)   Codeine     GI intolerance per patient  Compazine     Not in right state of mind.    Gabapentin    Lipitor [Atorvastatin]     Flu like symptoms, throat swelling.    Nitrofurantoin Monohyd Macro Hives and Swelling   Pantoprazole     Facial twitching   Statins     SIDE EFFECTS    Sulfasalazine Hives   Dexamethasone  Rash    Review of Systems: Negative except as per HPI   Physical Exam: BP 130/80   Pulse 74   Temp 98.6 F (37 C)   Ht 5' 7 (1.702 m)   Wt 234 lb 11.2 oz (106.5 kg)   BMI 36.76 kg/m    Physical Exam Constitutional:       Appearance: Normal appearance.  HENT:     Head: Normocephalic and atraumatic.     Comments: Pharynx normal, eyes normal, ears: right ear normal, left ear: blue PE tube noted. Nose normal    Right Ear: Tympanic membrane, ear canal and external ear normal.     Left Ear: Ear canal and external ear normal.     Nose: Nose normal.     Mouth/Throat:     Mouth: Mucous membranes are moist.     Pharynx: Oropharynx is clear.  Eyes:     Conjunctiva/sclera: Conjunctivae normal.  Cardiovascular:     Rate and Rhythm: Regular rhythm.     Heart sounds: Normal heart sounds.  Pulmonary:     Effort: Pulmonary effort is normal.     Breath sounds: Normal breath sounds.     Comments: Lungs clear to auscultation Musculoskeletal:     Cervical back: Neck supple.  Skin:    General: Skin is warm.  Neurological:     Mental Status: She is alert and oriented to person, place, and time.  Psychiatric:        Mood and Affect: Mood normal.        Behavior: Behavior normal.        Thought Content: Thought content normal.        Judgment: Judgment normal.     Diagnostics: FVC 2.50 L (73%), FEV1 2.08 L (75%), FEV1/FVC 0.83.  Spirometry indicates possible restrictive defect.  Assessment and Plan: 1. Seasonal and perennial allergic rhinitis   2. Moderate persistent asthma, uncomplicated   3. Recurrent infections   4. Food intolerance   5. Gastroesophageal reflux disease, unspecified whether esophagitis present     Meds ordered this encounter  Medications   albuterol  (VENTOLIN  HFA) 108 (90 Base) MCG/ACT inhaler    Sig: 2 puffs every 4-6 hours as needed for cough, wheeze, tightness in chest, or shortness of breath    Dispense:  18 g    Refill:  1    Courtesy. Needs ov.   fexofenadine  (ALLERGY RELIEF) 180 MG tablet    Sig: Take 1 tablet (180 mg total) by mouth daily.    Dispense:  180 tablet    Refill:  1   fluticasone  (FLONASE ) 50 MCG/ACT nasal spray    Sig: Place 1 to 2 sprays in each nostril  once a day as needed for stuffy nose    Dispense:  48 g    Refill:  1    ZERO refills remain on this prescription. Your patient is requesting advance approval of refills for this medication to PREVENT ANY MISSED DOSES   Fluticasone -Umeclidin-Vilant (TRELEGY ELLIPTA ) 200-62.5-25 MCG/ACT AEPB    Sig: Take 1 inhalation once a day to help prevent cough and wheeze.  Rinse mouth  out afterwards    Dispense:  28 each    Refill:  5    Patient is due for a 6 month follow up in March   montelukast  (SINGULAIR ) 10 MG tablet    Sig: Take 1 tablet (10 mg total) by mouth at bedtime.    Dispense:  90 tablet    Refill:  1   EPINEPHrine  (NEFFY ) 2 MG/0.1ML SOLN    Sig: Place 1 spray into the nose as needed (Use as needed for anaphylactic reaction).    Dispense:  6 each    Refill:  1    [phone]    Patient Instructions  1. Seasonal and perennial allergic rhinitis-controlled - Previous testing showed: grasses, ragweed, weeds, trees, outdoor molds, dust mites, cat, dog, and cockroach. - Continue taking: Allegra  (fexofenadine ) 1-2 tablets once daily and Singulair  (montelukast ) 10mg  daily - Continue taking: Flonase  one spray per nostril up to twice daily as needed - You can use an extra dose of the antihistamine, if needed, for breakthrough symptoms.  - Consider nasal saline rinses 1-2 times daily to remove allergens from the nasal cavities as well as help with mucous clearance (this is especially helpful to do before the nasal sprays are given) - I think we can hold off on allergy shots as a means of long-term control.  2. Food intolerance-stable - Continue to avoid peanuts and green peas and soy. - Patient interested to see if Neffy  is covered by her insurance. Demonstration given  3. Gastroesophageal reflux disease: Controlled - Continue with Nexium  40mg  twice daily.  - Continue Pepcid once a day as per your primary care physician  4. Recurrent infections  - Previous immune work up was normal.    5. Moderate persistent asthma, uncomplicated-moderately controlled.  Has been out of Trelegy for almost 3 weeks now - Daily controller medication(s): Trelegy 200/62.5/25 one puff once daily + Fasenra  every month x 3 doses (then every 8 weeks thereafter) sample of Trelegy given - Prior to physical activity: albuterol  2 puffs 10-15 minutes before physical activity. - Rescue medications: albuterol  4 puffs every 4-6 hours as needed and DuoNeb nebulizer one vial every 4-6 hours as needed - Asthma control goals:  * Full participation in all desired activities (may need albuterol  before activity) * Albuterol  use two time or less a week on average (not counting use with activity) * Cough interfering with sleep two time or less a month * Oral steroids no more than once a year * No hospitalizations  Recommend scheduling an appointment with your ENT to discuss both the ears draining 6. Follow up in 6 months or sooner if needed   Please inform us  of any Emergency Department visits, hospitalizations, or changes in symptoms. Call us  before going to the ED for breathing or allergy symptoms since we might be able to fit you in for a sick visit. Feel free to contact us  anytime with any questions, problems, or concerns.   Websites that have reliable patient information: 1. American Academy of Asthma, Allergy, and Immunology: www.aaaai.org 2. Food Allergy Research and Education (FARE): foodallergy.org 3. Mothers of Asthmatics: http://www.asthmacommunitynetwork.org 4. American College of Allergy, Asthma, and Immunology: www.acaai.org    Return in about 6 months (around 05/04/2024), or if symptoms worsen or fail to improve.    Thank you for the opportunity to care for this patient.  Please do not hesitate to contact me with questions.  Wanda Craze, FNP [facility] 

## 2023-11-06 ENCOUNTER — Ambulatory Visit: Payer: Self-pay | Admitting: Physician Assistant

## 2023-11-06 NOTE — Progress Notes (Signed)
 MCH and MCHC remain low. Rest of CBC WNL.  We will continue to monitor Glucose is 154, rest of CMP WNL.

## 2023-11-06 NOTE — Progress Notes (Signed)
 X-rays of both hands were consistent with rheumatoid arthritis and osteoarthritis overlap.  No radiographic progression was noted when compared to x-rays from 2021.  No erosive changes noted.  X-rays of both knees were consistent with moderate osteoarthritis and moderate chondromalacia patella.  X-rays of both feet were consistent with osteoarthritis.  No erosive changes noted.  No radiographic progression noted.  Please notify the patient.

## 2023-11-08 ENCOUNTER — Other Ambulatory Visit: Payer: Self-pay | Admitting: Allergy & Immunology

## 2023-11-09 ENCOUNTER — Telehealth: Payer: Self-pay

## 2023-11-09 MED ORDER — EPINEPHRINE 0.3 MG/0.3ML IJ SOAJ: 0.3000 mg | INTRAMUSCULAR | 1 refills | Status: AC | PRN

## 2023-11-09 MED ORDER — EPINEPHRINE 0.3 MG/0.3ML IJ SOAJ: 0.3000 mg | INTRAMUSCULAR | 1 refills | Status: DC | PRN

## 2023-11-09 NOTE — Addendum Note (Signed)
 Addended by: MENDEZ-MUNGARAY, Cella Cappello M on: 11/09/2023 05:12 PM   Modules accepted: Orders

## 2023-11-09 NOTE — Telephone Encounter (Signed)
*  AA  Pharmacy Patient Advocate Encounter   Received notification from CoverMyMeds that prior authorization for Neffy  2MG /0.1ML solution  is required/requested.   Insurance verification completed.   The patient is insured through [facility] .   Per test claim:  Injectable Epinephrine  is preferred by the insurance.  If suggested medication is appropriate, Please send in a new RX and discontinue this one. If not, please advise as to why it's not appropriate so that we may request a Prior Authorization. Please note, some preferred medications may still require a PA.  If the suggested medications have not been trialed and there are no contraindications to their use, the PA will not be submitted, as it will not be approved.   CMM Key: [id number]

## 2023-11-09 NOTE — Telephone Encounter (Signed)
 Patient requested Neffy  if her insurance will cover it. Please try for PA

## 2023-11-09 NOTE — Telephone Encounter (Signed)
 I called the patient and informed. It looks like it was initially sent into [facility] in [place]. She requested it sent to [facility] in [place].

## 2023-11-09 NOTE — Telephone Encounter (Signed)
 Thank you for the update. It looks like someone has already sent in a prescription for epinephrine . Please update the patient on her insurance not covering Neffy  and EpiPen  being sent in.

## 2023-11-09 NOTE — Addendum Note (Signed)
 Addended by: FRANCIS ROULEAU A on: 11/09/2023 09:45 AM   Modules accepted: Orders

## 2024-04-10 ENCOUNTER — Ambulatory Visit: Admitting: Rheumatology

## 2024-06-06 ENCOUNTER — Ambulatory Visit: Admitting: Physician Assistant
